# Patient Record
Sex: Male | Born: 1940
Health system: Southern US, Community
[De-identification: ages and names within clinical notes are randomized; demographics above are authoritative.]

## PROBLEM LIST (undated history)

## (undated) DIAGNOSIS — E785 Hyperlipidemia, unspecified: Secondary | ICD-10-CM

## (undated) DIAGNOSIS — Z87442 Personal history of urinary calculi: Secondary | ICD-10-CM

## (undated) DIAGNOSIS — K5790 Diverticulosis of intestine, part unspecified, without perforation or abscess without bleeding: Secondary | ICD-10-CM

## (undated) DIAGNOSIS — N189 Chronic kidney disease, unspecified: Secondary | ICD-10-CM

## (undated) DIAGNOSIS — G20 Parkinson's disease: Secondary | ICD-10-CM

## (undated) DIAGNOSIS — I2510 Atherosclerotic heart disease of native coronary artery without angina pectoris: Secondary | ICD-10-CM

## (undated) DIAGNOSIS — I10 Essential (primary) hypertension: Secondary | ICD-10-CM

## (undated) DIAGNOSIS — I454 Nonspecific intraventricular block: Secondary | ICD-10-CM

## (undated) DIAGNOSIS — F0390 Unspecified dementia without behavioral disturbance: Secondary | ICD-10-CM

## (undated) DIAGNOSIS — M1990 Unspecified osteoarthritis, unspecified site: Secondary | ICD-10-CM

## (undated) DIAGNOSIS — K219 Gastro-esophageal reflux disease without esophagitis: Secondary | ICD-10-CM

## (undated) DIAGNOSIS — R5382 Chronic fatigue, unspecified: Secondary | ICD-10-CM

## (undated) DIAGNOSIS — J439 Emphysema, unspecified: Secondary | ICD-10-CM

## (undated) DIAGNOSIS — R5381 Other malaise: Secondary | ICD-10-CM

## (undated) DIAGNOSIS — S060X9A Concussion with loss of consciousness of unspecified duration, initial encounter: Secondary | ICD-10-CM

## (undated) DIAGNOSIS — K449 Diaphragmatic hernia without obstruction or gangrene: Secondary | ICD-10-CM

## (undated) DIAGNOSIS — C801 Malignant (primary) neoplasm, unspecified: Secondary | ICD-10-CM

## (undated) DIAGNOSIS — I219 Acute myocardial infarction, unspecified: Secondary | ICD-10-CM

## (undated) DIAGNOSIS — R634 Abnormal weight loss: Secondary | ICD-10-CM

## (undated) DIAGNOSIS — F419 Anxiety disorder, unspecified: Secondary | ICD-10-CM

## (undated) DIAGNOSIS — G20A1 Parkinson's disease without dyskinesia, without mention of fluctuations: Secondary | ICD-10-CM

## (undated) HISTORY — PX: OTHER SURGICAL HISTORY: SHX169

## (undated) HISTORY — PX: CORONARY ARTERY BYPASS GRAFT: SHX141

## (undated) HISTORY — PX: LITHOTRIPSY: SUR834

## (undated) HISTORY — PX: PILONIDAL CYST / SINUS EXCISION: SUR543

## (undated) HISTORY — PX: CARDIAC CATHETERIZATION: SHX172

## (undated) HISTORY — PX: KIDNEY SURGERY: SHX687

---

## 1991-03-23 DIAGNOSIS — S060X9A Concussion with loss of consciousness of unspecified duration, initial encounter: Secondary | ICD-10-CM

## 1991-03-23 DIAGNOSIS — S060XAA Concussion with loss of consciousness status unknown, initial encounter: Secondary | ICD-10-CM

## 1991-03-23 HISTORY — DX: Concussion with loss of consciousness status unknown, initial encounter: S06.0XAA

## 1991-03-23 HISTORY — DX: Concussion with loss of consciousness of unspecified duration, initial encounter: S06.0X9A

## 2000-03-24 ENCOUNTER — Ambulatory Visit (HOSPITAL_COMMUNITY): Admission: RE | Admit: 2000-03-24 | Discharge: 2000-03-24 | Payer: Self-pay | Admitting: Family Medicine

## 2000-03-24 ENCOUNTER — Encounter: Payer: Self-pay | Admitting: Family Medicine

## 2000-05-27 ENCOUNTER — Ambulatory Visit (HOSPITAL_COMMUNITY): Admission: RE | Admit: 2000-05-27 | Discharge: 2000-05-27 | Payer: Self-pay | Admitting: Family Medicine

## 2000-05-27 ENCOUNTER — Encounter: Payer: Self-pay | Admitting: Family Medicine

## 2001-11-11 ENCOUNTER — Encounter: Payer: Self-pay | Admitting: Emergency Medicine

## 2001-11-11 ENCOUNTER — Inpatient Hospital Stay (HOSPITAL_COMMUNITY): Admission: EM | Admit: 2001-11-11 | Discharge: 2001-11-13 | Payer: Self-pay | Admitting: Emergency Medicine

## 2004-07-31 ENCOUNTER — Ambulatory Visit (HOSPITAL_COMMUNITY): Admission: RE | Admit: 2004-07-31 | Discharge: 2004-07-31 | Payer: Self-pay | Admitting: Gastroenterology

## 2006-06-09 ENCOUNTER — Ambulatory Visit (HOSPITAL_COMMUNITY): Admission: RE | Admit: 2006-06-09 | Discharge: 2006-06-09 | Payer: Self-pay | Admitting: Urology

## 2013-12-03 ENCOUNTER — Telehealth: Payer: Self-pay | Admitting: Interventional Cardiology

## 2013-12-03 NOTE — Telephone Encounter (Signed)
returned pt call. pt he does not usually ck his bp daily. pt sts that he checked it last night because he had a faint tightning in his left arm that lasted for a few minute s and then went away.pt bp on  9/13 @9pm  139/72 9/14 @8am  173/101 pt sts that he normally take his carvedilol 12.5mg  @12pm  and @ 12am Pt sts that he has had some lightheadines. Pt denies syncope, near syncope Pt denies ,sob,chest pain, swelling. Pt sts that he has had soreness on the left side of his neck x1wk Pt denies fever, chills, headache. Pt is not active and lives a sedentary lifestyle  Pt checked bp while I held on the call pt reading. 178/100 63bpm. Pt has an upcoming appt with Dr.Smith on 9/24 Adv pt to monitor his bp 1-2 hrs after medication, keep a record of reading. Pt adv to keep upcoming appt, call if bp readings are consistently elevated Adv pt I will fwd an update to Dr.Smith and call back if he has additional recommendations Pt was thankful, agreeable with plan and verbalized understanding.

## 2013-12-03 NOTE — Telephone Encounter (Signed)
New message     Pt want to know if Dr Tamala Julian can work him in today.  His bp is 173/101

## 2013-12-03 NOTE — Telephone Encounter (Signed)
FOLLOW UP:   Pt wanted to give this reading 139/72   9pm  Last night BP, pt thinks he may need to adjust his medication.  Pt stated BP has been up and Down.   Please give him a call.

## 2013-12-13 ENCOUNTER — Ambulatory Visit (INDEPENDENT_AMBULATORY_CARE_PROVIDER_SITE_OTHER): Payer: Medicare Other | Admitting: Interventional Cardiology

## 2013-12-13 ENCOUNTER — Encounter: Payer: Self-pay | Admitting: Interventional Cardiology

## 2013-12-13 VITALS — BP 140/70 | HR 68 | Resp 14 | Ht 68.0 in | Wt 192.0 lb

## 2013-12-13 DIAGNOSIS — F32A Depression, unspecified: Secondary | ICD-10-CM | POA: Insufficient documentation

## 2013-12-13 DIAGNOSIS — F17200 Nicotine dependence, unspecified, uncomplicated: Secondary | ICD-10-CM

## 2013-12-13 DIAGNOSIS — Z720 Tobacco use: Secondary | ICD-10-CM | POA: Insufficient documentation

## 2013-12-13 DIAGNOSIS — I452 Bifascicular block: Secondary | ICD-10-CM

## 2013-12-13 DIAGNOSIS — F341 Dysthymic disorder: Secondary | ICD-10-CM

## 2013-12-13 DIAGNOSIS — I25810 Atherosclerosis of coronary artery bypass graft(s) without angina pectoris: Secondary | ICD-10-CM | POA: Insufficient documentation

## 2013-12-13 DIAGNOSIS — R55 Syncope and collapse: Secondary | ICD-10-CM

## 2013-12-13 DIAGNOSIS — F419 Anxiety disorder, unspecified: Secondary | ICD-10-CM

## 2013-12-13 DIAGNOSIS — F329 Major depressive disorder, single episode, unspecified: Secondary | ICD-10-CM | POA: Insufficient documentation

## 2013-12-13 DIAGNOSIS — I10 Essential (primary) hypertension: Secondary | ICD-10-CM

## 2013-12-13 MED ORDER — CARVEDILOL 12.5 MG PO TABS: 12.5000 mg | ORAL_TABLET | Freq: Every day | ORAL | Status: DC

## 2013-12-13 MED ORDER — NITROGLYCERIN 0.4 MG SL SUBL: 0.4000 mg | SUBLINGUAL_TABLET | SUBLINGUAL | Status: AC | PRN

## 2013-12-13 NOTE — Progress Notes (Signed)
Patient ID: Douglas Clark, male   DOB: 04/06/1940, 73 y.o.   MRN: [id number]    [address], [address], [place]  [address] Phone: [phone] Fax:  [phone]  Date:  12/13/2013   ID:  DOCTOR SHEAHAN, DOB 20-Mar-1941, MRN [id number]  PCP:  No primary provider on file.   ASSESSMENT:  1. Near Syncope, recurrent. Rule out transient high grade AV block 2. Bifascicular block 3. CAD with prior bypass but no symptoms of angina 4. Hypertension 5. Tobacco abuse  PLAN:  1. Thirty-day continuous ambulatory monitor to rule out high-grade AV block/ventricular arrhythmias as the source of recurrent syncope 2. Stop smoking 3. Adjustments in medication and our pacemaker therapy may be required if abnormalities are found 4. Otherwise clinical followup in one year   SUBJECTIVE: Douglas Clark is a 73 y.o. male who is doing relatively well except for the past month he has had multiple episodes of suddenly becoming weak and feeling as though his vision is graying. These episodes occur at random. They're not associated with change in posture. He can feel weak for up to 30 minutes. Is no chest pain, palpitations, headache, or neurological symptoms. When the episodes pass the dose back to feeling normal.   Wt Readings from Last 3 Encounters:  12/13/13 192 lb (87.091 kg)     No past medical history on file.  Current Outpatient Prescriptions  Medication Sig Dispense Refill  . carvedilol (COREG) 12.5 MG tablet Take 12.5 mg by mouth daily.      Marland Kitchen HYDROcodone-acetaminophen (NORCO/VICODIN) 5-325 MG per tablet Take 1 tablet by mouth every 6 (six) hours as needed for moderate pain.      . nitroGLYCERIN (NITROSTAT) 0.4 MG SL tablet Place 0.4 mg under the tongue every 5 (five) minutes as needed for chest pain.      . simvastatin (ZOCOR) 20 MG tablet Take 20 mg by mouth daily.      . tamsulosin (FLOMAX) 0.4 MG CAPS capsule Take 0.4 mg by mouth.      . zolpidem (AMBIEN) 10 MG tablet Take  10 mg by mouth at bedtime as needed for sleep.       No current facility-administered medications for this visit.    Allergies:    Allergies  Allergen Reactions  . Doxazosin   . Hctz [Hydrochlorothiazide]   . Trazodone And Nefazodone     Social History:  The patient  reports that he has been smoking.  He does not have any smokeless tobacco history on file.   ROS:  Please see the history of present illness.   No transient neurological symptoms. Denies wheezing and orthopnea. No edema.   All other systems reviewed and negative.   OBJECTIVE: VS:  BP 140/70  Pulse 68  Wt 192 lb (87.091 kg) Well nourished, well developed, in no acute distress, older than stated age 64: normal Neck: JVD flat. Carotid bruit absent  Cardiac:  normal S1, S2; RRR; no murmur Lungs:  clear to auscultation bilaterally, no wheezing, rhonchi or rales Abd: soft, nontender, no hepatomegaly Ext: Edema absent. Pulses 2+ Skin: warm and dry Neuro:  CNs 2-12 intact, no focal abnormalities noted  EKG:  Sinus rhythm with bifascicular block with right bundle and left anterior hemiblock       Signed, Illene Labrador III, MD 12/13/2013 2:33 PM

## 2013-12-13 NOTE — Patient Instructions (Signed)
Your physician recommends that you continue on your current medications as directed. Please refer to the Current Medication list given to you today.  Your physician has recommended that you wear an event monitor. Event monitors are medical devices that record the heart's electrical activity. Doctors most often [place] these monitors to diagnose arrhythmias. Arrhythmias are problems with the speed or rhythm of the heartbeat. The monitor is a small, portable device. You can wear one while you do your normal daily activities. This is usually used to diagnose what is causing palpitations/syncope (passing out).   Your physician wants you to follow-up in: 1 year with Dr.Smith You will receive a reminder letter in the mail two months in advance. If you don't receive a letter, please call our office to schedule the follow-up appointment.

## 2013-12-17 ENCOUNTER — Other Ambulatory Visit: Payer: Self-pay

## 2013-12-17 MED ORDER — CARVEDILOL 12.5 MG PO TABS: 12.5000 mg | ORAL_TABLET | Freq: Two times a day (BID) | ORAL | Status: AC

## 2013-12-18 ENCOUNTER — Encounter (INDEPENDENT_AMBULATORY_CARE_PROVIDER_SITE_OTHER): Payer: Medicare Other

## 2013-12-18 ENCOUNTER — Encounter: Payer: Self-pay | Admitting: *Deleted

## 2013-12-18 DIAGNOSIS — R55 Syncope and collapse: Secondary | ICD-10-CM

## 2013-12-18 NOTE — Progress Notes (Signed)
Patient ID: Douglas Clark, male   DOB: 06-06-1940, 73 y.o.   MRN: [id number] [employer] 30 day cardiac event monitor applied to patient.

## 2014-01-01 NOTE — Telephone Encounter (Signed)
Pt seen by dr Tamala Julian 12/13/13/close note/tmj

## 2014-01-25 ENCOUNTER — Telehealth: Payer: Self-pay | Admitting: Interventional Cardiology

## 2014-01-25 NOTE — Telephone Encounter (Signed)
Pt aware of cardiac monitor results -No arrhythmia to explain syncope Pt verbalized understanding.

## 2014-01-25 NOTE — Telephone Encounter (Signed)
New message     Want monitor results 

## 2014-01-31 ENCOUNTER — Other Ambulatory Visit: Payer: Self-pay | Admitting: Family Medicine

## 2014-01-31 ENCOUNTER — Ambulatory Visit
Admission: RE | Admit: 2014-01-31 | Discharge: 2014-01-31 | Disposition: A | Discharge status: Home / Self Care | Payer: Medicare Other | Source: Ambulatory Visit | Attending: Family Medicine | Admitting: Family Medicine

## 2014-01-31 DIAGNOSIS — M542 Cervicalgia: Secondary | ICD-10-CM

## 2014-09-16 ENCOUNTER — Other Ambulatory Visit: Payer: Self-pay

## 2014-12-24 ENCOUNTER — Other Ambulatory Visit: Payer: Self-pay | Admitting: Internal Medicine

## 2014-12-24 DIAGNOSIS — M5137 Other intervertebral disc degeneration, lumbosacral region: Secondary | ICD-10-CM

## 2015-01-01 ENCOUNTER — Ambulatory Visit
Admission: RE | Admit: 2015-01-01 | Discharge: 2015-01-01 | Disposition: A | Discharge status: Home / Self Care | Payer: Medicare Other | Source: Ambulatory Visit | Attending: Internal Medicine | Admitting: Internal Medicine

## 2015-01-01 DIAGNOSIS — M2578 Osteophyte, vertebrae: Secondary | ICD-10-CM | POA: Diagnosis not present

## 2015-01-01 DIAGNOSIS — M47896 Other spondylosis, lumbar region: Secondary | ICD-10-CM | POA: Diagnosis not present

## 2015-01-01 DIAGNOSIS — M4806 Spinal stenosis, lumbar region: Secondary | ICD-10-CM | POA: Diagnosis not present

## 2015-01-01 DIAGNOSIS — M5137 Other intervertebral disc degeneration, lumbosacral region: Secondary | ICD-10-CM | POA: Insufficient documentation

## 2015-01-01 DIAGNOSIS — M4807 Spinal stenosis, lumbosacral region: Secondary | ICD-10-CM | POA: Insufficient documentation

## 2015-01-06 ENCOUNTER — Other Ambulatory Visit (HOSPITAL_COMMUNITY): Payer: Self-pay | Admitting: Neurosurgery

## 2015-01-28 ENCOUNTER — Encounter (HOSPITAL_COMMUNITY)
Admission: RE | Admit: 2015-01-28 | Discharge: 2015-01-28 | Disposition: A | Discharge status: Home / Self Care | Payer: Medicare Other | Source: Ambulatory Visit | Attending: Neurosurgery | Admitting: Neurosurgery

## 2015-01-28 ENCOUNTER — Encounter (HOSPITAL_COMMUNITY): Payer: Self-pay

## 2015-01-28 DIAGNOSIS — M4806 Spinal stenosis, lumbar region: Secondary | ICD-10-CM | POA: Insufficient documentation

## 2015-01-28 DIAGNOSIS — Z01818 Encounter for other preprocedural examination: Secondary | ICD-10-CM | POA: Diagnosis not present

## 2015-01-28 HISTORY — DX: Emphysema, unspecified: J43.9

## 2015-01-28 HISTORY — DX: Personal history of urinary calculi: Z87.442

## 2015-01-28 HISTORY — DX: Acute myocardial infarction, unspecified: I21.9

## 2015-01-28 HISTORY — DX: Gastro-esophageal reflux disease without esophagitis: K21.9

## 2015-01-28 HISTORY — DX: Anxiety disorder, unspecified: F41.9

## 2015-01-28 HISTORY — DX: Essential (primary) hypertension: I10

## 2015-01-28 LAB — BASIC METABOLIC PANEL
Anion gap: 8 (ref 5–15)
BUN: 10 mg/dL (ref 6–20)
CO2: 29 mmol/L (ref 22–32)
CREATININE: 0.89 mg/dL (ref 0.61–1.24)
Calcium: 9.6 mg/dL (ref 8.9–10.3)
Chloride: 103 mmol/L (ref 101–111)
Glucose, Bld: 102 mg/dL — ABNORMAL HIGH (ref 65–99)
Potassium: 4.5 mmol/L (ref 3.5–5.1)
SODIUM: 140 mmol/L (ref 135–145)

## 2015-01-28 LAB — CBC
HEMATOCRIT: 52.2 % — AB (ref 39.0–52.0)
Hemoglobin: 18.5 g/dL — ABNORMAL HIGH (ref 13.0–17.0)
MCH: 34.6 pg — ABNORMAL HIGH (ref 26.0–34.0)
MCHC: 35.4 g/dL (ref 30.0–36.0)
MCV: 97.6 fL (ref 78.0–100.0)
PLATELETS: UNDETERMINED 10*3/uL (ref 150–400)
RBC: 5.35 MIL/uL (ref 4.22–5.81)
RDW: 12.7 % (ref 11.5–15.5)
WBC: 7.2 10*3/uL (ref 4.0–10.5)

## 2015-01-28 LAB — SURGICAL PCR SCREEN
MRSA, PCR: NEGATIVE
Staphylococcus aureus: NEGATIVE

## 2015-01-28 NOTE — Progress Notes (Signed)
REQUESTED STRESS TEST, ECHO, EKG, OV FROM DR. Clayton.

## 2015-01-29 ENCOUNTER — Other Ambulatory Visit: Payer: Self-pay | Admitting: Internal Medicine

## 2015-01-29 DIAGNOSIS — R221 Localized swelling, mass and lump, neck: Secondary | ICD-10-CM

## 2015-01-29 NOTE — Progress Notes (Addendum)
Anesthesia Chart Review: Patient is a 74 year old male scheduled for L2-3, L3-4, L4-5, L5-S1 laminectomy and foraminotomy on 02/04/15 by Dr. Joya Salm.  History includes smoking, CAD/MI s/p CABG '00, HTN, emphysema, anxiety, GERD. PCP is Dr. Fulton Reek, newly established 11/20/14.  Cardiologist is Dr. Ubaldo Glassing [facility])--previously saw Dr. Daneen Schick. Last visit 10/31/14 to get established. No new symptoms. Six month follow-up recommended.  Meds include ASA, Coreg, Klonopin, Nexium, Nitro, Zocor.  01/28/15 EKG: NSR, right BBB, LAFB, bifascicular block, T wave abnormality, consider lateral ischemia. T wave abnormality is more prominent in V1-3, but overall stable when compared to tracing from 12/13/13.  11/28/14 Echo (Care Everywhere): INTERPRETATION Closest EF: >55% (Estimated) LVH: MILD LVH Aortic: TRIVIAL AR Mitral: TRIVIAL MR Tricuspid: TRIVIAL TR  No stress test seen in Care Everywhere. Requested old stress test from Dr. Thompson Caul office, as available. No recent stress test reported--or seen in Epic.   12/2013 30 day event monitor: No arrhythmia to explain syncope.  Preoperative labs noted. PLT clumped (126K on 11/20/14, Care Everywhere). Will recheck DOS.   Patient with known CAD, ongoing tobacco abuse. He has had recent cardiology follow-up, but unclear of when he had his last stress test. EF okay on recent echo. Recommend preoperative cardiology input. Notified Jessica at Dr. Harley Hallmark office. She will follow-up with Dr. Bethanne Ginger office.  George Hugh [facility] Stay Center/Anesthesiology Phone [phone] 01/29/2015 3:14 PM  Addendum: 08/17/11 Nuclear stress test report received from the formed [facility]. Results showed: A moderate to severe perfusion defect is seen in the basal inferolateral and mid inferolateral regions. This is consistent with infarct/scar. There is no scintigraphic evidence of inducible myocardial ischemia. Post stress EF 53%. Wall motion  abnormality noted, mild hypokinesis in the basal inferior, basal inferolateral and mid inferolateral regions. Exercise capacity 10.0 METS. Abnormal Myocardial Perfusion Scan. This was essentially a low risk scan.    Awaiting input from Dr. Ubaldo Glassing.  George Hugh [facility] Short Stay Center/Anesthesiology Phone [phone] 01/29/2015 4:06 PM  Addendum: Dr. Ubaldo Glassing ordered a pre-operative nuclear stress test which was done today. Results showed (see Care Everywhere): Negative Lexiscan stress.  Ejection fraction 47%.  No evidence of reversible ischemia. Normal myocardial thickening and wall motion.   George Hugh [facility] Short Stay Center/Anesthesiology Phone [phone] 02/03/2015 4:16 PM

## 2015-02-03 ENCOUNTER — Encounter: Admission: RE | Payer: Self-pay | Source: Ambulatory Visit

## 2015-02-03 ENCOUNTER — Ambulatory Visit: Admission: RE | Admit: 2015-02-03 | Payer: Medicare Other | Source: Ambulatory Visit | Admitting: Gastroenterology

## 2015-02-03 SURGERY — COLONOSCOPY WITH PROPOFOL
Anesthesia: General

## 2015-02-03 MED ORDER — CEFAZOLIN SODIUM-DEXTROSE 2-3 GM-% IV SOLR
2.0000 g | INTRAVENOUS | Status: AC
  Administered 2015-02-04: 2 g via INTRAVENOUS
  Filled 2015-02-03: qty 50

## 2015-02-03 NOTE — H&P (Signed)
Douglas Clark is an 74 y.o. male.  Main complain.  Leg pain Chief Complaint: patient complaining of lumbar pain with radiation to both legs for many years up to the point than when he came to see me he was dragging both feet. Mri was obtained and send to [place] for further treatment   Past Medical History  Diagnosis Date  . Myocardial infarction ([place])   . Hypertension   . Emphysema lung ([place])   . Anxiety   . History of kidney stones   . GERD (gastroesophageal reflux disease)     Past Surgical History  Procedure Laterality Date  . Coronary artery bypass graft      2000      . Kidney surgery      REMOVED STONE FROM RT KIDNEY  . Lithotripsy      X2     Family History  Problem Relation Age of Onset  . Heart disease Father    Social History:  reports that he has been smoking.  He does not have any smokeless tobacco history on file. He reports that he drinks alcohol. He reports that he does not use illicit drugs.  Allergies:  Allergies  Allergen Reactions  . Doxazosin Hives  . Trazodone And Nefazodone Other (See Comments)    shivers  . Hctz [Hydrochlorothiazide] Rash    blisters    No prescriptions prior to admission    No results found for this or any previous visit (from the past 48 hour(s)). No results found.  Review of Systems  Constitutional: Negative.   HENT: Negative.   Eyes: Negative.   Respiratory: Negative.   Cardiovascular: Negative.   Gastrointestinal: Negative.   Genitourinary: Negative.   Musculoskeletal: Positive for back pain.  Skin: Negative.   Neurological: Positive for sensory change and focal weakness.  Endo/Heme/Allergies: Negative.   Psychiatric/Behavioral: Positive for memory loss.    There were no vitals taken for this visit. Physical Exam hent, nl. Neck, nl. Cv,  Midline scar in the chest nl. Lungs, some ronchii. Abdomen, nl. Extremities, grade 1 edema surgical scars. NEURO weakness of DF both feet and weaknerss of the quadriceps.  Sensory, nl. dtr  1 plus. SLR positive at 60 degrees. Femoral stretch maneuver bilaterally. Mri and x-ray showed DDD at multiple levels with stenosis from l2 to l5-s1 witn a stable step-off at l2-3.   Assessment/Plan patient to go ahead with decompression from le2 to l5-s1. Decision about PLA will be made during surgery specially al l2-3. He is aware of risks and benefits  Rodderick Holtzer M 02/03/2015, 5:53 PM

## 2015-02-04 ENCOUNTER — Encounter (HOSPITAL_COMMUNITY)
Admission: RE | Disposition: A | Discharge status: Skilled Nursing Facility | Payer: Medicare Other | Source: Ambulatory Visit | Attending: Neurosurgery

## 2015-02-04 ENCOUNTER — Inpatient Hospital Stay (HOSPITAL_COMMUNITY): Payer: Medicare Other | Admitting: Anesthesiology

## 2015-02-04 ENCOUNTER — Inpatient Hospital Stay (HOSPITAL_COMMUNITY)
Admission: RE | Admit: 2015-02-04 | Discharge: 2015-02-10 | DRG: 460 | Disposition: A | Discharge status: Skilled Nursing Facility | Payer: Medicare Other | Source: Ambulatory Visit | Attending: Neurosurgery | Admitting: Neurosurgery

## 2015-02-04 ENCOUNTER — Inpatient Hospital Stay (HOSPITAL_COMMUNITY): Payer: Medicare Other | Admitting: Vascular Surgery

## 2015-02-04 ENCOUNTER — Encounter (HOSPITAL_COMMUNITY): Payer: Self-pay | Admitting: Surgery

## 2015-02-04 ENCOUNTER — Inpatient Hospital Stay (HOSPITAL_COMMUNITY): Payer: Medicare Other

## 2015-02-04 DIAGNOSIS — M4807 Spinal stenosis, lumbosacral region: Secondary | ICD-10-CM | POA: Diagnosis present

## 2015-02-04 DIAGNOSIS — F419 Anxiety disorder, unspecified: Secondary | ICD-10-CM | POA: Diagnosis present

## 2015-02-04 DIAGNOSIS — Z888 Allergy status to other drugs, medicaments and biological substances status: Secondary | ICD-10-CM

## 2015-02-04 DIAGNOSIS — J439 Emphysema, unspecified: Secondary | ICD-10-CM | POA: Diagnosis present

## 2015-02-04 DIAGNOSIS — Z8249 Family history of ischemic heart disease and other diseases of the circulatory system: Secondary | ICD-10-CM

## 2015-02-04 DIAGNOSIS — R413 Other amnesia: Secondary | ICD-10-CM | POA: Diagnosis present

## 2015-02-04 DIAGNOSIS — Z7982 Long term (current) use of aspirin: Secondary | ICD-10-CM

## 2015-02-04 DIAGNOSIS — M4806 Spinal stenosis, lumbar region: Principal | ICD-10-CM | POA: Diagnosis present

## 2015-02-04 DIAGNOSIS — I2510 Atherosclerotic heart disease of native coronary artery without angina pectoris: Secondary | ICD-10-CM | POA: Diagnosis present

## 2015-02-04 DIAGNOSIS — I10 Essential (primary) hypertension: Secondary | ICD-10-CM | POA: Diagnosis present

## 2015-02-04 DIAGNOSIS — Z951 Presence of aortocoronary bypass graft: Secondary | ICD-10-CM

## 2015-02-04 DIAGNOSIS — M5117 Intervertebral disc disorders with radiculopathy, lumbosacral region: Secondary | ICD-10-CM | POA: Diagnosis present

## 2015-02-04 DIAGNOSIS — I252 Old myocardial infarction: Secondary | ICD-10-CM

## 2015-02-04 DIAGNOSIS — K5900 Constipation, unspecified: Secondary | ICD-10-CM | POA: Diagnosis not present

## 2015-02-04 DIAGNOSIS — K219 Gastro-esophageal reflux disease without esophagitis: Secondary | ICD-10-CM | POA: Diagnosis present

## 2015-02-04 DIAGNOSIS — Z419 Encounter for procedure for purposes other than remedying health state, unspecified: Secondary | ICD-10-CM

## 2015-02-04 DIAGNOSIS — M48062 Spinal stenosis, lumbar region with neurogenic claudication: Secondary | ICD-10-CM | POA: Diagnosis present

## 2015-02-04 DIAGNOSIS — F17210 Nicotine dependence, cigarettes, uncomplicated: Secondary | ICD-10-CM | POA: Diagnosis present

## 2015-02-04 DIAGNOSIS — M5116 Intervertebral disc disorders with radiculopathy, lumbar region: Secondary | ICD-10-CM | POA: Diagnosis present

## 2015-02-04 HISTORY — DX: Diaphragmatic hernia without obstruction or gangrene: K44.9

## 2015-02-04 HISTORY — DX: Other malaise: R53.81

## 2015-02-04 HISTORY — DX: Concussion with loss of consciousness of unspecified duration, initial encounter: S06.0X9A

## 2015-02-04 HISTORY — DX: Chronic fatigue, unspecified: R53.82

## 2015-02-04 HISTORY — PX: LUMBAR LAMINECTOMY/DECOMPRESSION MICRODISCECTOMY: SHX5026

## 2015-02-04 LAB — CBC
HCT: 47.9 % (ref 39.0–52.0)
Hemoglobin: 16.8 g/dL (ref 13.0–17.0)
MCH: 34.6 pg — AB (ref 26.0–34.0)
MCHC: 35.1 g/dL (ref 30.0–36.0)
MCV: 98.6 fL (ref 78.0–100.0)
Platelets: 95 10*3/uL — ABNORMAL LOW (ref 150–400)
RBC: 4.86 MIL/uL (ref 4.22–5.81)
RDW: 12.8 % (ref 11.5–15.5)
WBC: 6.3 10*3/uL (ref 4.0–10.5)

## 2015-02-04 SURGERY — LUMBAR LAMINECTOMY/DECOMPRESSION MICRODISCECTOMY 4 LEVEL
Anesthesia: General | Laterality: Bilateral

## 2015-02-04 MED ORDER — MORPHINE SULFATE 2 MG/ML IV SOLN
INTRAVENOUS | Status: AC
  Filled 2015-02-04: qty 25

## 2015-02-04 MED ORDER — ACETAMINOPHEN 650 MG RE SUPP: 650.0000 mg | RECTAL | Status: DC | PRN

## 2015-02-04 MED ORDER — CARVEDILOL 12.5 MG PO TABS
12.5000 mg | ORAL_TABLET | Freq: Two times a day (BID) | ORAL | Status: DC
  Administered 2015-02-04 – 2015-02-10 (×12): 12.5 mg via ORAL
  Filled 2015-02-04 (×12): qty 1

## 2015-02-04 MED ORDER — MORPHINE SULFATE 2 MG/ML IV SOLN
INTRAVENOUS | Status: DC
  Administered 2015-02-04: 16:00:00 via INTRAVENOUS
  Administered 2015-02-04: 3 mg via INTRAVENOUS
  Administered 2015-02-05: 08:00:00 via INTRAVENOUS
  Administered 2015-02-05: 8 mg via INTRAVENOUS
  Administered 2015-02-05: 16 mg via INTRAVENOUS
  Administered 2015-02-05: 11.19 mg via INTRAVENOUS
  Filled 2015-02-04: qty 25

## 2015-02-04 MED ORDER — FENTANYL CITRATE (PF) 250 MCG/5ML IJ SOLN
INTRAMUSCULAR | Status: AC
  Filled 2015-02-04: qty 5

## 2015-02-04 MED ORDER — LACTATED RINGERS IV SOLN
INTRAVENOUS | Status: DC
  Administered 2015-02-04: 11:00:00 via INTRAVENOUS

## 2015-02-04 MED ORDER — LACTATED RINGERS IV SOLN
INTRAVENOUS | Status: DC | PRN
  Administered 2015-02-04 (×2): via INTRAVENOUS

## 2015-02-04 MED ORDER — HEMOSTATIC AGENTS (NO CHARGE) OPTIME
TOPICAL | Status: DC | PRN
  Administered 2015-02-04 (×2): 1 via TOPICAL

## 2015-02-04 MED ORDER — THROMBIN 5000 UNITS EX SOLR
CUTANEOUS | Status: DC | PRN
  Administered 2015-02-04: 5000 [IU] via TOPICAL

## 2015-02-04 MED ORDER — NEOSTIGMINE METHYLSULFATE 10 MG/10ML IV SOLN
INTRAVENOUS | Status: AC
  Filled 2015-02-04: qty 1

## 2015-02-04 MED ORDER — DIAZEPAM 5 MG PO TABS
5.0000 mg | ORAL_TABLET | Freq: Four times a day (QID) | ORAL | Status: DC | PRN
  Administered 2015-02-04 – 2015-02-08 (×4): 5 mg via ORAL
  Filled 2015-02-04 (×5): qty 1

## 2015-02-04 MED ORDER — HYDROMORPHONE HCL 1 MG/ML IJ SOLN
0.2500 mg | INTRAMUSCULAR | Status: DC | PRN
  Administered 2015-02-04 (×2): 0.5 mg via INTRAVENOUS

## 2015-02-04 MED ORDER — ASPIRIN 81 MG PO CHEW
81.0000 mg | CHEWABLE_TABLET | Freq: Every day | ORAL | Status: DC
  Administered 2015-02-04 – 2015-02-10 (×7): 81 mg via ORAL
  Filled 2015-02-04 (×7): qty 1

## 2015-02-04 MED ORDER — PHENOL 1.4 % MT LIQD
1.0000 | OROMUCOSAL | Status: DC | PRN
  Administered 2015-02-04: 1 via OROMUCOSAL
  Filled 2015-02-04: qty 177

## 2015-02-04 MED ORDER — MIDAZOLAM HCL 2 MG/2ML IJ SOLN
INTRAMUSCULAR | Status: AC
  Filled 2015-02-04: qty 4

## 2015-02-04 MED ORDER — VANCOMYCIN HCL 1000 MG IV SOLR
INTRAVENOUS | Status: DC | PRN
  Administered 2015-02-04 (×2): 1000 mg

## 2015-02-04 MED ORDER — CARVEDILOL 12.5 MG PO TABS: 12.5000 mg | ORAL_TABLET | Freq: Two times a day (BID) | ORAL | Status: DC

## 2015-02-04 MED ORDER — SODIUM CHLORIDE 0.9 % IV SOLN: 250.0000 mL | INTRAVENOUS | Status: DC

## 2015-02-04 MED ORDER — ZOLPIDEM TARTRATE 5 MG PO TABS: 5.0000 mg | ORAL_TABLET | Freq: Every evening | ORAL | Status: DC | PRN

## 2015-02-04 MED ORDER — DIPHENHYDRAMINE HCL 50 MG/ML IJ SOLN: 12.5000 mg | Freq: Four times a day (QID) | INTRAMUSCULAR | Status: DC | PRN

## 2015-02-04 MED ORDER — CEFAZOLIN SODIUM 1-5 GM-% IV SOLN
1.0000 g | Freq: Three times a day (TID) | INTRAVENOUS | Status: AC
  Administered 2015-02-04 – 2015-02-05 (×2): 1 g via INTRAVENOUS
  Filled 2015-02-04 (×2): qty 50

## 2015-02-04 MED ORDER — VANCOMYCIN HCL 1000 MG IV SOLR
INTRAVENOUS | Status: AC
  Filled 2015-02-04: qty 2000

## 2015-02-04 MED ORDER — MENTHOL 3 MG MT LOZG
1.0000 | LOZENGE | OROMUCOSAL | Status: DC | PRN
  Administered 2015-02-06: 3 mg via ORAL

## 2015-02-04 MED ORDER — ONDANSETRON HCL 4 MG/2ML IJ SOLN
4.0000 mg | INTRAMUSCULAR | Status: DC | PRN
  Administered 2015-02-05: 4 mg via INTRAVENOUS
  Filled 2015-02-04: qty 2

## 2015-02-04 MED ORDER — ROCURONIUM BROMIDE 100 MG/10ML IV SOLN
INTRAVENOUS | Status: DC | PRN
  Administered 2015-02-04: 30 mg via INTRAVENOUS
  Administered 2015-02-04: 10 mg via INTRAVENOUS

## 2015-02-04 MED ORDER — ACETAMINOPHEN 325 MG PO TABS
650.0000 mg | ORAL_TABLET | ORAL | Status: DC | PRN
  Administered 2015-02-07: 650 mg via ORAL
  Filled 2015-02-04: qty 2

## 2015-02-04 MED ORDER — SIMVASTATIN 20 MG PO TABS
20.0000 mg | ORAL_TABLET | Freq: Every day | ORAL | Status: DC
  Administered 2015-02-04 – 2015-02-09 (×6): 20 mg via ORAL
  Filled 2015-02-04 (×5): qty 1

## 2015-02-04 MED ORDER — SODIUM CHLORIDE 0.9 % IJ SOLN: 9.0000 mL | INTRAMUSCULAR | Status: DC | PRN

## 2015-02-04 MED ORDER — GLYCOPYRROLATE 0.2 MG/ML IJ SOLN
INTRAMUSCULAR | Status: AC
  Filled 2015-02-04: qty 2

## 2015-02-04 MED ORDER — SODIUM CHLORIDE 0.9 % IJ SOLN: 3.0000 mL | INTRAMUSCULAR | Status: DC | PRN

## 2015-02-04 MED ORDER — CLONAZEPAM 2 MG PO TABS: 2.0000 mg | ORAL_TABLET | Freq: Every day | ORAL | Status: DC

## 2015-02-04 MED ORDER — ONDANSETRON HCL 4 MG/2ML IJ SOLN: 4.0000 mg | Freq: Four times a day (QID) | INTRAMUSCULAR | Status: DC | PRN

## 2015-02-04 MED ORDER — ARTIFICIAL TEARS OP OINT
TOPICAL_OINTMENT | OPHTHALMIC | Status: DC | PRN
  Administered 2015-02-04: 1 via OPHTHALMIC

## 2015-02-04 MED ORDER — ONDANSETRON HCL 4 MG/2ML IJ SOLN: 4.0000 mg | Freq: Once | INTRAMUSCULAR | Status: DC | PRN

## 2015-02-04 MED ORDER — ARTIFICIAL TEARS OP OINT
TOPICAL_OINTMENT | OPHTHALMIC | Status: AC
  Filled 2015-02-04: qty 7

## 2015-02-04 MED ORDER — ONDANSETRON HCL 4 MG/2ML IJ SOLN
INTRAMUSCULAR | Status: DC | PRN
  Administered 2015-02-04: 4 mg via INTRAVENOUS

## 2015-02-04 MED ORDER — POLYETHYLENE GLYCOL 3350 17 G PO PACK: 17.0000 g | PACK | Freq: Every day | ORAL | Status: DC | PRN

## 2015-02-04 MED ORDER — NITROGLYCERIN 0.4 MG SL SUBL: 0.4000 mg | SUBLINGUAL_TABLET | SUBLINGUAL | Status: DC | PRN

## 2015-02-04 MED ORDER — ONDANSETRON HCL 4 MG/2ML IJ SOLN
INTRAMUSCULAR | Status: AC
  Filled 2015-02-04: qty 2

## 2015-02-04 MED ORDER — NEOSTIGMINE METHYLSULFATE 10 MG/10ML IV SOLN
INTRAVENOUS | Status: DC | PRN
  Administered 2015-02-04: 3 mg via INTRAVENOUS

## 2015-02-04 MED ORDER — SUCCINYLCHOLINE CHLORIDE 20 MG/ML IJ SOLN
INTRAMUSCULAR | Status: DC | PRN
  Administered 2015-02-04: 100 mg via INTRAVENOUS

## 2015-02-04 MED ORDER — SODIUM CHLORIDE 0.9 % IJ SOLN
3.0000 mL | Freq: Two times a day (BID) | INTRAMUSCULAR | Status: DC
  Administered 2015-02-06 – 2015-02-09 (×6): 3 mL via INTRAVENOUS

## 2015-02-04 MED ORDER — DIPHENHYDRAMINE HCL 12.5 MG/5ML PO ELIX: 12.5000 mg | ORAL_SOLUTION | Freq: Four times a day (QID) | ORAL | Status: DC | PRN

## 2015-02-04 MED ORDER — GLYCOPYRROLATE 0.2 MG/ML IJ SOLN
INTRAMUSCULAR | Status: DC | PRN
  Administered 2015-02-04: 0.4 mg via INTRAVENOUS

## 2015-02-04 MED ORDER — PROPOFOL 10 MG/ML IV BOLUS
INTRAVENOUS | Status: DC | PRN
  Administered 2015-02-04: 200 mg via INTRAVENOUS

## 2015-02-04 MED ORDER — CLONAZEPAM 1 MG PO TABS
2.0000 mg | ORAL_TABLET | Freq: Every day | ORAL | Status: DC
  Administered 2015-02-04 – 2015-02-09 (×6): 2 mg via ORAL
  Filled 2015-02-04 (×6): qty 2

## 2015-02-04 MED ORDER — 0.9 % SODIUM CHLORIDE (POUR BTL) OPTIME
TOPICAL | Status: DC | PRN
  Administered 2015-02-04: 1000 mL

## 2015-02-04 MED ORDER — NALOXONE HCL 0.4 MG/ML IJ SOLN: 0.4000 mg | INTRAMUSCULAR | Status: DC | PRN

## 2015-02-04 MED ORDER — LIDOCAINE HCL (CARDIAC) 20 MG/ML IV SOLN
INTRAVENOUS | Status: DC | PRN
  Administered 2015-02-04: 100 mg via INTRAVENOUS

## 2015-02-04 MED ORDER — CARVEDILOL 12.5 MG PO TABS: 12.5000 mg | ORAL_TABLET | Freq: Once | ORAL | Status: DC

## 2015-02-04 MED ORDER — SODIUM CHLORIDE 0.9 % IV SOLN
INTRAVENOUS | Status: DC
  Administered 2015-02-05: 75 mL/h via INTRAVENOUS

## 2015-02-04 MED ORDER — OXYCODONE-ACETAMINOPHEN 5-325 MG PO TABS
1.0000 | ORAL_TABLET | ORAL | Status: DC | PRN
  Administered 2015-02-04 – 2015-02-06 (×4): 2 via ORAL
  Administered 2015-02-06: 1 via ORAL
  Administered 2015-02-06 – 2015-02-08 (×4): 2 via ORAL
  Administered 2015-02-08: 1 via ORAL
  Administered 2015-02-08 (×2): 2 via ORAL
  Administered 2015-02-09 (×2): 1 via ORAL
  Administered 2015-02-09: 2 via ORAL
  Administered 2015-02-09 – 2015-02-10 (×3): 1 via ORAL
  Filled 2015-02-04: qty 1
  Filled 2015-02-04 (×2): qty 2
  Filled 2015-02-04: qty 1
  Filled 2015-02-04: qty 2
  Filled 2015-02-04: qty 1
  Filled 2015-02-04 (×2): qty 2
  Filled 2015-02-04: qty 1
  Filled 2015-02-04: qty 2
  Filled 2015-02-04: qty 1
  Filled 2015-02-04 (×2): qty 2
  Filled 2015-02-04: qty 1
  Filled 2015-02-04 (×3): qty 2
  Filled 2015-02-04: qty 1
  Filled 2015-02-04 (×2): qty 2

## 2015-02-04 MED ORDER — EPHEDRINE SULFATE 50 MG/ML IJ SOLN
INTRAMUSCULAR | Status: DC | PRN
  Administered 2015-02-04 (×4): 10 mg via INTRAVENOUS

## 2015-02-04 MED ORDER — FENTANYL CITRATE (PF) 100 MCG/2ML IJ SOLN
INTRAMUSCULAR | Status: DC | PRN
  Administered 2015-02-04 (×6): 50 ug via INTRAVENOUS

## 2015-02-04 MED ORDER — HYDROMORPHONE HCL 1 MG/ML IJ SOLN
INTRAMUSCULAR | Status: AC
  Filled 2015-02-04: qty 1

## 2015-02-04 SURGICAL SUPPLY — 58 items
APL SKNCLS STERI-STRIP NONHPOA (GAUZE/BANDAGES/DRESSINGS) ×1
BENZOIN TINCTURE PRP APPL 2/3 (GAUZE/BANDAGES/DRESSINGS) ×3 IMPLANT
BLADE CLIPPER SURG (BLADE) IMPLANT
BUR ACORN 6.0 (BURR) ×2 IMPLANT
BUR ACORN 6.0MM (BURR) ×2
BUR MATCHSTICK NEURO 3.0 LAGG (BURR) ×3 IMPLANT
CANISTER SUCT 3000ML PPV (MISCELLANEOUS) ×3 IMPLANT
CLOSURE WOUND 1/2 X4 (GAUZE/BANDAGES/DRESSINGS) ×1
DRAPE LAPAROTOMY 100X72X124 (DRAPES) ×3 IMPLANT
DRAPE MICROSCOPE LEICA (MISCELLANEOUS) ×1 IMPLANT
DRAPE POUCH INSTRU U-SHP 10X18 (DRAPES) ×3 IMPLANT
DRSG OPSITE POSTOP 4X10 (GAUZE/BANDAGES/DRESSINGS) ×2 IMPLANT
DRSG PAD ABDOMINAL 8X10 ST (GAUZE/BANDAGES/DRESSINGS) IMPLANT
DRSG TELFA 3X8 NADH (GAUZE/BANDAGES/DRESSINGS) ×3 IMPLANT
DURAPREP 26ML APPLICATOR (WOUND CARE) ×3 IMPLANT
ELECT REM PT RETURN 9FT ADLT (ELECTROSURGICAL) ×3
ELECTRODE REM PT RTRN 9FT ADLT (ELECTROSURGICAL) ×1 IMPLANT
GAUZE SPONGE 4X4 12PLY STRL (GAUZE/BANDAGES/DRESSINGS) ×3 IMPLANT
GAUZE SPONGE 4X4 16PLY XRAY LF (GAUZE/BANDAGES/DRESSINGS) ×2 IMPLANT
GLOVE BIOGEL M 8.0 STRL (GLOVE) ×3 IMPLANT
GLOVE EXAM NITRILE LRG STRL (GLOVE) IMPLANT
GLOVE EXAM NITRILE MD LF STRL (GLOVE) IMPLANT
GLOVE EXAM NITRILE XL STR (GLOVE) IMPLANT
GLOVE EXAM NITRILE XS STR PU (GLOVE) IMPLANT
GOWN STRL REUS W/ TWL LRG LVL3 (GOWN DISPOSABLE) ×1 IMPLANT
GOWN STRL REUS W/ TWL XL LVL3 (GOWN DISPOSABLE) IMPLANT
GOWN STRL REUS W/TWL 2XL LVL3 (GOWN DISPOSABLE) IMPLANT
GOWN STRL REUS W/TWL LRG LVL3 (GOWN DISPOSABLE) ×6
GOWN STRL REUS W/TWL XL LVL3 (GOWN DISPOSABLE) ×3
KIT BASIN OR (CUSTOM PROCEDURE TRAY) ×3 IMPLANT
KIT ROOM TURNOVER OR (KITS) ×3 IMPLANT
NDL HYPO 18GX1.5 BLUNT FILL (NEEDLE) IMPLANT
NDL HYPO 21X1.5 SAFETY (NEEDLE) IMPLANT
NDL HYPO 25X1 1.5 SAFETY (NEEDLE) IMPLANT
NDL SPNL 20GX3.5 QUINCKE YW (NEEDLE) IMPLANT
NEEDLE HYPO 18GX1.5 BLUNT FILL (NEEDLE) IMPLANT
NEEDLE HYPO 21X1.5 SAFETY (NEEDLE) IMPLANT
NEEDLE HYPO 25X1 1.5 SAFETY (NEEDLE) IMPLANT
NEEDLE SPNL 20GX3.5 QUINCKE YW (NEEDLE) IMPLANT
NS IRRIG 1000ML POUR BTL (IV SOLUTION) ×3 IMPLANT
PACK LAMINECTOMY NEURO (CUSTOM PROCEDURE TRAY) ×3 IMPLANT
PAD ARMBOARD 7.5X6 YLW CONV (MISCELLANEOUS) ×9 IMPLANT
PAD DRESSING TELFA 3X8 NADH (GAUZE/BANDAGES/DRESSINGS) IMPLANT
PATTIES SURGICAL .5 X1 (DISPOSABLE) ×3 IMPLANT
PATTIES SURGICAL 1X1 (DISPOSABLE) ×2 IMPLANT
RUBBERBAND STERILE (MISCELLANEOUS) ×6 IMPLANT
SPONGE LAP 4X18 X RAY DECT (DISPOSABLE) IMPLANT
SPONGE SURGIFOAM ABS GEL 100 (HEMOSTASIS) ×2 IMPLANT
SPONGE SURGIFOAM ABS GEL SZ50 (HEMOSTASIS) ×3 IMPLANT
STRIP CLOSURE SKIN 1/2X4 (GAUZE/BANDAGES/DRESSINGS) ×2 IMPLANT
SUT VIC AB 0 CT1 18XCR BRD8 (SUTURE) ×1 IMPLANT
SUT VIC AB 0 CT1 8-18 (SUTURE) ×6
SUT VIC AB 2-0 CP2 18 (SUTURE) ×3 IMPLANT
SUT VIC AB 3-0 SH 8-18 (SUTURE) ×3 IMPLANT
SYR 5ML LL (SYRINGE) IMPLANT
TOWEL OR 17X24 6PK STRL BLUE (TOWEL DISPOSABLE) ×3 IMPLANT
TOWEL OR 17X26 10 PK STRL BLUE (TOWEL DISPOSABLE) ×3 IMPLANT
WATER STERILE IRR 1000ML POUR (IV SOLUTION) ×3 IMPLANT

## 2015-02-04 NOTE — Anesthesia Postprocedure Evaluation (Signed)
  Anesthesia Post-op Note  Patient: Douglas Clark [facility]  Procedure(s) Performed: Procedure(s): LUMBAR LAMINECTOMY/DECOMPRESSION MICRODISCECTOMY  Bilateral - Lumbar two-three lumbar three-four lumbar four-five lumbar five sacral one  (Bilateral)  Patient Location: PACU  Anesthesia Type:General  Level of Consciousness: awake, alert  and oriented  Airway and Oxygen Therapy: Patient Spontanous Breathing and Patient connected to nasal cannula oxygen  Post-op Pain: mild  Post-op Assessment: Post-op Vital signs reviewed, Patient's Cardiovascular Status Stable, Respiratory Function Stable, Patent Airway and Pain level controlled              Post-op Vital Signs: stable  Last Vitals:  Filed Vitals:   02/04/15 1645  BP:   Pulse: 62  Temp: 36.8 C  Resp: 12    Complications: No apparent anesthesia complications

## 2015-02-04 NOTE — Anesthesia Procedure Notes (Signed)
Procedure Name: Intubation Date/Time: 02/04/2015 12:53 PM Performed by: Suzy Bouchard Pre-anesthesia Checklist: Patient identified, Timeout performed, Emergency Drugs available, Suction available and Patient being monitored Patient Re-evaluated:Patient Re-evaluated prior to inductionOxygen Delivery Method: Circle system utilized Preoxygenation: Pre-oxygenation with 100% oxygen Intubation Type: IV induction Ventilation: Mask ventilation without difficulty Laryngoscope Size: Miller and 2 Grade View: Grade II Tube type: Oral Tube size: 7.5 mm Number of attempts: 1 Airway Equipment and Method: Stylet Secured at: 23 cm Tube secured with: Tape Dental Injury: Teeth and Oropharynx as per pre-operative assessment

## 2015-02-04 NOTE — Progress Notes (Signed)
Pt did not take beta blocker this am, but due to HR med held.

## 2015-02-04 NOTE — Op Note (Signed)
NAMEMarland Kitchen  Douglas Clark, Douglas Clark NO.:  [id number]  MEDICAL RECORD NO.:  [id number]  LOCATION:  13C20C                        FACILITY:  [facility]  PHYSICIAN:  Leeroy Cha, M.D.   DATE OF BIRTH:  1940-11-15  DATE OF PROCEDURE:  02/04/2015 DATE OF DISCHARGE:                              OPERATIVE REPORT   PREOPERATIVE DIAGNOSES:  Lumbar stenosis with degenerative disk disease. Neurogenic claudication from L2 down to L5-S1.  POSTOPERATIVE DIAGNOSES:  Lumbar stenosis with degenerative disk disease.  Neurogenic claudication from L2 down to L5-S1.  PROCEDURE:  Bilateral L3, L4, L5 laminectomy, partial L2 laminectomy. Bilateral foraminotomies from L2-L3 down to L5-S1.  Posterolateral arthrodesis with autograft.  Cell Saver.  SURGEON:  Leeroy Cha, M.D.  ASSISTANT:  Dr. Princess Bruins.  CLINICAL HISTORY:  Mr. Douglas Clark is a gentleman who had been complaining of back pain worsened to both legs which is getting worse up to the point that walking is almost impossible for him.  He needs to sit quite frequently.  X-ray showed degenerative disk disease at multiple levels with staple between 2-3 which does not move.  He has lumbar stenosis. Surgery was advised.  The patient knew the risk and benefits of surgery.  DESCRIPTION OF PROCEDURE:  The patient was taken to the OR, and after intubation, he was positioned in a prone manner.  The back was cleaned with DuraPrep.  Midline incision from L2 down to L5-S1 was made and muscles were retracted all the way laterally.  X-rays showed that indeed we were right at the level of L1 and L3.  From then on, we started from the bottom up removing the spinous process of 5, 4, 3.  We did a bilateral laminectomy using the 1 and 2 mm Kerrison punch.  The most difficult part was at the level between L2-3 and L4-L5 where the patient has quite a bit of calcified yellow ligament with quite a bit of narrowing.  Using drilling with different shapes, we were able  to do a wide laminectomy of 3, 4, 5.  At the level of 2-3, we proceeded with removing parts of the lamina of L2 until we were able to see good epidural space.  Then, we went from each foramen from L2 down to L5-S1 with decompression of the foramen.  At the end, we had a good decompression.  Because he has step-off which was not active between 2- 3, we decided to proceed with in situ arthrodesis using the patient's own bone graft.  With that we proceeded with removal of the periosteum of L2-3, L3-4 and L5-S1 and using the autograft for arthrodesis.  The area was irrigated.  Hemovac was left in the epidural area.  Prior to closure, the Valsalva maneuver was negative.  Then, vancomycin powder was used in the operative site and the wound was closed with good layers of Vicryl and staples for the skin.          ______________________________ Leeroy Cha, M.D.     EB/MEDQ  D:  02/04/2015  T:  02/04/2015  Job:  [id number]

## 2015-02-04 NOTE — Transfer of Care (Signed)
Immediate Anesthesia Transfer of Care Note  Patient: Douglas Clark [facility]  Procedure(s) Performed: Procedure(s): LUMBAR LAMINECTOMY/DECOMPRESSION MICRODISCECTOMY  Bilateral - Lumbar two-three lumbar three-four lumbar four-five lumbar five sacral one  (Bilateral)  Patient Location: PACU  Anesthesia Type:General  Level of Consciousness: sedated and responds to stimulation  Airway & Oxygen Therapy: Patient Spontanous Breathing and Patient connected to face mask oxygen  Post-op Assessment: Report given to RN and Post -op Vital signs reviewed and stable  Post vital signs: Reviewed and stable  Last Vitals:  Filed Vitals:   02/04/15 1011  BP: 155/92  Pulse: 59  Temp: 36.7 C  Resp: 20    Complications: No apparent anesthesia complications   Pt opens eyes to name- vss

## 2015-02-04 NOTE — Anesthesia Preprocedure Evaluation (Addendum)
Anesthesia Evaluation  Patient identified by MRN, date of birth, ID band Patient awake    Reviewed: Allergy & Precautions, NPO status , Patient's Chart, lab work & pertinent test results  Airway Mallampati: I       Dental  (+) Teeth Intact, Dental Advisory Given, Caps,    Pulmonary COPD, Current Smoker,    Pulmonary exam normal        Cardiovascular hypertension, Pt. on home beta blockers + CAD, + Past MI and + CABG  Normal cardiovascular exam     Neuro/Psych    GI/Hepatic GERD  Medicated and Controlled,  Endo/Other    Renal/GU Renal disease     Musculoskeletal   Abdominal   Peds  Hematology   Anesthesia Other Findings Short term memory loss per patient- unable to remember and tell me if he took his morning meds or not.  Reproductive/Obstetrics                           Anesthesia Physical Anesthesia Plan  ASA: III  Anesthesia Plan: General   Post-op Pain Management:    Induction: Intravenous  Airway Management Planned: Oral ETT  Additional Equipment:   Intra-op Plan:   Post-operative Plan: Extubation in OR  Informed Consent: I have reviewed the patients History and Physical, chart, labs and discussed the procedure including the risks, benefits and alternatives for the proposed anesthesia with the patient or authorized representative who has indicated his/her understanding and acceptance.     Plan Discussed with: CRNA, Anesthesiologist and Surgeon  Anesthesia Plan Comments:         Anesthesia Quick Evaluation

## 2015-02-05 ENCOUNTER — Encounter (HOSPITAL_COMMUNITY): Payer: Self-pay | Admitting: Neurosurgery

## 2015-02-05 ENCOUNTER — Ambulatory Visit: Payer: Medicare Other

## 2015-02-05 MED ORDER — TAMSULOSIN HCL 0.4 MG PO CAPS
0.8000 mg | ORAL_CAPSULE | Freq: Every day | ORAL | Status: DC
  Administered 2015-02-05 – 2015-02-10 (×6): 0.8 mg via ORAL
  Filled 2015-02-05 (×6): qty 2

## 2015-02-05 MED ORDER — MORPHINE SULFATE (PF) 2 MG/ML IV SOLN
1.0000 mg | INTRAVENOUS | Status: DC | PRN
  Administered 2015-02-05 – 2015-02-10 (×5): 1 mg via INTRAVENOUS
  Filled 2015-02-05 (×6): qty 1

## 2015-02-05 MED FILL — Heparin Sodium (Porcine) Inj 1000 Unit/ML: INTRAMUSCULAR | Qty: 30 | Status: AC

## 2015-02-05 MED FILL — Sodium Chloride IV Soln 0.9%: INTRAVENOUS | Qty: 1000 | Status: AC

## 2015-02-05 NOTE — Progress Notes (Signed)
ID: Douglas Clark, male   DOB: [birth year], 74 y.o.   MRN: [id number] Sleepy, no weakness. C/o incisional pain. Unable to void

## 2015-02-05 NOTE — Care Management Note (Signed)
Case Management Note  Patient Details  Name: Douglas Clark MRN: [id number] Date of Birth: September 14, 1940  Subjective/Objective:                  PROCEDURE: Bilateral L3, L4, L5 laminectomy, partial L2 laminectomy. Bilateral foraminotomies from L2-L3 down to L5-S1. Posterolateral arthrodesis with autograft.  Action/Plan: CM spoke to patient and wife at the bedside. Patient is from home with spouse but state that they are being reccommended for patient to go to SNF. The spouse said that she is sad because she does not want to be away from him but knows that he needs to go there to get stronger. Both patient and family are in agreement with SNF placement and CM advised that SW would come by and speak with them further about the placement process. CM asked if they had any preference of placement and they said their #1 choice was [facility] in [place] and #2 choice was [facility] [place]. CM advised that SW would be able to check into availability. No further needs communicated at this time. Cm remains available should additional needs arise.   Expected Discharge Date:  02/06/15               Expected Discharge Plan:  Skilled Nursing Facility  In-House Referral:  Clinical Social Work  Discharge planning Services  CM Consult  Post Acute Care Choice:    Choice offered to:     DME Arranged:    DME Agency:     HH Arranged:    [place] Agency:     Status of Service:  In process, will continue to follow  Medicare Important Message Given:    Date Medicare IM Given:    Medicare IM give by:    Date Additional Medicare IM Given:    Additional Medicare Important Message give by:     If discussed at [employer] of Stay Meetings, dates discussed:    Additional Comments:  Guido Sander, RN 02/05/2015, 9:39 PM

## 2015-02-05 NOTE — Evaluation (Signed)
Physical Therapy Evaluation Patient Details Name: Douglas Clark MRN: [id number] DOB: 10-17-1940 Today's Date: 02/05/2015   History of Present Illness  Bilateral L3, L4, L5 laminectomy, partial L2 laminectomy.Pt's PMH includes emphysema, MI, anxiety.  Clinical Impression  Patient is s/p above surgery resulting in functional limitations due to the deficits listed below (see PT Problem List). Pt is very lethargic this session and requires max +2 assist for all mobility w/ strong Rt lateral lean sitting EOB and standing.  Per wife, pt at mod I level of mobility, ambulating w/ RW PTA.  Pt has difficulty initiating movement.  Discussed concerns regarding pt's current cognitive and physical presentation. Pt's wife unable to provide physical assist at d/c and pt will need to go to SNF at d/c for additional rehab.  Patient will benefit from skilled PT to increase their independence and safety with mobility to allow discharge to the venue listed below.      Follow Up Recommendations SNF;Supervision/Assistance - 24 hour;Supervision for mobility/OOB    Equipment Recommendations  Other (comment) (TBD at next venue of care and as pt progresses)    Recommendations for Other Services       Precautions / Restrictions Precautions Precautions: Back;Fall Precaution Booklet Issued:  (provided by OT) Precaution Comments: Given to wife Required Braces or Orthoses: Spinal Brace Spinal Brace: Lumbar corset;Applied in sitting position Restrictions Weight Bearing Restrictions: No      Mobility  Bed Mobility Overal bed mobility: +2 for physical assistance;Needs Assistance Bed Mobility: Rolling;Sidelying to Sit Rolling: Max assist Sidelying to sit: Max assist;+2 for physical assistance       General bed mobility comments: Assist provided to trunk to push up to sitting, pt w/ strong Rt lateral lean.  Use of bed pad to scoot pt to EOB.   Transfers Overall transfer level: Needs assistance Equipment  used: Rolling walker (2 wheeled) Transfers: Sit to/from [facility] Sit to Stand: Max assist;+2 physical assistance Stand pivot transfers: Max assist;+2 physical assistance       General transfer comment: Use of bed pad to boost pt up to standing.  Pt demonstrates difficulty with initiation during mobility. Pt became "stuck" amd unable to lift LLE to transfer to chair.  Verbal and tactile cues for each step taken and A provided to buttocks for upright posture.  Pt w/ forward trunk lean.  Max assist to maintain upright 2/2 pt's strong Rt lateral lean.  Ambulation/Gait                Stairs            Wheelchair Mobility    Modified Rankin (Stroke Patients Only)       Balance Overall balance assessment: Needs assistance;History of Falls Sitting-balance support: Feet supported Sitting balance-Leahy Scale: Zero Sitting balance - Comments: Rt and posterior lean Postural control: Posterior lean;Right lateral lean Standing balance support: Bilateral upper extremity supported;During functional activity Standing balance-Leahy Scale: Zero                               Pertinent Vitals/Pain Pain Assessment: 0-10 Pain Score: 10-Worst pain ever Pain Location: back Pain Descriptors / Indicators: Aching;Grimacing;Moaning;Restless Pain Intervention(s): Limited activity within patient's tolerance;Monitored during session;Repositioned    Home Living Family/patient expects to be discharged to:: Private residence Living Arrangements: Spouse/significant other Available Help at Discharge: Family;Available 24 hours/day Type of Home: House Home Access: Stairs to enter Entrance Stairs-Rails: None Entrance Stairs-Number of Steps:  1 Home Layout: One level Home Equipment: Walker - 2 wheels;Walker - 4 wheels;Wheelchair - manual Additional Comments: Pt's wife unable to provide physical assist to pt as she is scheduled for cervical spine surgery after pt has  healed    Prior Function Level of Independence: Needs assistance   Gait / Transfers Assistance Needed: used rollator or RW; drags Bil feet  ADL's / Homemaking Assistance Needed: Wife assisted with LB ADL as needed. Pt bathed self  Comments: wife states pt has had multiple falls - "too many to note"     Hand Dominance   Dominant Hand: Right    Extremity/Trunk Assessment   Upper Extremity Assessment: Defer to OT evaluation           Lower Extremity Assessment: RLE deficits/detail;Generalized weakness;LLE deficits/detail;Difficult to assess due to impaired cognition RLE Deficits / Details: Rt hip flexion 3/5 LLE Deficits / Details: Lt hip flexion 2/5  Cervical / Trunk Assessment: Other exceptions  Communication   Communication: No difficulties  Cognition Arousal/Alertness: Lethargic;Suspect due to medications Behavior During Therapy: Flat affect;Anxious Overall Cognitive Status: Impaired/Different from baseline Area of Impairment: Orientation;Attention;Memory;Following commands;Safety/judgement;Awareness;Problem solving Orientation Level: Disoriented to;Situation Current Attention Level: Sustained Memory: Decreased recall of precautions;Decreased short-term memory Following Commands: Follows one step commands with increased time Safety/Judgement: Decreased awareness of safety;Decreased awareness of deficits (+2 Max A to chair. Thinks he can get back to bed alone) Awareness: Intellectual Problem Solving: Slow processing;Decreased initiation;Difficulty sequencing;Requires verbal cues;Requires tactile cues General Comments: baseline problems with short term and long term memory, per wife pt had head injury sometime in 1990's    General Comments      Exercises        Assessment/Plan    PT Assessment Patient needs continued PT services  PT Diagnosis Difficulty walking;Generalized weakness;Acute pain   PT Problem List Decreased strength;Decreased range of  motion;Decreased activity tolerance;Decreased balance;Decreased mobility;Decreased cognition;Decreased knowledge of use of DME;Decreased safety awareness;Decreased knowledge of precautions;Cardiopulmonary status limiting activity;Pain  PT Treatment Interventions DME instruction;Gait training;Stair training;Functional mobility training;Therapeutic activities;Therapeutic exercise;Balance training;Neuromuscular re-education;Cognitive remediation;Patient/family education;Modalities   PT Goals (Current goals can be found in the Care Plan section) Acute Rehab PT Goals Patient Stated Goal: wife is for pt to have a good quality of life PT Goal Formulation: With family Time For Goal Achievement: 02/26/15 Potential to Achieve Goals: Fair    Frequency Min 5X/week   Barriers to discharge Inaccessible home environment;Decreased caregiver support 1 step to enter home and wife unable to provide physical assist    Co-evaluation PT/OT/SLP Co-Evaluation/Treatment: Yes Reason for Co-Treatment: Complexity of the patient's impairments (multi-system involvement);Necessary to address cognition/behavior during functional activity;For patient/therapist safety PT goals addressed during session: Mobility/safety with mobility;Balance;Proper use of DME OT goals addressed during session: ADL's and self-care;Strengthening/ROM       End of Session Equipment Utilized During Treatment: Gait belt;Back brace Activity Tolerance: Patient limited by fatigue;Patient limited by lethargy (pt's eyes closed throughout majority of session) Patient left: in chair;with call bell/phone within reach;with family/visitor present Nurse Communication: Mobility status;Need for lift equipment;Precautions;Other (comment) (concern for apparent new Rt lateral lean and severe lethargy)         Time: [id number] PT Time Calculation (min) (ACUTE ONLY): 48 min   Charges:   PT Evaluation $Initial PT Evaluation Tier I: 1 Procedure PT  Treatments $Therapeutic Activity: 8-22 mins   PT G Codes:       Joslyn Hy PT, DPT [phone] Pager: [phone] 02/05/2015, 4:55 PM

## 2015-02-05 NOTE — Progress Notes (Addendum)
Occupational Therapy Evaluation Patient Details Name: Douglas Clark MRN: [id number] DOB: 16-Oct-1940 Today's Date: 02/05/2015    History of Present Illness Bilateral L3, L4, L5 laminectomy, partial L2 laminectomy.   Clinical Impression   PTA, pt lived with wife and was mod I with mobility @ RW level. Pt had min A with LB ADL. Wife states pt has had numerus falls. Pt currently Max A +2 with all mobility and ADL. Pt with R lateral and posterior lean during eval. Unable to maintain upright sitting posture. Pt demonstrates difficulty with initiation of any movement and "freezes" during mobility. Wife states pt "shuffles" at baseline and has demonstrated difficulty with "getting started". Discussed concerns with nsg who states that current condition may be due to pain meds. Staff will have to use "stedy" lift equipment to safely mobilize pt back to bed. Pt will need rehab at SNF. Will follow acutely.     Follow Up Recommendations  SNF;Supervision/Assistance - 24 hour    Equipment Recommendations  Other (comment) (TBA at SNF)    Recommendations for Other Services       Precautions / Restrictions Precautions Precautions: Back;Fall Precaution Booklet Issued: Yes (comment) Precaution Comments: Given to wife Required Braces or Orthoses: Spinal Brace Spinal Brace: Lumbar corset;Applied in sitting position      Mobility Bed Mobility Overal bed mobility: +2 for physical assistance;Needs Assistance Bed Mobility: Rolling;Sidelying to Sit Rolling: Max assist Sidelying to sit: Max assist;+2 for physical assistance       General bed mobility comments: Very rigid. Unable ot move legs off bed  Transfers Overall transfer level: Needs assistance   Transfers: Sit to/from Stand;Stand Pivot Transfers Sit to Stand: Max assist;+2 physical assistance Stand pivot transfers: Max assist;+2 physical assistance       General transfer comment: Pt demonstrates difficulty with initiation during  mobility. Pt became "stuck" amd unable to lift LLE to trasnfer to chair.Pt unable to maintain position inside RW. Pushing RW in front.  Eventually had to remove RW and use HHA x2 to assist pt to chair    Balance Overall balance assessment: Needs assistance Sitting-balance support: Feet supported Sitting balance-Leahy Scale: Zero Sitting balance - Comments: R and posterior lean Postural control: Posterior lean;Right lateral lean Standing balance support: Bilateral upper extremity supported Standing balance-Leahy Scale: Zero                              ADL Overall ADL's : Needs assistance/impaired     Grooming: Maximal assistance   Upper Body Bathing: Maximal assistance;Sitting   Lower Body Bathing: Total assistance   Upper Body Dressing : Maximal assistance   Lower Body Dressing: Total assistance;Sit to/from stand   Toilet Transfer: +2 for physical assistance;Maximal assistance Toilet Transfer Details (indicate cue type and reason): simulated. Pt attempting to void unsuccessfully   Toileting - Clothing Manipulation Details (indicate cue type and reason): total A; pt unalbe to release RW to assist with pericare     Functional mobility during ADLs: Maximal assistance;+2 for physical assistance;Cueing for safety;Cueing for sequencing;Rolling walker General ADL Comments: Max A for      Vision     Perception     Praxis Praxis Praxis tested?: Deficits Deficits: Initiation    Pertinent Vitals/Pain Pain Assessment: 0-10 Pain Score: 10-Worst pain ever Pain Location: back Pain Descriptors / Indicators: Aching;Restless;Moaning;Grimacing;Guarding Pain Intervention(s): Limited activity within patient's tolerance;Monitored during session     Hand Dominance Right   Extremity/Trunk Assessment  Upper Extremity Assessment Upper Extremity Assessment: Generalized weakness   Lower Extremity Assessment Lower Extremity Assessment: Generalized weakness;Defer to PT  evaluation (RLE appears stronger than L)   Cervical / Trunk Assessment Cervical / Trunk Assessment: Other exceptions Cervical / Trunk Exceptions: R lateral lean. Unable to maintain upright posture in sitting or standing.   Communication     Cognition Arousal/Alertness: Lethargic;Suspect due to medications Behavior During Therapy: Flat affect;Anxious Overall Cognitive Status: Impaired/Different from baseline Area of Impairment: Orientation;Attention;Memory;Following commands;Safety/judgement;Awareness;Problem solving Orientation Level: Disoriented to;Situation Current Attention Level: Sustained Memory: Decreased recall of precautions;Decreased short-term memory Following Commands: Follows one step commands with increased time Safety/Judgement: Decreased awareness of safety;Decreased awareness of deficits (+2 Max A to chair. Thinks he can get back to bed alone) Awareness: Intellectual Problem Solving: Slow processing;Decreased initiation;Difficulty sequencing;Requires verbal cues;Requires tactile cues General Comments: baseline problems with STM and LTM; history of TBI   General Comments       Exercises       Shoulder Instructions      Home Living Family/patient expects to be discharged to:: Private residence Living Arrangements: Spouse/significant other Available Help at Discharge: Family;Available 24 hours/day Type of Home: House Home Access: Stairs to enter [employer] of Steps: 1   Home Layout: One level     Bathroom Shower/Tub: Occupational psychologist: Handicapped height Bathroom Accessibility: Yes How Accessible: Accessible via walker;Accessible via wheelchair Home Equipment: Gilford Rile - 2 wheels;Walker - 4 wheels          Prior Functioning/Environment Level of Independence: Needs assistance  Gait / Transfers Assistance Needed: used rollator or RW; drags B feet ADL's / Homemaking Assistance Needed: Wife assisted with LB ADL as needed. Pt  bathed self   Comments: wife states pt has had multiple falls - too many to note    OT Diagnosis: Generalized weakness;Cognitive deficits;Acute pain   OT Problem List: Decreased strength;Decreased range of motion;Decreased activity tolerance;Impaired balance (sitting and/or standing);Decreased coordination;Decreased cognition;Decreased safety awareness;Decreased knowledge of use of DME or AE;Decreased knowledge of precautions;Impaired sensation;Pain   OT Treatment/Interventions: Self-care/ADL training;Therapeutic exercise;DME and/or AE instruction;Therapeutic activities;Cognitive remediation/compensation;Patient/family education;Balance training    OT Goals(Current goals can be found in the care plan section) Acute Rehab OT Goals Patient Stated Goal: wife is for pt to have a quality of life OT Goal Formulation: With patient/family Time For Goal Achievement: 02/19/15 Potential to Achieve Goals: Good ADL Goals Pt Will Perform Upper Body Bathing: with set-up;with supervision;sitting Pt Will Perform Lower Body Bathing: with mod assist;sit to/from stand;with adaptive equipment Pt Will Transfer to Toilet: with +2 assist;with min assist;bedside commode;ambulating Additional ADL Goal #1: Bed mobility with mod A in preparation for ADL Additional ADL Goal #2: Pt will verbalize back precautions using back precaution sheet for cues  OT Frequency: Min 2X/week   Barriers to D/C:    wife having cervical surgery soon and is unable ot physically assist       Co-evaluation PT/OT/SLP Co-Evaluation/Treatment: Yes Reason for Co-Treatment: Complexity of the patient's impairments (multi-system involvement);Necessary to address cognition/behavior during functional activity;For patient/therapist safety   OT goals addressed during session: ADL's and self-care;Strengthening/ROM      End of Session Equipment Utilized During Treatment: Gait belt;Rolling walker;Back brace Nurse Communication: Mobility  status;Precautions;Need for lift equipment (use Stedy)  Activity Tolerance: Patient limited by pain;Patient limited by lethargy Patient left: in chair;with call bell/phone within reach;with family/visitor present   Time: 1410-1505 OT Time Calculation (min): 55 min Charges:  OT General Charges $OT Visit: 1  Procedure OT Evaluation $Initial OT Evaluation Tier I: 1 Procedure OT Treatments $Self Care/Home Management : 8-22 mins G-Codes:    Jaimey Franchini,HILLARY 02-12-15, 4:26 PM   [facility], OTR/L  [phone] 02-12-2015

## 2015-02-05 NOTE — Progress Notes (Signed)
PT Cancellation Note  Patient Details Name: Douglas Clark MRN: [id number] DOB: April 03, 1940   Cancelled Treatment:    Reason Eval/Treat Not Completed: Pain limiting ability to participate. Pt sleeping and wife and daughter stating pt has poorly controlled pain all night and he is finally asleep.  Requested PT to allow pt to sleep and to return later.  Will check back this afternoon as able.   Zafir Schauer LUBECK 02/05/2015, 9:24 AM

## 2015-02-05 NOTE — Care Management Note (Addendum)
Case Management Note  Patient Details  Name: SHAIDEN JORY MRN: [id number] Date of Birth: [birth year]  Subjective/Objective:  Patient with lumbar stenosis. Patient underwent L2-S1 Laminectomy/ decompression/ microdiscectomy.              Action/Plan: Awaiting PT/OT recommendations. CM will continue to follow for discharge needs.  Expected Discharge Date:  02/06/15               Expected Discharge Plan:     In-House Referral:     Discharge planning Services     Post Acute Care Choice:    Choice offered to:     DME Arranged:    DME Agency:     HH Arranged:    HH Agency:     Status of Service:     Medicare Important Message Given:    Date Medicare IM Given:    Medicare IM give by:    Date Additional Medicare IM Given:    Additional Medicare Important Message give by:     If discussed at [place] of Stay Meetings, dates discussed:    Additional Comments:  Pollie Friar, RN 02/05/2015, 10:04 AM

## 2015-02-06 ENCOUNTER — Encounter (HOSPITAL_COMMUNITY): Payer: Self-pay | Admitting: Physical Medicine and Rehabilitation

## 2015-02-06 DIAGNOSIS — M4806 Spinal stenosis, lumbar region: Principal | ICD-10-CM

## 2015-02-06 MED ORDER — PANTOPRAZOLE SODIUM 40 MG PO TBEC
40.0000 mg | DELAYED_RELEASE_TABLET | Freq: Every day | ORAL | Status: DC
  Administered 2015-02-06 – 2015-02-10 (×5): 40 mg via ORAL
  Filled 2015-02-06 (×5): qty 1

## 2015-02-06 MED ORDER — DOCUSATE SODIUM 100 MG PO CAPS
100.0000 mg | ORAL_CAPSULE | Freq: Two times a day (BID) | ORAL | Status: DC
  Administered 2015-02-06 – 2015-02-10 (×8): 100 mg via ORAL
  Filled 2015-02-06 (×8): qty 1

## 2015-02-06 MED ORDER — BISACODYL 10 MG RE SUPP
10.0000 mg | Freq: Every day | RECTAL | Status: DC | PRN
Start: 2015-02-06 — End: 2015-02-10
  Administered 2015-02-07: 10 mg via RECTAL
  Filled 2015-02-06: qty 1

## 2015-02-06 NOTE — Progress Notes (Signed)
Patient ID: Douglas Clark, male   DOB: [birth year], 74 y.o.   MRN: [id number] More awake, c/o incisional ;pain. Decrease of drainage. Rehab to see

## 2015-02-06 NOTE — Progress Notes (Signed)
Patient in and out cathed for the second time at 2330. Patient able to void 112mL this morning but educated on needing to void further this am as well as continue to drink fluids. Some burning with urination. Continuing to monitor. Karon Cotterill, Rande Brunt, RN

## 2015-02-06 NOTE — Progress Notes (Signed)
Physical Therapy Treatment Patient Details Name: Douglas Clark MRN: [id number] DOB: 1941/03/11 Today's Date: 02/06/2015    History of Present Illness Bilateral L3, L4, L5 laminectomy, partial L2 laminectomy.Pt's PMH includes emphysema, MI, anxiety.    PT Comments    Patient less lethargic, however remains confused and requires redirection and multiple repetitions of cues to complete tasks. Wife present and reports this is worse than his baseline cognitive deficits.   Follow Up Recommendations  SNF;Supervision/Assistance - 24 hour     Equipment Recommendations  Other (comment) (TBD at next venue of care and as pt progresses)    Recommendations for Other Services       Precautions / Restrictions Precautions Precautions: Back;Fall Precaution Booklet Issued:  (provided by OT) Required Braces or Orthoses: Spinal Brace Spinal Brace: Lumbar corset;Applied in sitting position Restrictions Weight Bearing Restrictions: No    Mobility  Bed Mobility Overal bed mobility: +2 for physical assistance;Needs Assistance Bed Mobility: Rolling;Sidelying to Sit Rolling: Mod assist Sidelying to sit: Max assist;+2 for physical assistance       General bed mobility comments: Assist provided to trunk to push up to sitting.  Use of bed pad to scoot pt to EOB.   Transfers Overall transfer level: Needs assistance Equipment used: Rolling walker (2 wheeled) Transfers: Sit to/from Stand Sit to Stand: +2 physical assistance;Mod assist;From elevated surface         General transfer comment: elevated bed to simulate height of bed at home; max cues (with repetition) for safe use of RW and maintaining back precautions  Ambulation/Gait Ambulation/Gait assistance: Mod assist;+2 physical assistance;+2 safety/equipment Ambulation Distance (Feet): 5 Feet Assistive device: Rolling walker (2 wheeled) Gait Pattern/deviations: Step-through pattern;Decreased stride length;Leaning posteriorly;Shuffle   Gait velocity interpretation: Below normal speed for age/gender General Gait Details: posterior lean with stagger and incr assist; limited distance due to need to urinate (chair brought to pt)   Stairs            Wheelchair Mobility    Modified Rankin (Stroke Patients Only)       Balance   Sitting-balance support: Bilateral upper extremity supported;Feet supported Sitting balance-Leahy Scale: Poor Sitting balance - Comments: Rt and posterior lean   Standing balance support: Bilateral upper extremity supported Standing balance-Leahy Scale: Poor                      Cognition Arousal/Alertness: Lethargic;Suspect due to medications Behavior During Therapy: Flat affect;Anxious Overall Cognitive Status: Impaired/Different from baseline Area of Impairment: Orientation;Attention;Memory;Following commands;Safety/judgement;Awareness;Problem solving Orientation Level: Disoriented to;Time Current Attention Level: Sustained Memory: Decreased recall of precautions;Decreased short-term memory Following Commands: Follows one step commands with increased time Safety/Judgement: Decreased awareness of safety;Decreased awareness of deficits Awareness: Intellectual Problem Solving: Slow processing;Decreased initiation;Difficulty sequencing;Requires verbal cues;Requires tactile cues General Comments: baseline problems with short term and long term memory, per wife pt had head injury sometime in 1990's; wife reports he is more confused than PTA    Exercises      General Comments        Pertinent Vitals/Pain Pain Assessment: Faces Faces Pain Scale: Hurts even more Pain Location: back Pain Intervention(s): Limited activity within patient's tolerance;Monitored during session;Premedicated before session;Repositioned    Home Living                      Prior Function            PT Goals (current goals can now be found in the care plan section)  Acute Rehab PT  Goals Patient Stated Goal: wife is for pt to have a good quality of life Time For Goal Achievement: 02/26/15 Progress towards PT goals: Progressing toward goals    Frequency  Min 5X/week    PT Plan Current plan remains appropriate    Co-evaluation             End of Session Equipment Utilized During Treatment: Gait belt;Back brace Activity Tolerance: Patient limited by fatigue;Other (comment) (urgent need to urinate) Patient left: in chair;with call bell/phone within reach;with family/visitor present     Time: 1010-1044 PT Time Calculation (min) (ACUTE ONLY): 34 min  Charges:  $Gait Training: 8-22 mins $Therapeutic Activity: 8-22 mins                    G Codes:      Larosa Rhines 2015/03/01, 2:16 PM Pager [phone]

## 2015-02-06 NOTE — Progress Notes (Signed)
Left message at Dr. Harley Hallmark office regarding pt request for PPI. Will monitor for new orders. Wendee Copp

## 2015-02-06 NOTE — Progress Notes (Signed)
Rehab admissions - Please see consult done by Dr. Naaman Plummer recommending SNF placement.  Call me for questions.  [phone]

## 2015-02-06 NOTE — Consult Note (Signed)
Physical Medicine and Rehabilitation Consult  Reason for Consult: Back pain with radiculopathy and neurogenic claudication Referring Physician: Dr. Joya Salm   HPI: Douglas Clark is a 74 y.o. male with history of CAD s/p CABG, HTN, emphysema, back pain radiating to BLE for years progressive weakness and neurogenic claudication with difficulty walking. MRI done revealing lumbar stenosis L2-L5/S1 with stable step off L2/3.  Patient elected to undergo lumbar laminectomy L2-L5 with foraminotomies L2-L3 to L5-S1 and posterolateral arthrodesis by Dr. Joya Salm on 02/04/15.  Post op has had problems with poor sleep as well as difficulty with pain control. He has had problems voiding requiring in and out caths. PT/OT evaluation done and patient noted to have problems with lethargy and difficulty initiating movement. CIR recommended by MD for follow up therapy.    Review of Systems  HENT: Negative for hearing loss.   Eyes: Negative for blurred vision and photophobia.  Respiratory: Negative for cough, shortness of breath and wheezing.        Sore throat all the time  Cardiovascular: Negative for chest pain, palpitations and leg swelling.       Legs stay cold due to "circulation" problems.  Gastrointestinal: Positive for heartburn. Negative for abdominal pain and constipation.  Genitourinary: Positive for urgency.       Gets up twice at nights  Musculoskeletal: Positive for myalgias, back pain and falls (last few months).  Neurological: Negative for dizziness, tingling and headaches.  Psychiatric/Behavioral: Positive for memory loss. The patient is nervous/anxious and has insomnia.     Past Medical History  Diagnosis Date  . Myocardial infarction ([place])   . Hypertension   . Emphysema lung ([place])   . Anxiety   . History of kidney stones   . GERD (gastroesophageal reflux disease)     Past Surgical History  Procedure Laterality Date  . Coronary artery bypass graft      2000      . Kidney  surgery      REMOVED STONE FROM RT KIDNEY  . Lithotripsy      X2   . Lumbar laminectomy/decompression microdiscectomy Bilateral 02/04/2015    Procedure: LUMBAR LAMINECTOMY/DECOMPRESSION MICRODISCECTOMY  Bilateral - Lumbar two-three lumbar three-four lumbar four-five lumbar five sacral one ;  Surgeon: Leeroy Cha, MD;  Location: [facility];  Service: Neurosurgery;  Laterality: Bilateral;    Family History  Problem Relation Age of Onset  . Heart disease Father     Social History:  Married--wife disabled due to DDD and can provide supervision only after discharge. Disabled since age 38 due to chronic fatigue and memory issues.  He reports that he has been smoking 1 PPD.  He does not have any smokeless tobacco history on file. He reports that he drinks beer on occasion.  He reports that he does not use illicit drugs.    Allergies  Allergen Reactions  . Doxazosin Hives  . Trazodone And Nefazodone Other (See Comments)    shivers  . Hctz [Hydrochlorothiazide] Rash    blisters    Medications Prior to Admission  Medication Sig Dispense Refill  . acetaminophen (TYLENOL) 500 MG tablet Take 500 mg by mouth every 6 (six) hours as needed. pain    . aspirin 81 MG chewable tablet Chew 81 mg by mouth daily.    . carvedilol (COREG) 12.5 MG tablet Take 1 tablet (12.5 mg total) by mouth 2 (two) times daily with a meal. 60 tablet 10  . cholecalciferol (VITAMIN D) 1000  UNITS tablet Take 2,000 Units by mouth daily.    . clonazePAM (KLONOPIN) 2 MG tablet Take 2 mg by mouth at bedtime.    Marland Kitchen esomeprazole (NEXIUM) 20 MG capsule Take 20 mg by mouth daily.    Marland Kitchen HYDROcodone-acetaminophen (NORCO/VICODIN) 5-325 MG per tablet Take 1 tablet by mouth every 6 (six) hours as needed for moderate pain.    . meloxicam (MOBIC) 15 MG tablet Take 15 mg by mouth daily.    . Multiple Vitamin (MULTI-VITAMINS) TABS Take 1 tablet by mouth daily.    . simvastatin (ZOCOR) 20 MG tablet Take 20 mg by mouth daily.    .  nitroGLYCERIN (NITROSTAT) 0.4 MG SL tablet Place 1 tablet (0.4 mg total) under the tongue every 5 (five) minutes as needed for chest pain. 25 tablet 3    Home: Home Living Family/patient expects to be discharged to:: Private residence Living Arrangements: Spouse/significant other Available Help at Discharge: Family, Available 24 hours/day Type of Home: House Home Access: Stairs to enter Technical brewer of Steps: 1 Entrance Stairs-Rails: None Home Layout: One level Bathroom Shower/Tub: Multimedia programmer: Handicapped height Bathroom Accessibility: Yes Home Equipment: Environmental consultant - 2 wheels, Environmental consultant - 4 wheels, Wheelchair - manual Additional Comments: Pt's wife unable to provide physical assist to pt as she is scheduled for cervical spine surgery after pt has healed  Functional History: Prior Function Level of Independence: Needs assistance Gait / Transfers Assistance Needed: used rollator or RW; drags Bil feet ADL's / Homemaking Assistance Needed: Wife assisted with LB ADL as needed. Pt bathed self Comments: wife states pt has had multiple falls - "too many to note" Functional Status:  Mobility: Bed Mobility Overal bed mobility: +2 for physical assistance, Needs Assistance Bed Mobility: Rolling, Sidelying to Sit Rolling: Max assist Sidelying to sit: Max assist, +2 for physical assistance General bed mobility comments: Assist provided to trunk to push up to sitting, pt w/ strong Rt lateral lean.  Use of bed pad to scoot pt to EOB.  Transfers Overall transfer level: Needs assistance Equipment used: Rolling walker (2 wheeled) Transfers: Sit to/from Stand, Stand Pivot Transfers Sit to Stand: Max assist, +2 physical assistance Stand pivot transfers: Max assist, +2 physical assistance General transfer comment: Use of bed pad to boost pt up to standing.  Pt demonstrates difficulty with initiation during mobility. Pt became "stuck" amd unable to lift LLE to transfer to  chair.  Verbal and tactile cues for each step taken and A provided to buttocks for upright posture.  Pt w/ forward trunk lean.  Max assist to maintain upright 2/2 pt's strong Rt lateral lean.      ADL: ADL Overall ADL's : Needs assistance/impaired Grooming: Maximal assistance Upper Body Bathing: Maximal assistance, Sitting Lower Body Bathing: Total assistance Upper Body Dressing : Maximal assistance Lower Body Dressing: Total assistance, Sit to/from stand Toilet Transfer: +2 for physical assistance, Maximal assistance Toilet Transfer Details (indicate cue type and reason): simulated. Pt attempting to void unsuccessfully Toileting - Clothing Manipulation Details (indicate cue type and reason): total A; pt unalbe to release RW to assist with pericare Functional mobility during ADLs: Maximal assistance, +2 for physical assistance, Cueing for safety, Cueing for sequencing, Rolling walker General ADL Comments: Max A for   Cognition: Cognition Overall Cognitive Status: Impaired/Different from baseline Orientation Level: Oriented X4 Cognition Arousal/Alertness: Lethargic, Suspect due to medications Behavior During Therapy: Flat affect, Anxious Overall Cognitive Status: Impaired/Different from baseline Area of Impairment: Orientation, Attention, Memory, Following commands, Safety/judgement, Awareness,  Problem solving Orientation Level: Disoriented to, Situation Current Attention Level: Sustained Memory: Decreased recall of precautions, Decreased short-term memory Following Commands: Follows one step commands with increased time Safety/Judgement: Decreased awareness of safety, Decreased awareness of deficits (+2 Max A to chair. Thinks he can get back to bed alone) Awareness: Intellectual Problem Solving: Slow processing, Decreased initiation, Difficulty sequencing, Requires verbal cues, Requires tactile cues General Comments: baseline problems with short term and long term memory, per wife  pt had head injury sometime in 1990's   Blood pressure 121/64, pulse 78, temperature 97.9 F (36.6 C), temperature source Oral, resp. rate 16, height 5\' 9"  (1.753 m), weight 88.905 kg (196 lb), SpO2 95 %. Physical Exam  Nursing note and vitals reviewed. Constitutional: He is oriented to person, place, and time. He appears well-developed and well-nourished. He appears lethargic. He is easily aroused.  HENT:  Head: Normocephalic and atraumatic.  Eyes: Conjunctivae are normal. Pupils are equal, round, and reactive to light.  Neck: Normal range of motion. Neck supple.  Cardiovascular: Normal rate and regular rhythm.   Respiratory: Effort normal and breath sounds normal. No respiratory distress. He has no wheezes.  GI: Soft. Bowel sounds are normal. He exhibits no distension. There is no tenderness.  Musculoskeletal: He exhibits no edema or tenderness.  Neurological: He is oriented to person, place, and time and easily aroused. He appears lethargic.  Flat affect and confused appearing. Pupils pinpoint appearing. Delayed verbal output and looked to wife to answer questions as unable to answer medical questions. Wife reports "poor STM" due to chronic fatigue and problems with LTM due to concussion.  Able to follow basic commands and move all four.   Skin: Skin is warm and dry. No rash noted. No erythema.  Psychiatric: His affect is blunt. He is slowed. He exhibits abnormal recent memory and abnormal remote memory. He is inattentive.    No results found for this or any previous visit (from the past 24 hour(s)). Dg Lumbar Spine 2-3 Views  02/04/2015  CLINICAL DATA:  Intraoperative images from L2-L3, L3-L4, L4-L5, and L5-S1 lumbar laminectomy and decompression with microdiskectomy. EXAM: LUMBAR SPINE - 2-3 VIEW COMPARISON:  Lumbar spine study of January 06, 2015 FINDINGS: Image #1: Metallic trocars overlying the spinous processes of L1 and L3. Surgical sponge material lies inferior to the L3  transverse process. Image #2: A tissue spreader device is now present. Metallic trocars project at the level of the inferior aspect of the pedicle at L2 and overlie the pars interarticularis of L4. Image #3: The tissue spreader device remains present. Surgical sponge material overlies the L3 spinous process region. A metallic trocar projects along the posterior aspect of the body of L2 and a second trocar projects along the posterior superior aspect of the body of S1. IMPRESSION: Intraoperative images from lower lumbar laminectomy and decompression procedure as described. There is no immediate complication. Electronically Signed   By: David  Martinique M.D.   On: 02/04/2015 15:27    Assessment/Plan: Diagnosis: lumbar stenosis with radiculopathy and neurogenic claudication 1. Does the need for close, 24 hr/day medical supervision in concert with the patient's rehab needs make it unreasonable for this patient to be served in a less intensive setting? No 2. Co-Morbidities requiring supervision/potential complications: CAD 3. Due to bladder management, bowel management, safety, skin/wound care, disease management, medication administration and pain management, does the patient require 24 hr/day rehab nursing? Potentially 4. Does the patient require coordinated care of a physician, rehab nurse, PT (1-2 hrs/day,  5 days/week) and OT (1-2 hrs/day, 5 days/week) to address physical and functional deficits in the context of the above medical diagnosis(es)? Potentially Addressing deficits in the following areas: balance, endurance, locomotion, strength, transferring, bowel/bladder control, bathing, dressing, feeding and grooming 5. Can the patient actively participate in an intensive therapy program of at least 3 hrs of therapy per day at least 5 days per week? No 6. The potential for patient to make measurable gains while on inpatient rehab is fair 7. Anticipated functional outcomes upon discharge from inpatient rehab  are n/a  with PT, n/a with OT, n/a with SLP. 8. Estimated rehab length of stay to reach the above functional goals is: n/a 9. Does the patient have adequate social supports and living environment to accommodate these discharge functional goals? No 10. Anticipated D/C setting: Other 11. Anticipated post D/C treatments: N/A 12. Overall Rehab/Functional Prognosis: fair  RECOMMENDATIONS: This patient's condition is appropriate for continued rehabilitative care in the following setting: SNF Patient has agreed to participate in recommended program. Yes Note that insurance prior authorization may be required for reimbursement for recommended care.  Comment: Spoke to patient and wife. He has been sedentary for some time due to the back. He will need a slower, less intensive approach to his rehab. Wife also needs surgery her self and really can't provide any physical assistance.   Meredith Staggers, MD, [facility] 02/06/2015     02/06/2015

## 2015-02-07 MED ORDER — ALUM & MAG HYDROXIDE-SIMETH 200-200-20 MG/5ML PO SUSP
15.0000 mL | ORAL | Status: DC | PRN
  Filled 2015-02-07: qty 30

## 2015-02-07 MED ORDER — GI COCKTAIL ~~LOC~~
30.0000 mL | Freq: Three times a day (TID) | ORAL | Status: DC | PRN
  Administered 2015-02-07: 30 mL via ORAL
  Filled 2015-02-07 (×2): qty 30

## 2015-02-07 MED ORDER — ALUM & MAG HYDROXIDE-SIMETH 200-200-20 MG/5ML PO SUSP
30.0000 mL | ORAL | Status: DC | PRN
  Administered 2015-02-07: 30 mL via ORAL

## 2015-02-07 MED ORDER — CHLORPROMAZINE HCL 25 MG PO TABS
25.0000 mg | ORAL_TABLET | Freq: Four times a day (QID) | ORAL | Status: DC | PRN
  Administered 2015-02-07 – 2015-02-08 (×2): 25 mg via ORAL
  Filled 2015-02-07 (×3): qty 1

## 2015-02-07 NOTE — Progress Notes (Signed)
Patient ID: Douglas Clark, male   DOB: 05/21/1940, 74 y.o.   MRN: [id number] Much better. To get dulcolax suppository tonite fos constipation

## 2015-02-07 NOTE — Progress Notes (Signed)
Physical Therapy Treatment Patient Details Name: Douglas Clark MRN: [id number] DOB: 05-18-1940 Today's Date: 02/07/2015    History of Present Illness Bilateral L3, L4, L5 laminectomy, partial L2 laminectomy.Pt's PMH includes emphysema, MI, anxiety.    PT Comments    Patient with decr level of arousal compared to 11/18. At time of PT, had not had any sedating meds since 0300. Wife attributes decr alertness to lack of sleep last night. Requires constant cues to keep eyes open.  Follow Up Recommendations  SNF;Supervision/Assistance - 24 hour     Equipment Recommendations  Other (comment) (TBD at next venue of care and as pt progresses)    Recommendations for Other Services       Precautions / Restrictions Precautions Precautions: Back;Fall Precaution Comments: pt can state only no twisting Required Braces or Orthoses: Spinal Brace Spinal Brace: Lumbar corset;Applied in sitting position Restrictions Weight Bearing Restrictions: No    Mobility  Bed Mobility Overal bed mobility: Needs Assistance;+2 for physical assistance Bed Mobility: Rolling;Sidelying to Sit Rolling: Mod assist Sidelying to sit: Max assist;+2 for physical assistance       General bed mobility comments: vc for technique; assist for each step due to lethargy  Transfers Overall transfer level: Needs assistance Equipment used: Rolling walker (2 wheeled) Transfers: Sit to/from Stand Sit to Stand: From elevated surface;Min assist;+2 physical assistance         General transfer comment: elevated bed to simulate height of bed at home; max cues (with repetition) for safe use of RW and maintaining back precautions  Ambulation/Gait Ambulation/Gait assistance: Min assist;Mod assist;+2 physical assistance;+2 safety/equipment Ambulation Distance (Feet): 50 Feet Assistive device: Rolling walker (2 wheeled) Gait Pattern/deviations: Step-through pattern;Decreased stride length;Shuffle   Gait velocity  interpretation: <1.8 ft/sec, indicative of risk for recurrent falls General Gait Details: posterior loss of balance required max assist when stepping back from toilet; required constant cues for incr step length and foot clearance; vc for proper use of RW and placement   Stairs            Wheelchair Mobility    Modified Rankin (Stroke Patients Only)       Balance   Sitting-balance support: No upper extremity supported;Feet supported Sitting balance-Leahy Scale: Poor Sitting balance - Comments: very sleepy   Standing balance support: No upper extremity supported Standing balance-Leahy Scale: Poor                      Cognition Arousal/Alertness: Lethargic;Suspect due to medications Behavior During Therapy: Flat affect Overall Cognitive Status: Impaired/Different from baseline Area of Impairment: Orientation;Attention;Memory;Following commands;Safety/judgement;Awareness;Problem solving Orientation Level: Disoriented to;Time Current Attention Level: Sustained Memory: Decreased recall of precautions;Decreased short-term memory Following Commands: Follows one step commands with increased time Safety/Judgement: Decreased awareness of safety;Decreased awareness of deficits Awareness: Intellectual Problem Solving: Slow processing;Decreased initiation;Difficulty sequencing;Requires verbal cues;Requires tactile cues General Comments: more lethargic than 11/17, however was sleeping on arrival (at 10:00)    Exercises      General Comments General comments (skin integrity, edema, etc.): Wife present and states pt did not sleep well last night. He has had no sedating meds since 0300.      Pertinent Vitals/Pain Pain Assessment: Faces Faces Pain Scale: Hurts even more Pain Location: back Pain Intervention(s): Limited activity within patient's tolerance;Monitored during session;Repositioned (RN notified re: pt's pain)    Home Living                      Prior  Function            PT Goals (current goals can now be found in the care plan section) Acute Rehab PT Goals Patient Stated Goal: wife is for pt to have a good quality of life Time For Goal Achievement: 02/26/15 Progress towards PT goals: Progressing toward goals (slow progress due to decr arousal)    Frequency  Min 5X/week    PT Plan Current plan remains appropriate    Co-evaluation             End of Session Equipment Utilized During Treatment: Gait belt;Back brace Activity Tolerance: Patient limited by fatigue Patient left: in chair;with call bell/phone within reach;with family/visitor present     Time: [id number] PT Time Calculation (min) (ACUTE ONLY): 35 min  Charges:  $Gait Training: 23-37 mins                    G Codes:      Nkenge Sonntag 02-21-15, 11:14 AM  Pager [phone]

## 2015-02-07 NOTE — Progress Notes (Signed)
OT Cancellation Note  Patient Details Name: Douglas Clark MRN: [id number] DOB: [birth year]   Cancelled Treatment:    Reason Eval/Treat Not Completed: Fatigue/lethargy limiting ability to participate  [place], OTR/L  [id number] 02/07/2015 02/07/2015, 2:20 PM

## 2015-02-07 NOTE — Care Management Important Message (Signed)
Important Message  Patient Details  Name: REDD RENDE MRN: [id number] Date of Birth: 24-Jun-1940   Medicare Important Message Given:  Yes    Abdulah Iqbal P Metcalfe 02/07/2015, 1:18 PM

## 2015-02-07 NOTE — Progress Notes (Signed)
Patient ID: Douglas Clark, male   DOB: June 20, 1940, 74 y.o.   MRN: [id number] Stable, ambulating, less pain. Wound dry. voiding

## 2015-02-08 NOTE — Care Management Note (Signed)
Case Management Note  Patient Details  Name: Douglas Clark MRN: [id number] Date of Birth: 04-22-1940  Subjective/Objective:                  74 yo M s/p bilateral L3, L4, L5 laminectomy, partial L2 laminectomy  Action/Plan: PT is recommending SNF   Expected Discharge Date:  02/06/15               Expected Discharge Plan:  Skilled Nursing Facility  In-House Referral:  Clinical Social Work  Discharge planning Services  CM Consult  Post Acute Care Choice:    Choice offered to:     DME Arranged:    DME Agency:     HH Arranged:    [place] Agency:     Status of Service:  In process, will continue to follow  Medicare Important Message Given:  Yes Date Medicare IM Given:    Medicare IM give by:    Date Additional Medicare IM Given:    Additional Medicare Important Message give by:     If discussed at [place] of Stay Meetings, dates discussed:    Additional Comments: PT is recommending SNF. SW to f/u to assist with SNF placement.  Norina Buzzard, RN 02/08/2015, 2:32 PM

## 2015-02-08 NOTE — Progress Notes (Signed)
Patient ID: Douglas Clark, male   DOB: 28-Sep-1940, 74 y.o.   MRN: [id number] Better, less pain. No weakness. Had a bm

## 2015-02-09 MED ORDER — FLEET ENEMA 7-19 GM/118ML RE ENEM: 1.0000 | ENEMA | Freq: Every day | RECTAL | Status: DC | PRN

## 2015-02-09 NOTE — Progress Notes (Signed)
Patient ID: Douglas Clark, male   DOB: 08/12/1940, 74 y.o.   MRN: [id number] Better. Less pain. Wound dry. Bladder working well. Mild bm. Pt to see.

## 2015-02-10 ENCOUNTER — Encounter
Admission: RE | Admit: 2015-02-10 | Discharge: 2015-02-10 | Disposition: A | Discharge status: Home / Self Care | Payer: Medicare Other | Source: Ambulatory Visit | Attending: Internal Medicine | Admitting: Internal Medicine

## 2015-02-10 NOTE — Discharge Summary (Signed)
Physician Discharge Summary  Patient ID: Douglas Clark MRN: [id number] DOB/AGE: 72/04/1940 72 y.o.  Admit date: 02/04/2015 Discharge date: 02/10/2015  Admission Diagnoses:lumbar stenosis  Discharge Diagnoses:  Active Problems:   Lumbar stenosis with neurogenic claudication   Discharged Condition: no weakness  Hospital Course: surgery  Consults: none  Significant Diagnostic Studies: myelogram  Treatments:lumbar laminectomies  Discharge Exam: Blood pressure 147/80, pulse 93, temperature 98.7 F (37.1 C), temperature source Oral, resp. rate 20, height 5\' 9"  (1.753 m), weight 88.905 kg (196 lb), SpO2 98 %. Wound dry. No weakness  Disposition: SNF  To see me in 2 weeks     Medication List    ASK your doctor about these medications        acetaminophen 500 MG tablet  Commonly known as:  TYLENOL  Take 500 mg by mouth every 6 (six) hours as needed. pain     aspirin 81 MG chewable tablet  Chew 81 mg by mouth daily.     carvedilol 12.5 MG tablet  Commonly known as:  COREG  Take 1 tablet (12.5 mg total) by mouth 2 (two) times daily with a meal.     cholecalciferol 1000 UNITS tablet  Commonly known as:  VITAMIN D  Take 2,000 Units by mouth daily.     clonazePAM 2 MG tablet  Commonly known as:  KLONOPIN  Take 2 mg by mouth at bedtime.     esomeprazole 20 MG capsule  Commonly known as:  NEXIUM  Take 20 mg by mouth daily.     HYDROcodone-acetaminophen 5-325 MG tablet  Commonly known as:  NORCO/VICODIN  Take 1 tablet by mouth every 6 (six) hours as needed for moderate pain.     meloxicam 15 MG tablet  Commonly known as:  MOBIC  Take 15 mg by mouth daily.     MULTI-VITAMINS Tabs  Take 1 tablet by mouth daily.     nitroGLYCERIN 0.4 MG SL tablet  Commonly known as:  NITROSTAT  Place 1 tablet (0.4 mg total) under the tongue every 5 (five) minutes as needed for chest pain.     simvastatin 20 MG tablet  Commonly known as:  ZOCOR  Take 20 mg by mouth daily.         Signed: Floyce Stakes 02/10/2015, 2:03 PM

## 2015-02-10 NOTE — Clinical Social Work Placement (Signed)
   CLINICAL SOCIAL WORK PLACEMENT  NOTE  Date:  02/10/2015  Patient Details  Name: Douglas Clark MRN: [id number] Date of Birth: 09-18-1940  Clinical Social Work is seeking post-discharge placement for this patient at the [place] level of care (*CSW will initial, date and re-position this form in  chart as items are completed):  Yes   Patient/family provided with [facility] Work Department's list of facilities offering this level of care within the geographic area requested by the patient (or if unable, by the patient's family).  Yes   Patient/family informed of their freedom to choose among providers that offer the needed level of care, that participate in Medicare, Medicaid or managed care program needed by the patient, have an available bed and are willing to accept the patient.  Yes   Patient/family informed of [facility]'s ownership interest in [facility] and [facility], as well as of the fact that they are under no obligation to receive care at these facilities.  PASRR submitted to EDS on 02/10/15     PASRR number received on 02/10/15     Existing PASRR number confirmed on       FL2 transmitted to all facilities in geographic area requested by pt/family on 02/10/15     FL2 transmitted to all facilities within larger geographic area on       Patient informed that his/her managed care company has contracts with or will negotiate with certain facilities, including the following:        Yes   Patient/family informed of bed offers received.  Patient chooses bed at [facility]     Physician recommends and patient chooses bed at      Patient to be transferred to [facility] on 02/10/15.  Patient to be transferred to facility by [facility]     Patient family notified on 02/10/15 of transfer.  Name of family member notified:  Nydia Bouton     PHYSICIAN       Additional Comment:     _______________________________________________ Caroline Sauger, LCSW 02/10/2015, 2:45 PM

## 2015-02-10 NOTE — Progress Notes (Signed)
Report called to [facility] at [facility] in [place]. Wendee Copp

## 2015-02-10 NOTE — NC FL2 (Signed)
[place] MEDICAID FL2 LEVEL OF CARE SCREENING TOOL     IDENTIFICATION  Patient Name: Douglas Clark Birthdate: 12-Dec-1940 Sex: male Admission Date (Current Location): 02/04/2015  [facility] and [place] Number: Herbalist and Address:  [facility] [place]. [facility], [place] [address] [place], [place] [address]      Provider Number: [id number]  Attending Physician Name and Address:  Leeroy Cha, MD  Relative Name and Phone Number:       Current Level of Care: Hospital Recommended Level of Care: [place] Prior Approval Number:    Date Approved/Denied:   PASRR Number: [id number]  Discharge Plan: SNF    Current Diagnoses: Patient Active Problem List   Diagnosis Date Noted  . Lumbar stenosis with neurogenic claudication 02/04/2015  . Bifascicular block 12/13/2013  . CAD (coronary artery disease) of artery bypass graft 12/13/2013  . Essential hypertension 12/13/2013  . Anxiety and depression 12/13/2013  . Tobacco use 12/13/2013    Orientation ACTIVITIES/SOCIAL BLADDER RESPIRATION    Self    Continent    BEHAVIORAL SYMPTOMS/MOOD NEUROLOGICAL BOWEL NUTRITION STATUS      Continent    PHYSICIAN VISITS COMMUNICATION OF NEEDS Height & Weight Skin    Verbally 5\' 9"  (175.3 cm) 196 lbs.            AMBULATORY STATUS RESPIRATION    Supervision limited        Personal Care Assistance Level of Assistance  Bathing, Dressing, Feeding Bathing Assistance: Limited assistance Feeding assistance: Limited assistance Dressing Assistance: Limited assistance      Functional Limitations [place]  PT (By licensed PT), OT (By licensed OT)                   Additional Factors Info  Code Status, Allergies, Psychotropic Code Status Info: FULL Allergies Info: Doxazosin, Trazadone, Nefazodone, Hctz Psychotropic Info: Clonazepam 2mg  Qhs         Current Medications (02/10/2015): Current  Facility-Administered Medications  Medication Dose Route Frequency Provider Last Rate Last Dose  . 0.9 %  sodium chloride infusion  250 mL Intravenous Continuous Leeroy Cha, MD   250 mL at 02/04/15 1801  . 0.9 %  sodium chloride infusion   Intravenous Continuous Leeroy Cha, MD 75 mL/hr at 02/05/15 0809 75 mL/hr at 02/05/15 0809  . acetaminophen (TYLENOL) tablet 650 mg  650 mg Oral Q4H PRN Leeroy Cha, MD   650 mg at 02/07/15 2205   Or  . acetaminophen (TYLENOL) suppository 650 mg  650 mg Rectal Q4H PRN Leeroy Cha, MD      . aspirin chewable tablet 81 mg  81 mg Oral Daily Leeroy Cha, MD   81 mg at 02/10/15 1027  . bisacodyl (DULCOLAX) suppository 10 mg  10 mg Rectal Daily PRN Leeroy Cha, MD   10 mg at 02/07/15 2024  . carvedilol (COREG) tablet 12.5 mg  12.5 mg Oral BID WC Leeroy Cha, MD   12.5 mg at 02/10/15 0805  . chlorproMAZINE (THORAZINE) tablet 25 mg  25 mg Oral Q6H PRN Leeroy Cha, MD   25 mg at 02/08/15 1100  . clonazePAM (KLONOPIN) tablet 2 mg  2 mg Oral QHS Leeroy Cha, MD   2 mg at 02/09/15 2119  . diazepam (VALIUM) tablet 5 mg  5 mg Oral Q6H PRN Leeroy Cha, MD   5 mg at 02/08/15 1000  .  docusate sodium (COLACE) capsule 100 mg  100 mg Oral BID Leeroy Cha, MD   100 mg at 02/10/15 1027  . gi cocktail (Maalox,Lidocaine,Donnatal)  30 mL Oral TID PRN Ashok Pall, MD   30 mL at 02/07/15 2024  . lactated ringers infusion   Intravenous Continuous Kate Sable, MD 10 mL/hr at 02/04/15 1045    . menthol-cetylpyridinium (CEPACOL) lozenge 3 mg  1 lozenge Oral PRN Leeroy Cha, MD   3 mg at 02/06/15 0008   Or  . phenol (CHLORASEPTIC) mouth spray 1 spray  1 spray Mouth/Throat PRN Leeroy Cha, MD   1 spray at 02/04/15 2048  . morphine 2 MG/ML injection 1 mg  1 mg Intravenous Q3H PRN Leeroy Cha, MD   1 mg at 02/10/15 1146  . nitroGLYCERIN (NITROSTAT) SL tablet 0.4 mg  0.4 mg Sublingual Q5 min PRN Leeroy Cha, MD      . ondansetron [facility])  injection 4 mg  4 mg Intravenous Q4H PRN Leeroy Cha, MD   4 mg at 02/05/15 0033  . oxyCODONE-acetaminophen (PERCOCET/ROXICET) 5-325 MG per tablet 1-2 tablet  1-2 tablet Oral Q4H PRN Leeroy Cha, MD   1 tablet at 02/10/15 0359  . pantoprazole (PROTONIX) EC tablet 40 mg  40 mg Oral Daily Leeroy Cha, MD   40 mg at 02/10/15 1027  . polyethylene glycol (MIRALAX / GLYCOLAX) packet 17 g  17 g Oral Daily PRN Leeroy Cha, MD      . simvastatin (ZOCOR) tablet 20 mg  20 mg Oral q1800 Leeroy Cha, MD   20 mg at 02/09/15 1815  . sodium chloride 0.9 % injection 3 mL  3 mL Intravenous Q12H Leeroy Cha, MD   3 mL at 02/09/15 2123  . sodium chloride 0.9 % injection 3 mL  3 mL Intravenous PRN Leeroy Cha, MD      . sodium phosphate (FLEET) 7-19 GM/118ML enema 1 enema  1 enema Rectal Daily PRN Leeroy Cha, MD      . tamsulosin (FLOMAX) capsule 0.8 mg  0.8 mg Oral Daily Leeroy Cha, MD   0.8 mg at 02/10/15 1027   Do not use this list as official medication orders. Please verify with discharge summary.  Discharge Medications:   Medication List    ASK your doctor about these medications        acetaminophen 500 MG tablet  Commonly known as:  TYLENOL  Take 500 mg by mouth every 6 (six) hours as needed. pain     aspirin 81 MG chewable tablet  Chew 81 mg by mouth daily.     carvedilol 12.5 MG tablet  Commonly known as:  COREG  Take 1 tablet (12.5 mg total) by mouth 2 (two) times daily with a meal.     cholecalciferol 1000 UNITS tablet  Commonly known as:  VITAMIN D  Take 2,000 Units by mouth daily.     clonazePAM 2 MG tablet  Commonly known as:  KLONOPIN  Take 2 mg by mouth at bedtime.     esomeprazole 20 MG capsule  Commonly known as:  NEXIUM  Take 20 mg by mouth daily.     HYDROcodone-acetaminophen 5-325 MG tablet  Commonly known as:  NORCO/VICODIN  Take 1 tablet by mouth every 6 (six) hours as needed for moderate pain.     meloxicam 15 MG tablet  Commonly known  as:  MOBIC  Take 15 mg by mouth daily.     MULTI-VITAMINS Tabs  Take 1 tablet by mouth daily.  nitroGLYCERIN 0.4 MG SL tablet  Commonly known as:  NITROSTAT  Place 1 tablet (0.4 mg total) under the tongue every 5 (five) minutes as needed for chest pain.     simvastatin 20 MG tablet  Commonly known as:  ZOCOR  Take 20 mg by mouth daily.        Relevant Imaging Results:  Relevant Lab Results:  Recent Labs    Additional Information SSN [id number]  Dulcy Fanny, LCSW

## 2015-02-10 NOTE — Discharge Planning (Signed)
Patient to be discharged to [facility]. Patient's wife, [name], updated regarding discharge.  Facility: [facility] RN report number: [phone] Transportation: [place], [place] Orthopedics: [phone] Surgical: [phone]

## 2015-02-10 NOTE — Care Management Important Message (Signed)
Important Message  Patient Details  Name: Douglas Clark MRN: [id number] Date of Birth: Dec 05, 1940   Medicare Important Message Given:  Yes    Maryse Brierley P Vijay Durflinger 02/10/2015, 3:35 PM

## 2015-02-10 NOTE — Progress Notes (Signed)
Pt discharged to skilled nursing facility for rehab. Transported from unit via Consulting civil engineer at 1410. Wendee Copp

## 2015-02-10 NOTE — Progress Notes (Signed)
   02/10/15 1320  Clinical Encounter Type  Visited With Family;Health care provider  Visit Type Initial   Unit chaplain introduced himself to the patient's wife and checked to see how things were going. According to the wife, the patient is highly likely to get discharged to a nearby rehab facility, and she is grateful and excited about that. Chaplain offered support, and our services are available as needed.   Jeri Lager, Chaplain 02/10/2015 1:21 PM

## 2015-02-10 NOTE — Progress Notes (Signed)
Physical Therapy Treatment Patient Details Name: Douglas Clark MRN: [id number] DOB: Aug 10, 1940 Today's Date: 02/10/2015    History of Present Illness Bilateral L3, L4, L5 laminectomy, partial L2 laminectomy.Pt's PMH includes emphysema, MI, anxiety.    PT Comments    Wife encouraged to leave room and "take a break" as she reports pt has been exhausting her with his restlessness and demands. Patient tolerated incr distance and required less physical assist (however still requires constant cues).   Follow Up Recommendations  SNF;Supervision/Assistance - 24 hour     Equipment Recommendations  Other (comment) (TBD at next venue of care and as pt progresses)    Recommendations for Other Services       Precautions / Restrictions Precautions Precautions: Back;Fall Precaution Comments: pt cannot recall any precautions; wife not present Required Braces or Orthoses: Spinal Brace Spinal Brace: Lumbar corset;Applied in sitting position Restrictions Weight Bearing Restrictions: No    Mobility  Bed Mobility                  Transfers Overall transfer level: Needs assistance Equipment used: Rolling walker (2 wheeled) Transfers: Sit to/from Stand Sit to Stand: Min assist         General transfer comment: from recliner x 2  Ambulation/Gait Ambulation/Gait assistance: Min assist Ambulation Distance (Feet): 110 Feet (seated rest, 110) Assistive device: Rolling walker (2 wheeled) Gait Pattern/deviations: Step-through pattern;Decreased stride length;Shuffle   Gait velocity interpretation: Below normal speed for age/gender General Gait Details: initial slight posterior lean (min assist to recover); max cues to lengthen steps and clear feet (shuffles); even pushed RW faster to try to get pt to elongate and quicken steps (less successful)   Stairs            Wheelchair Mobility    Modified Rankin (Stroke Patients Only)       Balance                                     Cognition Arousal/Alertness: Awake/alert Behavior During Therapy: Flat affect Overall Cognitive Status: Impaired/Different from baseline Area of Impairment: Attention;Memory;Safety/judgement;Awareness;Problem solving;Following commands   Current Attention Level: Sustained Memory: Decreased recall of precautions;Decreased short-term memory Following Commands: Follows one step commands with increased time Safety/Judgement: Decreased awareness of safety;Decreased awareness of deficits Awareness: Intellectual Problem Solving: Slow processing;Decreased initiation;Difficulty sequencing;Requires verbal cues;Requires tactile cues General Comments: continues with slow processing; not as lethargic    Exercises      General Comments        Pertinent Vitals/Pain Pain Assessment: 0-10 Pain Score: 8  Pain Location: lumbar Pain Descriptors / Indicators: Operative site guarding Pain Intervention(s): Limited activity within patient's tolerance;Monitored during session;Repositioned;Patient requesting pain meds-RN notified    Home Living                      Prior Function            PT Goals (current goals can now be found in the care plan section) Acute Rehab PT Goals Patient Stated Goal: wife is for pt to have a good quality of life Time For Goal Achievement: 02/26/15 Progress towards PT goals: Progressing toward goals    Frequency  Min 5X/week    PT Plan Current plan remains appropriate    Co-evaluation             End of Session Equipment Utilized During Treatment: Gait belt;Back brace  Activity Tolerance: Patient limited by fatigue Patient left: in chair;with call bell/phone within reach;with nursing/sitter in room (RN made aware no chair alarm in room; tech located one)     Time: [id number] PT Time Calculation (min) (ACUTE ONLY): 25 min  Charges:  $Gait Training: 23-37 mins                    G Codes:       Yaretsi Humphres 03-12-2015, 12:14 PM Pager [phone]

## 2015-02-10 NOTE — Clinical Social Work Note (Signed)
Clinical Social Work Assessment  Patient Details  Name: Douglas Clark MRN: [id number] Date of Birth: [birth year]  Date of referral:  02/10/15               Reason for consult:  Facility Placement, Discharge Planning                Permission sought to share information with:  Facility Sport and exercise psychologist, Family Supports Permission granted to share information::  Yes, Verbal Permission Granted  Name::     Shriram Jin  Agency::  [facility]  Relationship::  Spouse  Contact Information:  [phone]  Housing/Transportation Living arrangements for the past 2 months:  [place] of Information:  Spouse Patient Interpreter Needed:  None Criminal Activity/Legal Involvement Pertinent to Current Situation/Hospitalization:  No - Comment as needed Significant Relationships:  Spouse Lives with:  Spouse Do you feel safe going back to the place where you live?  No (High fall risk.) Need for family participation in patient care:  Yes (Comment) (Patient's wife active in patient's care.)  Care giving concerns:  Patient's wife expressed no concerns at this time.   Social Worker assessment / plan:  CSW received referral for possible SNF placement at time of discharge. CSW spoke with patient's wife via phone regarding discharge planning. Per patient's wife, patient's wife prefers for patient to be discharged to [facility]. CSW to continue to follow and assist with discharge planning needs.  Employment status:  Retired Forensic scientist:  Medicare PT Recommendations:  [place] / Referral to community resources:  [place]  Patient/Family's Response to care:  Patient's wife understanding and agreeable to CSW plan of care.  Patient/Family's Understanding of and Emotional Response to Diagnosis, Current Treatment, and Prognosis:  Patient's wife understanding and agreeable to CSW plan of care.  Emotional  Assessment Appearance:  Appears stated age Attitude/Demeanor/Rapport:  Other (CSW spoke with patient's wife, [name].) Affect (typically observed):  Other (CSW spoke with patient's wife, [name].) Orientation:  Oriented to Self Alcohol / Substance use:  Not Applicable Psych involvement (Current and /or in the community):  No (Comment) (Not appropriate on admissions.)  Discharge Needs  Concerns to be addressed:  No discharge needs identified Readmission within the last 30 days:  No Current discharge risk:  None Barriers to Discharge:  No Barriers Identified   Caroline Sauger, LCSW 02/10/2015, 2:36 PM [phone]

## 2015-02-12 LAB — URINALYSIS COMPLETE WITH MICROSCOPIC (ARMC ONLY)
BILIRUBIN URINE: NEGATIVE
Bacteria, UA: NONE SEEN
GLUCOSE, UA: NEGATIVE mg/dL
LEUKOCYTES UA: NEGATIVE
NITRITE: NEGATIVE
Protein, ur: 30 mg/dL — AB
Specific Gravity, Urine: 1.025 (ref 1.005–1.030)
Squamous Epithelial / LPF: NONE SEEN
pH: 5 (ref 5.0–8.0)

## 2015-02-14 LAB — URINE CULTURE

## 2015-02-20 ENCOUNTER — Encounter
Admission: RE | Admit: 2015-02-20 | Discharge: 2015-02-20 | Disposition: A | Discharge status: Home / Self Care | Payer: Medicare Other | Source: Ambulatory Visit | Attending: Internal Medicine | Admitting: Internal Medicine

## 2015-03-06 ENCOUNTER — Ambulatory Visit
Admission: RE | Admit: 2015-03-06 | Discharge: 2015-03-06 | Disposition: A | Discharge status: Home / Self Care | Payer: Medicare Other | Source: Ambulatory Visit | Attending: Internal Medicine | Admitting: Internal Medicine

## 2015-03-06 DIAGNOSIS — R221 Localized swelling, mass and lump, neck: Secondary | ICD-10-CM | POA: Diagnosis present

## 2015-03-06 MED ORDER — IOHEXOL 300 MG/ML  SOLN
75.0000 mL | Freq: Once | INTRAMUSCULAR | Status: AC | PRN
  Administered 2015-03-06: 75 mL via INTRAVENOUS

## 2015-06-11 ENCOUNTER — Ambulatory Visit (INDEPENDENT_AMBULATORY_CARE_PROVIDER_SITE_OTHER): Payer: Medicare Other | Admitting: Neurology

## 2015-06-11 ENCOUNTER — Encounter: Payer: Self-pay | Admitting: Neurology

## 2015-06-11 VITALS — BP 146/85 | HR 60 | Ht 69.0 in | Wt 186.0 lb

## 2015-06-11 DIAGNOSIS — R413 Other amnesia: Secondary | ICD-10-CM | POA: Diagnosis not present

## 2015-06-11 DIAGNOSIS — R269 Unspecified abnormalities of gait and mobility: Secondary | ICD-10-CM | POA: Diagnosis not present

## 2015-06-11 NOTE — Progress Notes (Signed)
PATIENT: Douglas Clark DOB: 09-06-1940  Chief Complaint  Patient presents with  . Memory Loss    MMSE 27/30 - 10 animals.  He is here with his wife, Douglas Clark, to have his memory evaluated.  . Abnormal Gait    He is having more difficulty walking and uses a cane for balance.     HISTORICAL  Douglas Clark is a 75 years old right-handed male, accompanied by his wife Douglas Clark, seen in refer by neurosurgeon Dr. Courtney Paris evaluation of memory loss, gait difficulty, his primary care physician is Dr. Doy Hutching.  He had a history of hypertension, hyperlipidemia, emphysema, [phone], he had sudden onset loss of consciousness, fell down 15 steps, he had transient retroamnesia then he also had a history of lumbar degenerative disc disease, had  lumbar decompression surgery from L2-L5 by neurosurgeon Dr. Joya Salm in November 2016, prior to the surgery, he has significant low back pain, gait difficulty, he attributed to his low back pain, he no longer has significant low back pain, only complains 2 out of 10 mild midline low back pain, but he was noted has worsening gait difficulty, he tends to bending forward, small shuffling gait, he also complains of urinary urgency, denies incontinence,  I personally reviewed his MRI of lumbar in October 2016 prior to his most recent lumbar decompression surgery,Advanced degenerative lumbar spondylosis with significant multilevel disc disease and facet disease. Moderate right foraminal stenosis at L2-3 due to disc osteophyte complex. There is also right lateral recess stenosis due to disc protrusion and spurring change. Moderate to moderately severe multifactorial spinal and bilateral lateral recess stenosis at L3-4. Severe multifactorial spinal and bilateral lateral recess stenosis at L4-5. There is also mild to moderate bilateral foraminal stenosis. Moderate bilateral lateral recess and bilateral foraminal stenosis at L5-S1  He denies significant low back pain, no bilateral  upper or lower extremity paresthesia, no significant weakness,  In addition, wife also reported that he has gradual onset mild memory trouble, he tends to repeat conversation, keep APPOINTMENT on the calendar to keep up, he had 14 years of education, was disabled since he fell in 1993,  He denies agnosia, no REM sleep disorder, he does get up 3 times using bathroom almost every night, he denies dysphagia, no dysarthria, no chewing difficulty,   REVIEW OF SYSTEMS: Full 14 system review of systems performed and notable only for as above   ALLERGIES: Allergies  Allergen Reactions  . Doxazosin Hives  . Trazodone And Nefazodone Other (See Comments)    shivers  . Hctz [Hydrochlorothiazide] Rash    blisters    HOME MEDICATIONS: Current Outpatient Prescriptions  Medication Sig Dispense Refill  . acetaminophen (TYLENOL) 500 MG tablet Take 500 mg by mouth every 6 (six) hours as needed. pain    . aspirin 81 MG chewable tablet Chew 81 mg by mouth daily.    . carvedilol (COREG) 12.5 MG tablet Take 1 tablet (12.5 mg total) by mouth 2 (two) times daily with a meal. 60 tablet 10  . cholecalciferol (VITAMIN D) 1000 UNITS tablet Take 2,000 Units by mouth daily.    . clonazePAM (KLONOPIN) 2 MG tablet Take 2 mg by mouth at bedtime.    Marland Kitchen esomeprazole (NEXIUM) 20 MG capsule Take 20 mg by mouth daily.    Marland Kitchen HYDROcodone-acetaminophen (NORCO/VICODIN) 5-325 MG per tablet Take 1 tablet by mouth every 6 (six) hours as needed for moderate pain.    Marland Kitchen losartan (COZAAR) 50 MG tablet Take by mouth.    Marland Kitchen  Multiple Vitamin (MULTI-VITAMINS) TABS Take 1 tablet by mouth daily.    . nitroGLYCERIN (NITROSTAT) 0.4 MG SL tablet Place 1 tablet (0.4 mg total) under the tongue every 5 (five) minutes as needed for chest pain. 25 tablet 3  . simvastatin (ZOCOR) 20 MG tablet Take 20 mg by mouth daily.     No current facility-administered medications for this visit.    PAST MEDICAL HISTORY: Past Medical History  Diagnosis Date    . Myocardial infarction ([place])   . Hypertension   . Emphysema lung ([place])   . Anxiety   . History of kidney stones   . GERD (gastroesophageal reflux disease)   . Concussion 1993    with long term memory loss  . Chronic fatigue and malaise     with STM loss  . HH (hiatus hernia)     PAST SURGICAL HISTORY: Past Surgical History  Procedure Laterality Date  . Coronary artery bypass graft      2000      . Kidney surgery      REMOVED STONE FROM RT KIDNEY  . Lithotripsy      X2   . Lumbar laminectomy/decompression microdiscectomy Bilateral 02/04/2015    Procedure: LUMBAR LAMINECTOMY/DECOMPRESSION MICRODISCECTOMY  Bilateral - Lumbar two-three lumbar three-four lumbar four-five lumbar five sacral one ;  Surgeon: Leeroy Cha, MD;  Location: [facility];  Service: Neurosurgery;  Laterality: Bilateral;    FAMILY HISTORY: Family History  Problem Relation Age of Onset  . Heart disease Father   . Varicose Veins Mother     SOCIAL HISTORY:  Social History   Social History  . Marital Status: Married    Spouse Name: N/A  . Number of Children: 3  . Years of Education: 14   Occupational History  . Retired    Social History Main Topics  . Smoking status: Current Every Day Smoker -- 1.00 packs/day    Types: Cigarettes  . Smokeless tobacco: Not on file  . Alcohol Use: 0.0 oz/week    0 Standard drinks or equivalent per week     Comment: OCC  . Drug Use: No  . Sexual Activity: Yes    Birth Control/ Protection: Post-menopausal   Other Topics Concern  . Not on file   Social History Narrative   Lives at home with his wife.   Right-handed.   1/2 cup of coffee per day.     PHYSICAL EXAM   Filed Vitals:   06/11/15 1606  BP: 146/85  Pulse: 60  Height: 5\' 9"  (1.753 m)  Weight: 186 lb (84.369 kg)    Not recorded      Body mass index is 27.45 kg/(m^2).  PHYSICAL EXAMNIATION:  Gen: NAD, conversant, well nourised, obese, well groomed                      Cardiovascular: Regular rate rhythm, no peripheral edema, warm, nontender. Eyes: Conjunctivae clear without exudates or hemorrhage Neck: Supple, no carotid bruise. Pulmonary: Clear to auscultation bilaterally   NEUROLOGICAL EXAM:  MENTAL STATUS: Speech:    Speech is normal; fluent and spontaneous with normal comprehension.  Cognition:Mini-Mental Status Examination 27/30, animal naming 10     Orientation to time, place and person: He is not oriented to date, month,     Recent and remote memory: He missed 1/3 recalls     Normal Attention span and concentration     Normal Language, naming, repeating,spontaneous speech     Fund of knowledge  CRANIAL NERVES: CN II: Visual fields are full to confrontation. Fundoscopic exam is normal with sharp discs and no vascular changes. Pupils are round equal and briskly reactive to light. CN III, IV, VI: extraocular movement are normal. No ptosis. CN V: Facial sensation is intact to pinprick in all 3 divisions bilaterally. Corneal responses are intact.  CN VII: Face is symmetric with normal eye closure and smile. CN VIII: Hearing is normal to rubbing fingers CN IX, X: Palate elevates symmetrically. Phonation is normal. CN XI: Head turning and shoulder shrug are intact CN XII: Tongue is midline with normal movements and no atrophy.  MOTOR: There is no pronator drift of out-stretched arms. Muscle bulk and tone are normal. Muscle strength is normal.  REFLEXES: Reflexes are 3 and symmetric at the biceps, triceps, knees, and trace ankles. Plantar responses are extensor  SENSORY : Length dependent decreased to light touch, pinprick, and vibratory sensation at toes COORDINATION: Rapid alternating movements and fine finger movements are intact. There is no dysmetria on finger-to-nose and heel-knee-shin.    GAIT/STANCE: He need to push up to get up from seated position, bending forward, stiff, wide based, cautious unsteady gait, is able to stand on  tiptoe, heels, difficulty perform tandem walking,   DIAGNOSTIC DATA (LABS, IMAGING, TESTING) - I reviewed patient records, labs, notes, testing and imaging myself where available.   ASSESSMENT AND PLAN  Douglas Clark is a 75 y.o. male   Gait abnormality   he has no significant bilateral lower extremity weakness which is usually associated with lumbar stenosis to account for his complains of gait difficulty, he does have hyperreflexia,    differentiation diagnosis also includes cervical spondylitic myelopathy, versus periventricular white matter disease Proceed with MRI of cervical spine Continue PT  Memory loss   MRI of brain Laboratory evaluation  Douglas Clark, M.D. Ph.D.  [facility] [address] [place], [place] [address] Ph: [phone] Fax: [phone]  CC: Leeroy Cha, MD,Jeffrey Lennice Sites, MD

## 2015-06-12 LAB — TSH: TSH: 0.858 u[IU]/mL (ref 0.450–4.500)

## 2015-06-12 LAB — CK: CK TOTAL: 48 U/L (ref 24–204)

## 2015-06-12 LAB — RPR: RPR Ser Ql: NONREACTIVE

## 2015-06-12 LAB — VITAMIN B12: VITAMIN B 12: 299 pg/mL (ref 211–946)

## 2015-06-23 ENCOUNTER — Ambulatory Visit
Admission: RE | Admit: 2015-06-23 | Discharge: 2015-06-23 | Disposition: A | Discharge status: Home / Self Care | Payer: Medicare Other | Source: Ambulatory Visit | Attending: Neurology | Admitting: Neurology

## 2015-06-23 DIAGNOSIS — R269 Unspecified abnormalities of gait and mobility: Secondary | ICD-10-CM

## 2015-06-23 DIAGNOSIS — R413 Other amnesia: Secondary | ICD-10-CM

## 2015-06-24 ENCOUNTER — Telehealth: Payer: Self-pay | Admitting: Neurology

## 2015-06-24 DIAGNOSIS — M542 Cervicalgia: Secondary | ICD-10-CM

## 2015-06-24 DIAGNOSIS — M48062 Spinal stenosis, lumbar region with neurogenic claudication: Secondary | ICD-10-CM

## 2015-06-24 NOTE — Telephone Encounter (Signed)
They are aware of his MRI results. He is willing to proceed with his NCV/EMG. His pain is actually in his low back and his PCP has provided him with hydrocodone.

## 2015-06-24 NOTE — Telephone Encounter (Addendum)
Pt's wife said he was seen by PCP and went over MRI with them as best he could. PCP said he should be seen by Dr Krista Blue asap that he had moderate to severe damage at C4,C6,C7. She is inquiring if pt should see Dr Joya Salm now? He is in severe pain.

## 2015-06-24 NOTE — Telephone Encounter (Signed)
I have reviewed his MRI of the brain and MRI of the cervical spine, please call patient, MRI of the brain showed age related changes, atrophy, supratentorium small vessel disease, MRI of cervical spine showed evidence of multilevel degenerative disc disease, most severe at C 3-4, with severe right foraminal stenosis, potentially impingement at right C4 nerve roots, C6 and 7, with evidence of bilateral foraminal stenosis  Please check on what kind of neck pain, does he has right cervical radicular pain? If he does, he may benefit gabapentin 300 mg 3 times a day,  I have put in the order for EMG nerve conduction study,  I need to complete the task, reexamine him again before making decision about neurosurgical referral,   This is an abnormal MRI of the cervical spine showing the following: 1. At C3-C4 there is moderately severe right foraminal narrowing, mostly due to facet hypertrophy. There could be impingement of the right C4 nerve root. 2. At C4-C5, there is minimal anterolisthesis likely due to right greater than left facet hypertrophy. There is no nerve root impingement. 3. At C5-C6, there is very minimal anterolisthesis likely due to left greater than right facet hypertrophy. There is no nerve root impingement. 4. At C6-C7, there is reduced disc height, uncovertebral spurring and disc bulging causing moderately severe right and moderate left foraminal narrowing. There could be right C7 nerve root impingement 5. The spinal cord appears normal.   IMPRESSION: This MRI of the brain without contrast shows the following: 1. Moderate cortical atrophy 2. Multiple T2/FLAIR hyperintense foci in the pons, thalami and hemispheres. These are nonspecific but most consistent with chronic microvascular ischemic change. The extent is more than expected for age. 3. Minimal chronic ethmoid sinusitis.  4. There are no acute findings.

## 2015-07-01 ENCOUNTER — Ambulatory Visit: Payer: Medicare Other | Admitting: Neurology

## 2015-07-09 ENCOUNTER — Ambulatory Visit: Payer: Medicare Other | Admitting: Neurology

## 2015-07-09 ENCOUNTER — Telehealth: Payer: Self-pay | Admitting: Neurology

## 2015-07-09 ENCOUNTER — Ambulatory Visit (INDEPENDENT_AMBULATORY_CARE_PROVIDER_SITE_OTHER): Payer: Medicare Other | Admitting: Neurology

## 2015-07-09 DIAGNOSIS — R413 Other amnesia: Secondary | ICD-10-CM

## 2015-07-09 DIAGNOSIS — R269 Unspecified abnormalities of gait and mobility: Secondary | ICD-10-CM

## 2015-07-09 DIAGNOSIS — M48062 Spinal stenosis, lumbar region with neurogenic claudication: Secondary | ICD-10-CM

## 2015-07-09 DIAGNOSIS — G5602 Carpal tunnel syndrome, left upper limb: Secondary | ICD-10-CM

## 2015-07-09 DIAGNOSIS — M4806 Spinal stenosis, lumbar region: Secondary | ICD-10-CM | POA: Diagnosis not present

## 2015-07-09 DIAGNOSIS — M542 Cervicalgia: Secondary | ICD-10-CM

## 2015-07-09 NOTE — Telephone Encounter (Signed)
Spoke to [place] (pt's wife on [employer]) - all appts have been coordinated on 07/15/15 - she is aware of times and locations.

## 2015-07-09 NOTE — Telephone Encounter (Signed)
Douglas Clark, please check on his fluoroscopy guided lumbar puncture schedule, he needs a large volume lumbar puncture, I need to see him before and after lumbar puncture at the same day.  He has travel plans Aug 02 2015, ideally we should have the procedure done within a week

## 2015-07-10 DIAGNOSIS — G5600 Carpal tunnel syndrome, unspecified upper limb: Secondary | ICD-10-CM | POA: Insufficient documentation

## 2015-07-10 NOTE — Progress Notes (Signed)
PATIENT: Douglas Clark DOB: 07-May-1940  No chief complaint on file.    HISTORICAL  Douglas Clark is a 75 years old right-handed male, accompanied by his wife Douglas Clark, seen in refer by neurosurgeon Dr. Courtney Paris evaluation of memory loss, gait difficulty, his primary care physician is Dr. Doy Hutching.  He had a history of hypertension, hyperlipidemia, emphysema, [phone], he had sudden onset loss of consciousness, fell down 15 steps, he had transient retroamnesia then he also had a history of lumbar degenerative disc disease, had  lumbar decompression surgery from L2-L5 by neurosurgeon Dr. Joya Salm in November 2016, prior to the surgery, he has significant low back pain, gait difficulty, he attributed to his low back pain, he no longer has significant low back pain, only complains 2 out of 10 mild midline low back pain, but he was noted has worsening gait difficulty, he tends to bending forward, small shuffling gait, he also complains of urinary urgency, denies incontinence,  I personally reviewed his MRI of lumbar in October 2016 prior to his most recent lumbar decompression surgery,Advanced degenerative lumbar spondylosis with significant multilevel disc disease and facet disease. Moderate right foraminal stenosis at L2-3 due to disc osteophyte complex. There is also right lateral recess stenosis due to disc protrusion and spurring change. Moderate to moderately severe multifactorial spinal and bilateral lateral recess stenosis at L3-4. Severe multifactorial spinal and bilateral lateral recess stenosis at L4-5. There is also mild to moderate bilateral foraminal stenosis. Moderate bilateral lateral recess and bilateral foraminal stenosis at L5-S1  He denies significant low back pain, no bilateral upper or lower extremity paresthesia, no significant weakness,  In addition, wife also reported that he has gradual onset mild memory trouble, he tends to repeat conversation, keep APPOINTMENT on the  calendar to keep up, he had 14 years of education, was disabled since he fell in 1993,  He denies agnosia, no REM sleep disorder, he does get up 3 times using bathroom almost every night, he denies dysphagia, no dysarthria, no chewing difficulty,  UPDATE July 09 2015: Patient returned for electrodiagnostic study today, which showed evidence of mild bilateral lumbar sacral radiculopathy, there is no evidence of active process, the findings will certainly not explain his complains of progressive gait difficulty,  We have personally reviewed MRI of the brain without contrast in April 2017 with patient and his wife, there is evidence of mild to moderate atrophy, out of proportion ventriculomegaly, mild periventricular small vessel disease   MRI of cervical spine showed multilevel degenerative disc disease, variable degree of foraminal stenosis at different levels, but there is no evidence of significant canal stenosis or cord signal changes.  Both patient and his wife concurred, that his current gait difficulty continued to progressively getting worse, which is rather acute onset since 2016, in addition, he had developed memory trouble since October 2016, urinary urgency occasionally incontinence, he no longer has significant low back pain, he has difficulty initiate gait,   REVIEW OF SYSTEMS: Full 14 system review of systems performed and notable only for as above   ALLERGIES: Allergies  Allergen Reactions  . Doxazosin Hives  . Trazodone And Nefazodone Other (See Comments)    shivers  . Hctz [Hydrochlorothiazide] Rash    blisters    HOME MEDICATIONS: Current Outpatient Prescriptions  Medication Sig Dispense Refill  . acetaminophen (TYLENOL) 500 MG tablet Take 500 mg by mouth every 6 (six) hours as needed. pain    . aspirin 81 MG chewable tablet Chew 81 mg  by mouth daily.    . carvedilol (COREG) 12.5 MG tablet Take 1 tablet (12.5 mg total) by mouth 2 (two) times daily with a meal. 60  tablet 10  . cholecalciferol (VITAMIN D) 1000 UNITS tablet Take 2,000 Units by mouth daily.    . clonazePAM (KLONOPIN) 2 MG tablet Take 2 mg by mouth at bedtime.    Marland Kitchen esomeprazole (NEXIUM) 20 MG capsule Take 20 mg by mouth daily.    Marland Kitchen HYDROcodone-acetaminophen (NORCO/VICODIN) 5-325 MG per tablet Take 1 tablet by mouth every 6 (six) hours as needed for moderate pain.    Marland Kitchen losartan (COZAAR) 50 MG tablet Take by mouth.    . Multiple Vitamin (MULTI-VITAMINS) TABS Take 1 tablet by mouth daily.    . nitroGLYCERIN (NITROSTAT) 0.4 MG SL tablet Place 1 tablet (0.4 mg total) under the tongue every 5 (five) minutes as needed for chest pain. 25 tablet 3  . simvastatin (ZOCOR) 20 MG tablet Take 20 mg by mouth daily.     No current facility-administered medications for this visit.    PAST MEDICAL HISTORY: Past Medical History  Diagnosis Date  . Myocardial infarction ([place])   . Hypertension   . Emphysema lung ([place])   . Anxiety   . History of kidney stones   . GERD (gastroesophageal reflux disease)   . Concussion 1993    with long term memory loss  . Chronic fatigue and malaise     with STM loss  . HH (hiatus hernia)     PAST SURGICAL HISTORY: Past Surgical History  Procedure Laterality Date  . Coronary artery bypass graft      2000      . Kidney surgery      REMOVED STONE FROM RT KIDNEY  . Lithotripsy      X2   . Lumbar laminectomy/decompression microdiscectomy Bilateral 02/04/2015    Procedure: LUMBAR LAMINECTOMY/DECOMPRESSION MICRODISCECTOMY  Bilateral - Lumbar two-three lumbar three-four lumbar four-five lumbar five sacral one ;  Surgeon: Leeroy Cha, MD;  Location: [facility];  Service: Neurosurgery;  Laterality: Bilateral;    FAMILY HISTORY: Family History  Problem Relation Age of Onset  . Heart disease Father   . Varicose Veins Mother     SOCIAL HISTORY:  Social History   Social History  . Marital Status: Married    Spouse Name: N/A  . Number of Children: 3  .  Years of Education: 14   Occupational History  . Retired    Social History Main Topics  . Smoking status: Current Every Day Smoker -- 1.00 packs/day    Types: Cigarettes  . Smokeless tobacco: Not on file  . Alcohol Use: 0.0 oz/week    0 Standard drinks or equivalent per week     Comment: OCC  . Drug Use: No  . Sexual Activity: Yes    Birth Control/ Protection: Post-menopausal   Other Topics Concern  . Not on file   Social History Narrative   Lives at home with his wife.   Right-handed.   1/2 cup of coffee per day.     PHYSICAL EXAM   There were no vitals filed for this visit.  Not recorded      There is no weight on file to calculate BMI.  PHYSICAL EXAMNIATION:  Gen: NAD, conversant, well nourised, obese, well groomed                     Cardiovascular: Regular rate rhythm, no peripheral edema, warm, nontender.  Eyes: Conjunctivae clear without exudates or hemorrhage Neck: Supple, no carotid bruise. Pulmonary: Clear to auscultation bilaterally   NEUROLOGICAL EXAM:  MENTAL STATUS: Speech:    Speech is normal; fluent and spontaneous with normal comprehension.  Cognition:Mini-Mental Status Examination 27/30, animal naming 10     Orientation to time, place and person: He is not oriented to date, month,     Recent and remote memory: He missed 1/3 recalls     Normal Attention span and concentration     Normal Language, naming, repeating,spontaneous speech     Fund of knowledge   CRANIAL NERVES: CN II: Visual fields are full to confrontation. Fundoscopic exam is normal with sharp discs and no vascular changes. Pupils are round equal and briskly reactive to light. CN III, IV, VI: extraocular movement are normal. No ptosis. CN V: Facial sensation is intact to pinprick in all 3 divisions bilaterally. Corneal responses are intact.  CN VII: Face is symmetric with normal eye closure and smile. CN VIII: Hearing is normal to rubbing fingers CN IX, X: Palate elevates  symmetrically. Phonation is normal. CN XI: Head turning and shoulder shrug are intact CN XII: Tongue is midline with normal movements and no atrophy.  MOTOR: He has mild to moderate bilateral upper and lower extremity rigidity, no current rigidity, mild bradykinesia,  no significant weakness REFLEXES: Reflexes are 3 and symmetric at the biceps, triceps, knees, and trace ankles. Plantar responses are extensor  SENSORY : Length dependent decreased to light touch, pinprick, and vibratory sensation at toes COORDINATION: Rapid alternating movements and fine finger movements are intact. There is no dysmetria on finger-to-nose and heel-knee-shin.    GAIT/STANCE: He need to push up to get up from seated position, bending forward, stiff, wide based, difficulty initiate gait  DIAGNOSTIC DATA (LABS, IMAGING, TESTING) - I reviewed patient records, labs, notes, testing and imaging myself where available.   ASSESSMENT AND PLAN  EBIN MENNINGER is a 75 y.o. male   Gait abnormality    his lumbar sacral radiculopathy and low back pain likely only play a minor role in his complains of gait difficulty,   His difficulty initiate gait, symmetric bradykinesia, rigidity ventriculomegaly out of proportion of mild to moderate cortical atrophy, given a impression of normal pressure hydrocephalus,   After extensive discussion with patient and his wife, they want to proceed with further evaluation,   I have ordered large volume lumbar puncture 30 cc CSF drain under fluoroscopy guidance, I will check Mini-Mental Status Examination, evaluating his gait before and immediately after lumbar puncture   Marcial Pacas, M.D. Ph.D.  [facility] [address], [place], [place] [address] Ph: [phone] Fax: [phone]  CC: Leeroy Cha, MD,Jeffrey Lennice Sites, MD

## 2015-07-10 NOTE — Procedures (Addendum)
   NCS (NERVE CONDUCTION STUDY) WITH EMG (ELECTROMYOGRAPHY) REPORT   STUDY DATE: July 09 2015 PATIENT NAME: LENNEX ROHM DOB: 1940-10-24 MRN: [id number]    TECHNOLOGIST: Laretta Alstrom ELECTROMYOGRAPHER: Marcial Pacas M.D.  CLINICAL INFORMATION:  75 years old male with history of lumbar decompression surgery November 2016, with continued progressive gait difficulty despite lumbar decompression surgery and minimum low back pain.  FINDINGS: NERVE CONDUCTION STUDY: Bilateral peroneal sensory responses were normal. Bilateral tibial motor responses and right peroneal to EDB motor responses were normal.  Left peroneal to EDB motor response showed mildly decreased to C map amplitude, with normal distal latency, conduction velocity. Bilateral tibial H reflexes were normal and symmetric.  Left ulnar sensory and motor responses were normal. Left median sensory responses showed mildly prolonged peak latency was well preserved snap amplitude. Left median motor responses showed mildly prolonged distal latency, F wave latency, with normal C map amplitude and conduction velocity.    NEEDLE ELECTROMYOGRAPHY: Selective needle examinations were performed at bilateral lower extremity muscles bilateral lumbar sacral paraspinal muscles.   Bilateral tibialis anterior, vastus lateralis: Normal insertion activity, no spontaneous activity, mildly enlarged motor unit potential, with mildly decreased recruitment patterns.  Needle examination of bilateral tibialis posterior, medial gastrocnemius: normal insertion activity, no spontaneous activity, normal morphology motor unit potential, with normal recruitment patterns.  He has well-healed midline lumbar scar, increased insertional activity, complex motor unit potential, which is consistent with post surgical changes.  IMPRESSION:   This is a mild abnormal study, there is evidence of mild bilateral lumbar sacral radiculopathy, there is no evidence of active  process.  In addition, there is evidence of left median neuropathy across the wrist, consistent with moderate left carpal tunnel syndrome.  INTERPRETING PHYSICIAN:   Marcial Pacas M.D. Ph.D. [facility] [address], [place], [place] [address] [phone]

## 2015-07-15 ENCOUNTER — Other Ambulatory Visit: Payer: Self-pay | Admitting: Neurology

## 2015-07-15 ENCOUNTER — Ambulatory Visit (INDEPENDENT_AMBULATORY_CARE_PROVIDER_SITE_OTHER): Payer: Medicare Other | Admitting: Neurology

## 2015-07-15 ENCOUNTER — Ambulatory Visit
Admission: RE | Admit: 2015-07-15 | Discharge: 2015-07-15 | Disposition: A | Discharge status: Home / Self Care | Payer: Medicare Other | Source: Ambulatory Visit | Attending: Neurology | Admitting: Neurology

## 2015-07-15 ENCOUNTER — Encounter: Payer: Self-pay | Admitting: Neurology

## 2015-07-15 VITALS — BP 159/60 | HR 67 | Ht 69.0 in | Wt 180.2 lb

## 2015-07-15 DIAGNOSIS — G20 Parkinson's disease: Secondary | ICD-10-CM

## 2015-07-15 DIAGNOSIS — R269 Unspecified abnormalities of gait and mobility: Secondary | ICD-10-CM | POA: Diagnosis not present

## 2015-07-15 DIAGNOSIS — M48062 Spinal stenosis, lumbar region with neurogenic claudication: Secondary | ICD-10-CM

## 2015-07-15 DIAGNOSIS — M542 Cervicalgia: Secondary | ICD-10-CM

## 2015-07-15 LAB — CSF CELL COUNT WITH DIFFERENTIAL
RBC Count, CSF: 0 cells/uL (ref 0–10)
WBC, CSF: 4 cells/uL (ref 0–5)

## 2015-07-15 LAB — PROTEIN, CSF: TOTAL PROTEIN, CSF: 72 mg/dL — AB (ref 15–45)

## 2015-07-15 LAB — GRAM STAIN
GRAM STAIN: NONE SEEN
Gram Stain: NONE SEEN

## 2015-07-15 LAB — GLUCOSE, CSF: Glucose, CSF: 65 mg/dL (ref 43–76)

## 2015-07-15 MED ORDER — CARBIDOPA-LEVODOPA 25-100 MG PO TABS: 1.0000 | ORAL_TABLET | Freq: Three times a day (TID) | ORAL | Status: DC

## 2015-07-15 NOTE — Progress Notes (Signed)
Chief Complaint  Patient presents with  . Memory Loss    MMSE 27/30 - 8 animals.  He is here with his wife, Douglas Clark.    . Gait Problem    He is still having gait difficulty and is using a walker for assistance. He has had several falls. He has a pending LP today.      PATIENT: Douglas Clark DOB: [birth year]  Chief Complaint  Patient presents with  . Memory Loss    MMSE 27/30 - 8 animals.  He is here with his wife, Douglas Clark.    . Gait Problem    He is still having gait difficulty and is using a walker for assistance. He has had several falls. He has a pending LP today.     HISTORICAL  Douglas Clark is a 75 years old right-handed male, accompanied by his wife Douglas Clark, seen in refer by neurosurgeon Dr. Courtney Paris evaluation of memory loss, gait difficulty, his primary care physician is Dr. Doy Hutching.  He had a history of hypertension, hyperlipidemia, emphysema, [id number], he had sudden onset loss of consciousness, fell down 15 steps, he had transient retroamnesia then he also had a history of lumbar degenerative disc disease, had  lumbar decompression surgery from L2-L5 by neurosurgeon Dr. Joya Salm in November 2016, prior to the surgery, he has significant low back pain, gait difficulty, he attributed to his low back pain, he no longer has significant low back pain, only complains 2 out of 10 mild midline low back pain, but he was noted has worsening gait difficulty, he tends to bending forward, small shuffling gait, he also complains of urinary urgency, denies incontinence,  I personally reviewed his MRI of lumbar in October 2016 prior to his most recent lumbar decompression surgery,Advanced degenerative lumbar spondylosis with significant multilevel disc disease and facet disease. Moderate right foraminal stenosis at L2-3 due to disc osteophyte complex. There is also right lateral recess stenosis due to disc protrusion and spurring change. Moderate to moderately severe multifactorial spinal and bilateral  lateral recess stenosis at L3-4. Severe multifactorial spinal and bilateral lateral recess stenosis at L4-5. There is also mild to moderate bilateral foraminal stenosis. Moderate bilateral lateral recess and bilateral foraminal stenosis at L5-S1  He denies significant low back pain, no bilateral upper or lower extremity paresthesia, no significant weakness,  In addition, wife also reported that he has gradual onset mild memory trouble, he tends to repeat conversation, keep APPOINTMENT on the calendar to keep up, he had 14 years of education, was disabled since he fell in 1993,  He denies agnosia, no REM sleep disorder, he does get up 3 times using bathroom almost every night, he denies dysphagia, no dysarthria, no chewing difficulty,  UPDATE July 09 2015: Patient returned for electrodiagnostic study today, which showed evidence of mild bilateral lumbar sacral radiculopathy, there is no evidence of active process, the findings will certainly not explain his complains of progressive gait difficulty,  We have personally reviewed MRI of the brain without contrast in April 2017 with patient and his wife, there is evidence of mild to moderate atrophy, out of proportion ventriculomegaly, mild periventricular small vessel disease   MRI of cervical spine showed multilevel degenerative disc disease, variable degree of foraminal stenosis at different levels, but there is no evidence of significant canal stenosis or cord signal changes.  Both patient and his wife concurred, that his current gait difficulty continued to progressively getting worse, which is rather acute onset since 2016, in addition, he had  developed memory trouble since October 2016, urinary urgency occasionally incontinence, he no longer has significant low back pain, he has difficulty initiate gait,  UPDATE April 25th 2017:  MMSE - Mini Mental State Exam 07/15/2015 before lumbar punture 4/25, immediate after lumbar puncture  Orientation to  time 3 4  Orientation to Place 5 5  Registration 3 3  Attention/ Calculation 5 5  Recall 2 2  Language- name 2 objects 2 2  Language- repeat 1 1  Language- follow 3 step command 3 3  Language- read & follow direction 1 1  Write a sentence 1 1  Copy design 1 1  Total score 27 28     REVIEW OF SYSTEMS: Full 14 system review of systems performed and notable only for as above   ALLERGIES: Allergies  Allergen Reactions  . Doxazosin Hives  . Trazodone And Nefazodone Other (See Comments)    shivers  . Hctz [Hydrochlorothiazide] Rash    blisters    HOME MEDICATIONS: Current Outpatient Prescriptions  Medication Sig Dispense Refill  . acetaminophen (TYLENOL) 500 MG tablet Take 500 mg by mouth every 6 (six) hours as needed. pain    . aspirin 81 MG chewable tablet Chew 81 mg by mouth daily.    . carvedilol (COREG) 12.5 MG tablet Take 1 tablet (12.5 mg total) by mouth 2 (two) times daily with a meal. 60 tablet 10  . cholecalciferol (VITAMIN D) 1000 UNITS tablet Take 2,000 Units by mouth daily.    . clonazePAM (KLONOPIN) 2 MG tablet Take 2 mg by mouth at bedtime.    Marland Kitchen esomeprazole (NEXIUM) 20 MG capsule Take 20 mg by mouth daily.    Marland Kitchen HYDROcodone-acetaminophen (NORCO/VICODIN) 5-325 MG per tablet Take 1 tablet by mouth every 6 (six) hours as needed for moderate pain.    Marland Kitchen losartan (COZAAR) 50 MG tablet Take by mouth.    . Multiple Vitamin (MULTI-VITAMINS) TABS Take 1 tablet by mouth daily.    . nitroGLYCERIN (NITROSTAT) 0.4 MG SL tablet Place 1 tablet (0.4 mg total) under the tongue every 5 (five) minutes as needed for chest pain. 25 tablet 3  . simvastatin (ZOCOR) 20 MG tablet Take 20 mg by mouth daily.     No current facility-administered medications for this visit.    PAST MEDICAL HISTORY: Past Medical History  Diagnosis Date  . Myocardial infarction ([place])   . Hypertension   . Emphysema lung ([place])   . Anxiety   . History of kidney stones   . GERD (gastroesophageal reflux  disease)   . Concussion 1993    with long term memory loss  . Chronic fatigue and malaise     with STM loss  . HH (hiatus hernia)     PAST SURGICAL HISTORY: Past Surgical History  Procedure Laterality Date  . Coronary artery bypass graft      2000      . Kidney surgery      REMOVED STONE FROM RT KIDNEY  . Lithotripsy      X2   . Lumbar laminectomy/decompression microdiscectomy Bilateral 02/04/2015    Procedure: LUMBAR LAMINECTOMY/DECOMPRESSION MICRODISCECTOMY  Bilateral - Lumbar two-three lumbar three-four lumbar four-five lumbar five sacral one ;  Surgeon: Leeroy Cha, MD;  Location: [facility];  Service: Neurosurgery;  Laterality: Bilateral;    FAMILY HISTORY: Family History  Problem Relation Age of Onset  . Heart disease Father   . Varicose Veins Mother     SOCIAL HISTORY:  Social History  Social History  . Marital Status: Married    Spouse Name: N/A  . Number of Children: 3  . Years of Education: 14   Occupational History  . Retired    Social History Main Topics  . Smoking status: Current Every Day Smoker -- 1.00 packs/day    Types: Cigarettes  . Smokeless tobacco: Not on file  . Alcohol Use: 0.0 oz/week    0 Standard drinks or equivalent per week     Comment: OCC  . Drug Use: No  . Sexual Activity: Yes    Birth Control/ Protection: Post-menopausal   Other Topics Concern  . Not on file   Social History Narrative   Lives at home with his wife.   Right-handed.   1/2 cup of coffee per day.     PHYSICAL EXAM   Filed Vitals:   07/15/15 0857  BP: 159/60  Pulse: 67  Height: 5\' 9"  (1.753 m)  Weight: 180 lb 4 oz (81.761 kg)    Not recorded      Body mass index is 26.61 kg/(m^2).  PHYSICAL EXAMNIATION:  Gen: NAD, conversant, well nourised, obese, well groomed                     Cardiovascular: Regular rate rhythm, no peripheral edema, warm, nontender. Eyes: Conjunctivae clear without exudates or hemorrhage Neck: Supple, no carotid  bruise. Pulmonary: Clear to auscultation bilaterally   NEUROLOGICAL EXAM:  MENTAL STATUS: Speech:    Speech is normal; fluent and spontaneous with normal comprehension.  Cognition:Mini-Mental Status Examination 27/30, Before lumbar puncture, 28/30 after lumbar puncture     Orientation to time, place and person: He is not oriented to date, month,     Recent and remote memory: He missed 1/3 recalls     Normal Attention span and concentration     Normal Language, naming, repeating,spontaneous speech     Fund of knowledge   CRANIAL NERVES: CN II: Visual fields are full to confrontation. Fundoscopic exam is normal with sharp discs and no vascular changes. Pupils are round equal and briskly reactive to light. CN III, IV, VI: extraocular movement are normal. No ptosis. CN V: Facial sensation is intact to pinprick in all 3 divisions bilaterally. Corneal responses are intact.  CN VII: Face is symmetric with normal eye closure and smile. CN VIII: Hearing is normal to rubbing fingers CN IX, X: Palate elevates symmetrically. Phonation is normal. CN XI: Head turning and shoulder shrug are intact CN XII: Tongue is midline with normal movements and no atrophy.  MOTOR: He has mild to moderate bilateral upper and lower extremity rigidity, nuchal rigidity, mild bradykinesia,  no significant weakness  REFLEXES: Reflexes are 3 and symmetric at the biceps, triceps, knees, and trace ankles. Plantar responses are extensor  SENSORY : Length dependent decreased to light touch, pinprick, and vibratory sensation at toes COORDINATION: Rapid alternating movements and fine finger movements are intact. There is no dysmetria on finger-to-nose and heel-knee-shin.    GAIT/STANCE: He need to push up to get up from seated position, bending forward, stiff, wide based, difficulty initiate gait, [place] turning, tendency to speed up, leaning forward with prolonged walking  50 feet walking testing before lumbar  puncture was 51.28 second, after lumbar puncture was 51.17 seconds, there was no significant change in his walking before and after lumbar puncture.  DIAGNOSTIC DATA (LABS, IMAGING, TESTING) - I reviewed patient records, labs, notes, testing and imaging myself where available.   ASSESSMENT AND PLAN  Douglas Clark is a 75 y.o. male   Gait abnormality    his lumbar sacral radiculopathy and low back pain likely only play a minor role in his complains of gait difficulty,   His difficulty initiate gait, symmetric bradykinesia, rigidity, parkinsonian features,  on MRI of the brain ventriculomegaly out of proportion of mild to moderate cortical atrophy, given a impression of normal pressure hydrocephalus, he had large volume lumbar puncture today, I have evaluated his Mini-Mental status examination and gait before and left lumbar puncture, there was no significant change.  Parkinsonian symptoms  start Sinemet 25/103 times a day   Refer him to physical therapy  Return to clinic in 2 months  Face to face time was 40 minutes, greater than 50% of the time was spent in counseling and coordination of care with the patient  Marcial Pacas, M.D. Ph.D.  [facility] [address], [place], [place] [address] Ph: [phone] Fax: [phone]  CC: Leeroy Cha, MD,Jeffrey Lennice Sites, MD

## 2015-07-15 NOTE — Discharge Instructions (Signed)

## 2015-07-16 LAB — VDRL, CSF: VDRL Quant, CSF: NONREACTIVE

## 2015-08-18 LAB — FUNGUS CULTURE W SMEAR

## 2015-09-15 ENCOUNTER — Ambulatory Visit: Payer: Medicare Other | Admitting: Neurology

## 2015-11-13 ENCOUNTER — Other Ambulatory Visit: Payer: Self-pay | Admitting: Internal Medicine

## 2015-11-13 DIAGNOSIS — R634 Abnormal weight loss: Secondary | ICD-10-CM

## 2015-11-25 ENCOUNTER — Ambulatory Visit
Admission: RE | Admit: 2015-11-25 | Discharge: 2015-11-25 | Disposition: A | Discharge status: Home / Self Care | Payer: Medicare Other | Source: Ambulatory Visit | Attending: Internal Medicine | Admitting: Internal Medicine

## 2015-11-25 DIAGNOSIS — Z9889 Other specified postprocedural states: Secondary | ICD-10-CM | POA: Diagnosis not present

## 2015-11-25 DIAGNOSIS — J439 Emphysema, unspecified: Secondary | ICD-10-CM | POA: Diagnosis not present

## 2015-11-25 DIAGNOSIS — N3289 Other specified disorders of bladder: Secondary | ICD-10-CM | POA: Diagnosis not present

## 2015-11-25 DIAGNOSIS — N200 Calculus of kidney: Secondary | ICD-10-CM | POA: Insufficient documentation

## 2015-11-25 DIAGNOSIS — Z951 Presence of aortocoronary bypass graft: Secondary | ICD-10-CM | POA: Diagnosis not present

## 2015-11-25 DIAGNOSIS — R634 Abnormal weight loss: Secondary | ICD-10-CM

## 2015-11-25 DIAGNOSIS — R918 Other nonspecific abnormal finding of lung field: Secondary | ICD-10-CM | POA: Diagnosis not present

## 2015-11-25 DIAGNOSIS — N1330 Unspecified hydronephrosis: Secondary | ICD-10-CM | POA: Diagnosis not present

## 2015-11-25 LAB — POCT I-STAT CREATININE: Creatinine, Ser: 1 mg/dL (ref 0.61–1.24)

## 2015-11-25 MED ORDER — IOPAMIDOL (ISOVUE-300) INJECTION 61%
100.0000 mL | Freq: Once | INTRAVENOUS | Status: AC | PRN
  Administered 2015-11-25: 100 mL via INTRAVENOUS

## 2016-02-11 ENCOUNTER — Encounter: Payer: Self-pay | Admitting: *Deleted

## 2016-02-16 ENCOUNTER — Ambulatory Visit: Payer: Medicare Other | Admitting: Anesthesiology

## 2016-02-16 ENCOUNTER — Encounter
Admission: RE | Disposition: A | Discharge status: Home / Self Care | Payer: Self-pay | Source: Ambulatory Visit | Attending: Gastroenterology

## 2016-02-16 ENCOUNTER — Ambulatory Visit: Admit: 2016-02-16 | Payer: 59 | Admitting: Gastroenterology

## 2016-02-16 ENCOUNTER — Ambulatory Visit
Admission: RE | Admit: 2016-02-16 | Discharge: 2016-02-16 | Disposition: A | Discharge status: Home / Self Care | Payer: Medicare Other | Source: Ambulatory Visit | Attending: Gastroenterology | Admitting: Gastroenterology

## 2016-02-16 DIAGNOSIS — K224 Dyskinesia of esophagus: Secondary | ICD-10-CM | POA: Diagnosis not present

## 2016-02-16 DIAGNOSIS — R1312 Dysphagia, oropharyngeal phase: Secondary | ICD-10-CM | POA: Insufficient documentation

## 2016-02-16 DIAGNOSIS — I2510 Atherosclerotic heart disease of native coronary artery without angina pectoris: Secondary | ICD-10-CM | POA: Diagnosis not present

## 2016-02-16 DIAGNOSIS — F419 Anxiety disorder, unspecified: Secondary | ICD-10-CM | POA: Insufficient documentation

## 2016-02-16 DIAGNOSIS — K219 Gastro-esophageal reflux disease without esophagitis: Secondary | ICD-10-CM | POA: Insufficient documentation

## 2016-02-16 DIAGNOSIS — J449 Chronic obstructive pulmonary disease, unspecified: Secondary | ICD-10-CM | POA: Insufficient documentation

## 2016-02-16 DIAGNOSIS — K5730 Diverticulosis of large intestine without perforation or abscess without bleeding: Secondary | ICD-10-CM | POA: Insufficient documentation

## 2016-02-16 DIAGNOSIS — Z7982 Long term (current) use of aspirin: Secondary | ICD-10-CM | POA: Diagnosis not present

## 2016-02-16 DIAGNOSIS — K3189 Other diseases of stomach and duodenum: Secondary | ICD-10-CM | POA: Diagnosis not present

## 2016-02-16 DIAGNOSIS — D124 Benign neoplasm of descending colon: Secondary | ICD-10-CM | POA: Insufficient documentation

## 2016-02-16 DIAGNOSIS — K2980 Duodenitis without bleeding: Secondary | ICD-10-CM | POA: Diagnosis not present

## 2016-02-16 DIAGNOSIS — F17200 Nicotine dependence, unspecified, uncomplicated: Secondary | ICD-10-CM | POA: Diagnosis not present

## 2016-02-16 DIAGNOSIS — E785 Hyperlipidemia, unspecified: Secondary | ICD-10-CM | POA: Diagnosis not present

## 2016-02-16 DIAGNOSIS — K228 Other specified diseases of esophagus: Secondary | ICD-10-CM | POA: Insufficient documentation

## 2016-02-16 DIAGNOSIS — D122 Benign neoplasm of ascending colon: Secondary | ICD-10-CM | POA: Diagnosis not present

## 2016-02-16 DIAGNOSIS — I252 Old myocardial infarction: Secondary | ICD-10-CM | POA: Insufficient documentation

## 2016-02-16 DIAGNOSIS — R634 Abnormal weight loss: Secondary | ICD-10-CM | POA: Insufficient documentation

## 2016-02-16 DIAGNOSIS — G709 Myoneural disorder, unspecified: Secondary | ICD-10-CM | POA: Diagnosis not present

## 2016-02-16 DIAGNOSIS — K2960 Other gastritis without bleeding: Secondary | ICD-10-CM | POA: Diagnosis not present

## 2016-02-16 DIAGNOSIS — K640 First degree hemorrhoids: Secondary | ICD-10-CM | POA: Insufficient documentation

## 2016-02-16 DIAGNOSIS — Z79899 Other long term (current) drug therapy: Secondary | ICD-10-CM | POA: Diagnosis not present

## 2016-02-16 DIAGNOSIS — Z951 Presence of aortocoronary bypass graft: Secondary | ICD-10-CM | POA: Insufficient documentation

## 2016-02-16 DIAGNOSIS — R6881 Early satiety: Secondary | ICD-10-CM | POA: Insufficient documentation

## 2016-02-16 DIAGNOSIS — I10 Essential (primary) hypertension: Secondary | ICD-10-CM | POA: Insufficient documentation

## 2016-02-16 HISTORY — DX: Hyperlipidemia, unspecified: E78.5

## 2016-02-16 HISTORY — DX: Atherosclerotic heart disease of native coronary artery without angina pectoris: I25.10

## 2016-02-16 HISTORY — PX: COLONOSCOPY WITH PROPOFOL: SHX5780

## 2016-02-16 HISTORY — PX: ESOPHAGOGASTRODUODENOSCOPY (EGD) WITH PROPOFOL: SHX5813

## 2016-02-16 HISTORY — DX: Nonspecific intraventricular block: I45.4

## 2016-02-16 SURGERY — COLONOSCOPY WITH PROPOFOL
Anesthesia: General

## 2016-02-16 MED ORDER — PROPOFOL 500 MG/50ML IV EMUL
INTRAVENOUS | Status: DC | PRN
  Administered 2016-02-16: 140 ug/kg/min via INTRAVENOUS

## 2016-02-16 MED ORDER — SODIUM CHLORIDE 0.9 % IV SOLN
INTRAVENOUS | Status: DC
  Administered 2016-02-16: 1000 mL via INTRAVENOUS

## 2016-02-16 MED ORDER — SODIUM CHLORIDE 0.9 % IV SOLN
INTRAVENOUS | Status: DC
  Administered 2016-02-16 (×2): via INTRAVENOUS

## 2016-02-16 MED ORDER — LIDOCAINE HCL (PF) 1 % IJ SOLN
INTRAMUSCULAR | Status: AC
  Administered 2016-02-16: 0.3 mL via INTRADERMAL
  Filled 2016-02-16: qty 2

## 2016-02-16 MED ORDER — FENTANYL CITRATE (PF) 100 MCG/2ML IJ SOLN
INTRAMUSCULAR | Status: DC | PRN
  Administered 2016-02-16: 50 ug via INTRAVENOUS

## 2016-02-16 MED ORDER — MIDAZOLAM HCL 2 MG/2ML IJ SOLN
INTRAMUSCULAR | Status: DC | PRN
  Administered 2016-02-16: .5 mg via INTRAVENOUS

## 2016-02-16 MED ORDER — PROPOFOL 10 MG/ML IV BOLUS
INTRAVENOUS | Status: DC | PRN
  Administered 2016-02-16: 50 mg via INTRAVENOUS

## 2016-02-16 MED ORDER — LIDOCAINE HCL (CARDIAC) 20 MG/ML IV SOLN
INTRAVENOUS | Status: DC | PRN
  Administered 2016-02-16: 100 mg via INTRATRACHEAL

## 2016-02-16 MED ORDER — PHENYLEPHRINE HCL 10 MG/ML IJ SOLN
INTRAMUSCULAR | Status: DC | PRN
Start: 2016-02-16 — End: 2016-02-16
  Administered 2016-02-16 (×3): 100 ug via INTRAVENOUS
  Administered 2016-02-16: 150 ug via INTRAVENOUS
  Administered 2016-02-16 (×2): 50 ug via INTRAVENOUS
  Administered 2016-02-16 (×3): 100 ug via INTRAVENOUS
  Administered 2016-02-16: 50 ug via INTRAVENOUS
  Administered 2016-02-16: 200 ug via INTRAVENOUS

## 2016-02-16 MED ORDER — LIDOCAINE HCL (PF) 1 % IJ SOLN
2.0000 mL | Freq: Once | INTRAMUSCULAR | Status: AC
  Administered 2016-02-16: 0.3 mL via INTRADERMAL

## 2016-02-16 NOTE — Anesthesia Procedure Notes (Signed)
Date/Time: 02/16/2016 1:15 PM Performed by: Allean Found Pre-anesthesia Checklist: Patient identified, Emergency Drugs available, Suction available, Patient being monitored and Timeout performed Patient Re-evaluated:Patient Re-evaluated prior to inductionOxygen Delivery Method: Nasal cannula Intubation Type: IV induction

## 2016-02-16 NOTE — Op Note (Signed)
[facility] Gastroenterology Patient Name: Douglas Clark Procedure Date: 02/16/2016 12:45 PM MRN: [id number] Account #: [id number] Date of Birth: Jul 10, 1940 Admit Type: Outpatient Age: 75 Room: [facility] ENDO ROOM 3 Gender: Male Note Status: Finalized Procedure:            Colonoscopy Indications:          Weight loss Providers:            Lollie Sails, MD Medicines:            Monitored Anesthesia Care Complications:        No immediate complications. Procedure:            Pre-Anesthesia Assessment:                       - ASA Grade Assessment: III - A patient with severe                        systemic disease.                       After obtaining informed consent, the colonoscope was                        passed under direct vision. Throughout the procedure,                        the patient's blood pressure, pulse, and oxygen                        saturations were monitored continuously. The                        Colonoscope was introduced through the anus and                        advanced to the the cecum, identified by appendiceal                        orifice and ileocecal valve. The colonoscopy was                        performed without difficulty. The patient tolerated the                        procedure well. The quality of the bowel preparation                        was fair. Findings:      Multiple small and large-mouthed diverticula were found in the sigmoid       colon, descending colon, transverse colon and ascending colon.      Non-bleeding internal hemorrhoids were found during retroflexion and       during anoscopy. The hemorrhoids were small and Grade I (internal       hemorrhoids that do not prolapse).      The digital rectal exam was normal.      Two flat polyps were found in the mid ascending colon. The polyps were 3       to 5 mm in size. These polyps were removed with a cold snare. Resection       and retrieval were  complete.      A 5 mm polyp was found in the proximal ascending colon. The polyp was       flat. The polyp was removed with a cold snare. Resection and retrieval       were complete.      A 7 mm polyp was found in the descending colon. The polyp was flat. The       polyp was removed with a cold snare. Resection and retrieval were       complete.      The retroflexed view of the distal rectum and anal verge was normal and       showed no anal or rectal abnormalities.      The digital rectal exam was normal. Impression:           - Preparation of the colon was fair.                       - Diverticulosis in the sigmoid colon, in the                        descending colon, in the transverse colon and in the                        ascending colon.                       - Non-bleeding internal hemorrhoids.                       - No specimens collected. Recommendation:       - Discharge patient to home.                       - Telephone GI clinic for pathology results in 1 week. Procedure Code(s):    --- Professional ---                       [phone], Colonoscopy, flexible; with removal of tumor(s),                        polyp(s), or other lesion(s) by snare technique Diagnosis Code(s):    --- Professional ---                       K64.0, First degree hemorrhoids                       R63.4, Abnormal weight loss                       K57.30, Diverticulosis of large intestine without                        perforation or abscess without bleeding CPT copyright 2016 American Medical Association. All rights reserved. The codes documented in this report are preliminary and upon coder review may  be revised to meet current compliance requirements. Lollie Sails, MD 02/16/2016 1:56:41 PM This report has been signed electronically. Number of Addenda: 0 Note Initiated On: 02/16/2016 12:45 PM Scope Withdrawal Time: 0 hours 12 minutes 31 seconds  Total Procedure Duration: 0 hours 28 minutes 28  seconds       [facility]

## 2016-02-16 NOTE — Anesthesia Postprocedure Evaluation (Signed)
Anesthesia Post Note  Patient: Douglas Clark [facility]  Procedure(s) Performed: Procedure(s) (LRB): COLONOSCOPY WITH PROPOFOL (N/A) ESOPHAGOGASTRODUODENOSCOPY (EGD) WITH PROPOFOL (N/A)  Patient location during evaluation: Endoscopy Anesthesia Type: General Level of consciousness: awake and alert Pain management: pain level controlled Vital Signs Assessment: post-procedure vital signs reviewed and stable Respiratory status: spontaneous breathing and respiratory function stable Cardiovascular status: stable Anesthetic complications: no    Last Vitals:  Vitals:   02/16/16 1407 02/16/16 1417  BP: 106/70 103/63  Pulse: (!) 51 66  Resp: 18 20  Temp: (!) 35.9 C     Last Pain:  Vitals:   02/16/16 1407  TempSrc: Tympanic                 Maybelline Kolarik K

## 2016-02-16 NOTE — Anesthesia Preprocedure Evaluation (Signed)
Anesthesia Evaluation  Patient identified by MRN, date of birth, ID band Patient awake    Reviewed: Allergy & Precautions, NPO status , Patient's Chart, lab work & pertinent test results  History of Anesthesia Complications Negative for: history of anesthetic complications  Airway Mallampati: II       Dental   Pulmonary COPD,  COPD inhaler, Current Smoker,           Cardiovascular hypertension, Pt. on medications and Pt. on home beta blockers + CAD, + Past MI and + CABG  + dysrhythmias (BBB)      Neuro/Psych Anxiety  Neuromuscular disease (balance problems taking carbadopa)    GI/Hepatic Neg liver ROS, hiatal hernia, GERD  Medicated,  Endo/Other  negative endocrine ROS  Renal/GU negative Renal ROS     Musculoskeletal negative musculoskeletal ROS (+)   Abdominal   Peds  Hematology negative hematology ROS (+)   Anesthesia Other Findings   Reproductive/Obstetrics                            Anesthesia Physical Anesthesia Plan  ASA: II  Anesthesia Plan: General   Post-op Pain Management:    Induction:   Airway Management Planned: Nasal Cannula  Additional Equipment:   Intra-op Plan:   Post-operative Plan:   Informed Consent: I have reviewed the patients History and Physical, chart, labs and discussed the procedure including the risks, benefits and alternatives for the proposed anesthesia with the patient or authorized representative who has indicated his/her understanding and acceptance.     Plan Discussed with:   Anesthesia Plan Comments:         Anesthesia Quick Evaluation

## 2016-02-16 NOTE — Transfer of Care (Signed)
Immediate Anesthesia Transfer of Care Note  Patient: Douglas Clark [facility]  Procedure(s) Performed: Procedure(s): COLONOSCOPY WITH PROPOFOL (N/A) ESOPHAGOGASTRODUODENOSCOPY (EGD) WITH PROPOFOL (N/A)  Patient Location: PACU  Anesthesia Type:General  Level of Consciousness: sedated  Airway & Oxygen Therapy: Patient Spontanous Breathing and Patient connected to nasal cannula oxygen  Post-op Assessment: Report given to RN and Post -op Vital signs reviewed and stable  Post vital signs: Reviewed and stable  Last Vitals:  Vitals:   02/16/16 1209 02/16/16 1407  BP: 124/77 106/70  Pulse: 66 (!) 51  Resp: 16 18  Temp: 36.3 C (!) 35.9 C    Last Pain:  Vitals:   02/16/16 1407  TempSrc: Tympanic         Complications: No apparent anesthesia complications

## 2016-02-16 NOTE — H&P (Signed)
Outpatient short stay form Pre-procedure 02/16/2016 12:36 PM Lollie Sails MD  Primary Physician: Dr. Fulton Reek  Reason for visit:  EGD and colonoscopy  History of present illness:  Patient is a 75 year old male presenting today with complaint of weight loss. There is also been some amount of oropharyngeal dysphagia both liquids and solids. There is also been some early satiety.  No other GI symptoms. His last colonoscopy was over 10 years ago. He tolerated his prep well. He takes no aspirin or blood thinning products. He does take a 81 mg aspirin however but has held that for a couple days.    Current Facility-Administered Medications:  .  0.9 %  sodium chloride infusion, , Intravenous, Continuous, Lollie Sails, MD .  0.9 %  sodium chloride infusion, , Intravenous, Continuous, Lollie Sails, MD .  lidocaine (PF) (XYLOCAINE) 1 % injection 2 mL, 2 mL, Intradermal, Once, Lollie Sails, MD .  lidocaine (PF) (XYLOCAINE) 1 % injection, , , ,   Prescriptions Prior to Admission  Medication Sig Dispense Refill Last Dose  . mirtazapine (REMERON) 30 MG tablet Take 30 mg by mouth at bedtime.     Marland Kitchen acetaminophen (TYLENOL) 500 MG tablet Take 500 mg by mouth every 6 (six) hours as needed. pain   Taking  . aspirin 81 MG chewable tablet Chew 81 mg by mouth daily.   02/11/2016  . carbidopa-levodopa (SINEMET IR) 25-100 MG tablet Take 1 tablet by mouth 3 (three) times daily. 90 tablet 6   . carvedilol (COREG) 12.5 MG tablet Take 1 tablet (12.5 mg total) by mouth 2 (two) times daily with a meal. 60 tablet 10 Taking  . cholecalciferol (VITAMIN D) 1000 UNITS tablet Take 2,000 Units by mouth daily.   Taking  . clonazePAM (KLONOPIN) 2 MG tablet Take 2 mg by mouth at bedtime.   Taking  . esomeprazole (NEXIUM) 20 MG capsule Take 20 mg by mouth daily.   Taking  . HYDROcodone-acetaminophen (NORCO/VICODIN) 5-325 MG per tablet Take 1 tablet by mouth every 6 (six) hours as needed for moderate  pain.   Taking  . losartan (COZAAR) 50 MG tablet Take by mouth.   Taking  . Multiple Vitamin (MULTI-VITAMINS) TABS Take 1 tablet by mouth daily.   Taking  . nitroGLYCERIN (NITROSTAT) 0.4 MG SL tablet Place 1 tablet (0.4 mg total) under the tongue every 5 (five) minutes as needed for chest pain. 25 tablet 3 Taking  . simvastatin (ZOCOR) 20 MG tablet Take 20 mg by mouth daily.   Taking     Allergies  Allergen Reactions  . Doxazosin Hives  . Trazodone And Nefazodone Other (See Comments)    shivers  . Hctz [Hydrochlorothiazide] Rash    blisters     Past Medical History:  Diagnosis Date  . Anxiety   . BBB (bundle branch block)    RIGHT AND LEFT  . Chronic fatigue and malaise    with STM loss  . Concussion 1993   with long term memory loss  . Coronary artery disease   . Emphysema lung ([place])   . GERD (gastroesophageal reflux disease)   . HH (hiatus hernia)   . History of kidney stones   . Hyperlipidemia   . Hypertension   . Myocardial infarction     Review of systems:      Physical Exam    Heart and lungs: Regular rate and rhythm without rub or gallop.    HEENT: Normocephalic atraumatic eyes are anicteric  Other:     Pertinant exam for procedure: Soft nontender nondistended bowel sounds positive normoactive.    Planned proceedures: EGD, colonoscopy and indicated procedures. I have discussed the risks benefits and complications of procedures to include not limited to bleeding, infection, perforation and the risk of sedation and the patient wishes to proceed.  Lollie Sails, MD Gastroenterology 02/16/2016  12:36 PM

## 2016-02-16 NOTE — Op Note (Signed)
[facility] Gastroenterology Patient Name: Douglas Clark Procedure Date: 02/16/2016 12:44 PM MRN: [id number] Account #: [id number] Date of Birth: 06-Apr-1940 Admit Type: Outpatient Age: 75 Room: [facility] ENDO ROOM 3 Gender: Male Note Status: Finalized Procedure:            Upper GI endoscopy Indications:          Weight loss Providers:            Lollie Sails, MD Referring MD:         Leonie Douglas. Doy Hutching, MD (Referring MD) Medicines:            Monitored Anesthesia Care Complications:        No immediate complications. Procedure:            Pre-Anesthesia Assessment:                       - ASA Grade Assessment: III - A patient with severe                        systemic disease.                       After obtaining informed consent, the endoscope was                        passed under direct vision. Throughout the procedure,                        the patient's blood pressure, pulse, and oxygen                        saturations were monitored continuously. The Endoscope                        was introduced through the mouth, and advanced to the                        third part of duodenum. The patient tolerated the                        procedure well. Findings:      Abnormal motility was noted in the middle third of the esophagus and in       the lower third of the esophagus. The cricopharyngeus was normal. There       is spasticity of the esophageal body. The distal esophagus/lower       esophageal sphincter is open. Tertiary peristaltic waves are noted.      The Z-line was irregular. Biopsies were taken with a cold forceps for       histology.      Diffuse and patchy mild inflammation characterized by adherent blood,       congestion (edema), erythema and granularity was found in the entire       examined stomach. Biopsies were taken with a cold forceps for histology.       Biopsies were taken with a cold forceps for Helicobacter pylori testing.  Patchy moderate inflammation characterized by erosions, erythema and       shallow ulcerations was found in the prepyloric region of the stomach.      Patchy mild inflammation characterized by erythema and granularity was  found in the duodenal bulb and in the second portion of the duodenum.      The cardia and gastric fundus were normal on retroflexion otherwise. Impression:           - Abnormal esophageal motility, suspicious for                        presbyesophagus.                       - Z-line irregular. Biopsied.                       - Erosive gastritis. Biopsied.                       - Erosive gastritis.                       - Erosive duodenitis. Recommendation:       - Discharge patient to home.                       - Await pathology results.                       - Return to GI clinic in 3 weeks.                       - Use Protonix (pantoprazole) 40 mg PO BID. Procedure Code(s):    --- Professional ---                       [phone], Esophagogastroduodenoscopy, flexible, transoral;                        with biopsy, single or multiple Diagnosis Code(s):    --- Professional ---                       K22.4, Dyskinesia of esophagus                       K22.8, Other specified diseases of esophagus                       K29.60, Other gastritis without bleeding                       K29.80, Duodenitis without bleeding                       R63.4, Abnormal weight loss CPT copyright 2016 American Medical Association. All rights reserved. The codes documented in this report are preliminary and upon coder review may  be revised to meet current compliance requirements. Lollie Sails, MD 02/16/2016 1:21:37 PM This report has been signed electronically. Number of Addenda: 0 Note Initiated On: 02/16/2016 12:44 PM      [facility]

## 2016-02-16 NOTE — OR Nursing (Signed)
Pt to f/u in dr Donnella Sham office 03/23/16 at 2pm , wife also instructed to retrived protonix at pharmacy and adm bid 51minutes prior to eating. Understanding voiced. Teaching to wife d/t pt hx of dementia

## 2016-02-17 ENCOUNTER — Encounter: Payer: Self-pay | Admitting: Gastroenterology

## 2016-02-18 LAB — SURGICAL PATHOLOGY

## 2016-07-21 IMAGING — CT CT NECK W/ CM
4 of 5 series · 14 of 33 positions shown, 16 images · IV contrast (omnipaque)
Comparison: Correlation with [place] CT neck report dated
02/08/2014. Report but not images were available via [REDACTED] [REDACTED].

CLINICAL DATA: Follow-up left neck lesion on outside hospital CT

EXAM:
CT NECK WITH CONTRAST
TECHNIQUE: Multidetector CT imaging of the neck was performed using the
standard protocol following the bolus administration of intravenous
contrast.
CONTRAST:  75mL OMNIPAQUE IOHEXOL 300 MG/ML  SOLN

[Series 2: axial · axial · 0.62mm/px · z∈[-263,-209]mm · 2 of 135 slices shown]
[im 27/135  bone]
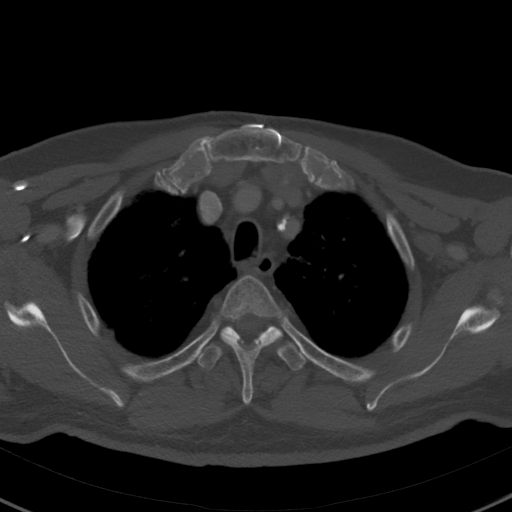
[im 54/135  bone]
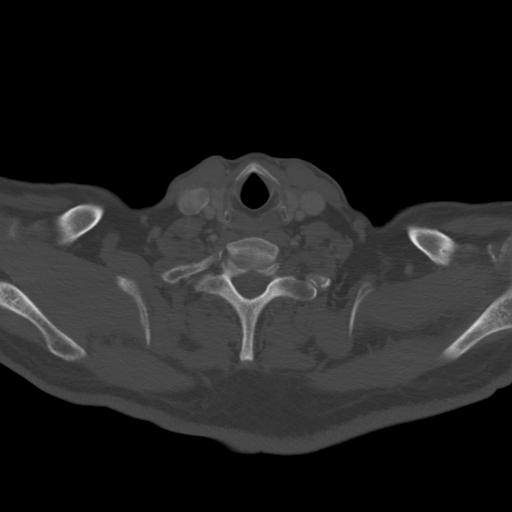

[Series 4: ax oropharynx · axial · 0.49mm/px · z∈[-302,-119]mm · 4 of 159 slices shown, 5 images]
[im 32/159  soft-tissue]
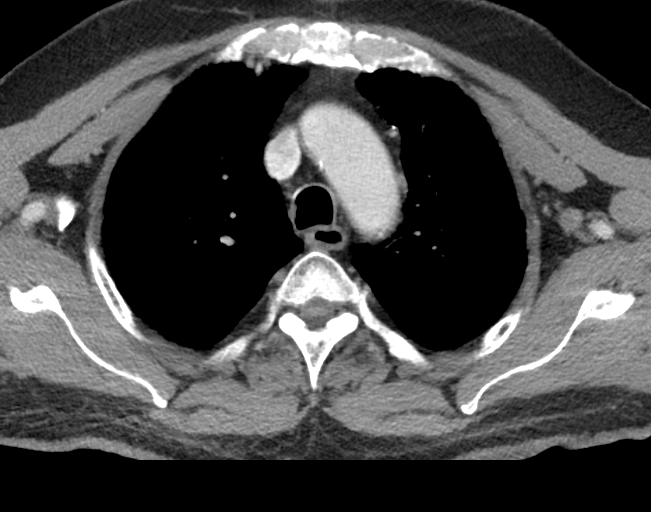
[im 32/159  bone]
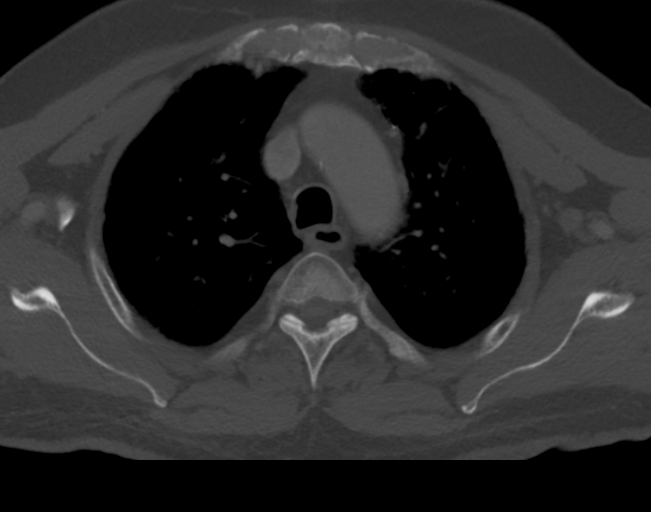
[im 64/159  bone]
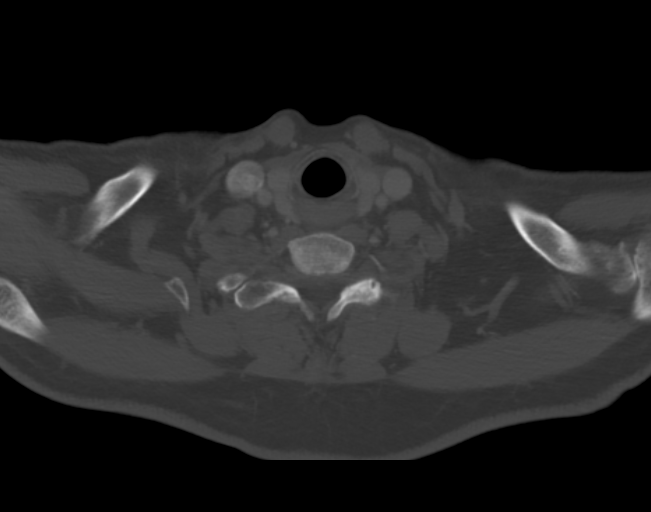
[im 95/159  bone]
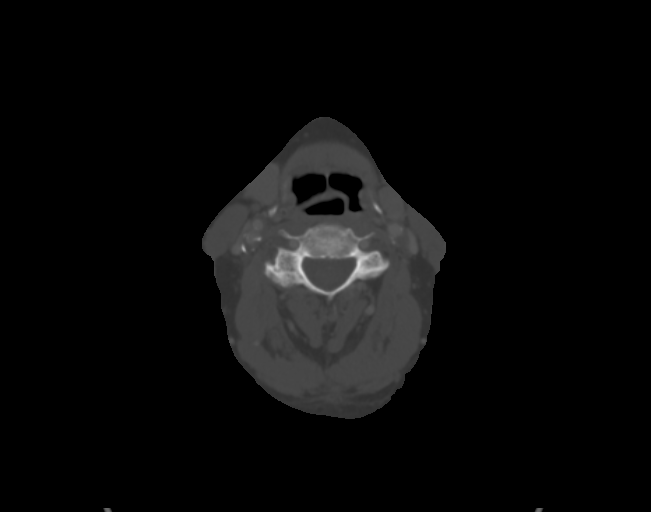
[im 127/159  bone]
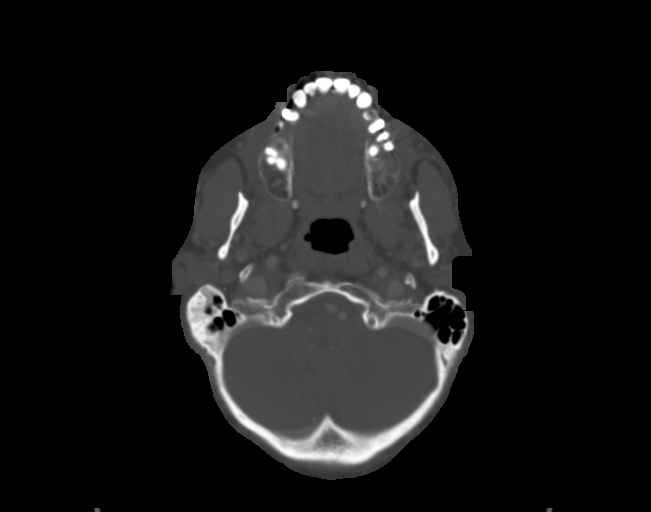

[Series 5: cor neck · coronal · 0.61mm/px · 3 of 130 slices shown]
[im 41/130  bone]
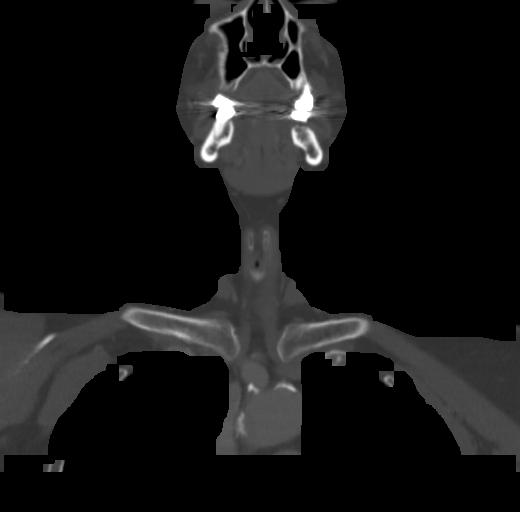
[im 57/130  bone]
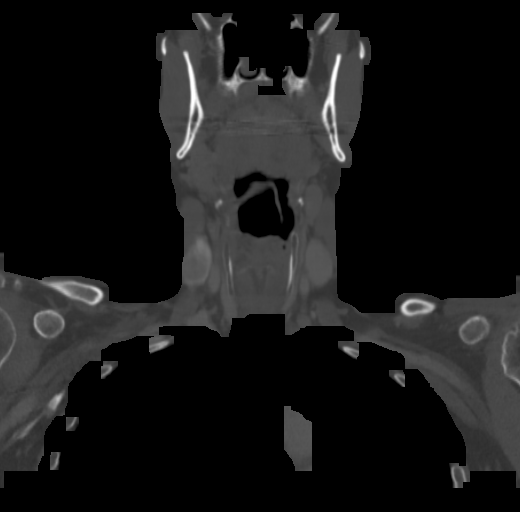
[im 73/130  bone]
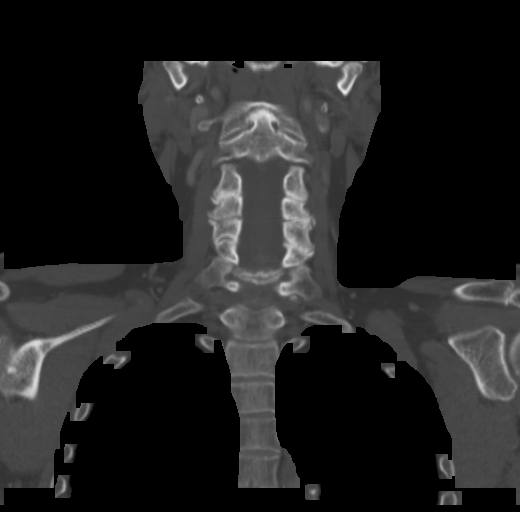

[Series 6: sag neck · sagittal · 0.50mm/px · 5 of 160 slices shown, 6 images]
[im 54/160  bone]
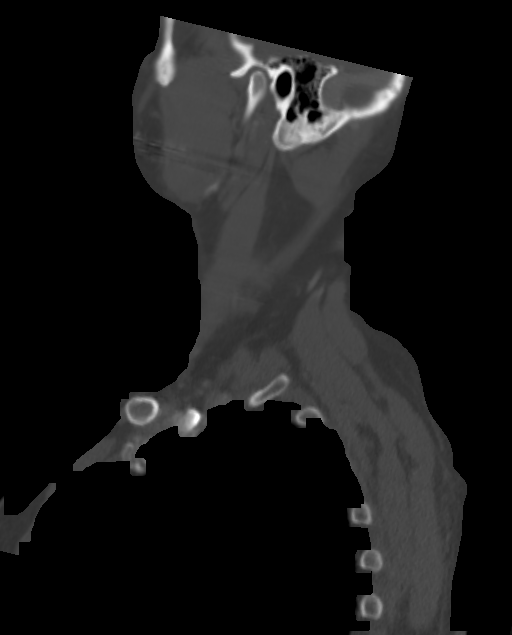
[im 67/160  bone]
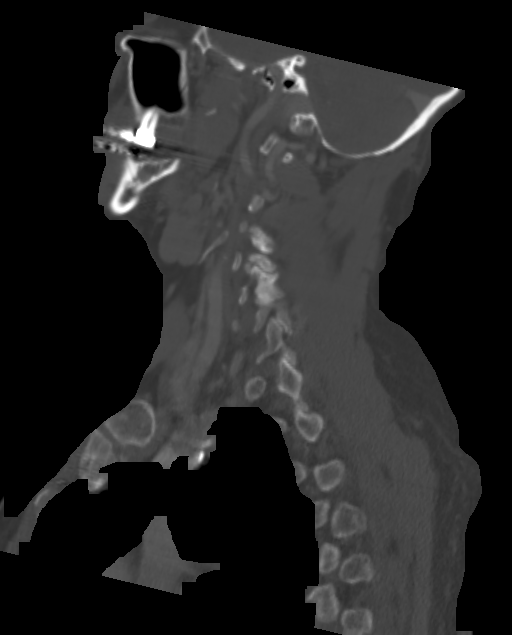
[im 80/160  soft-tissue]
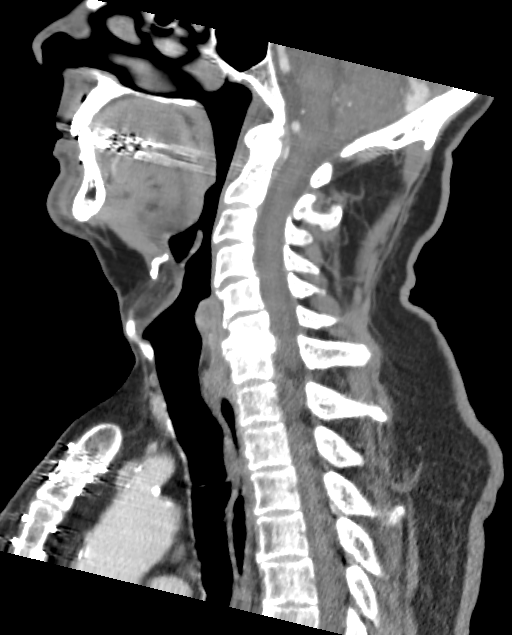
[im 80/160  bone]
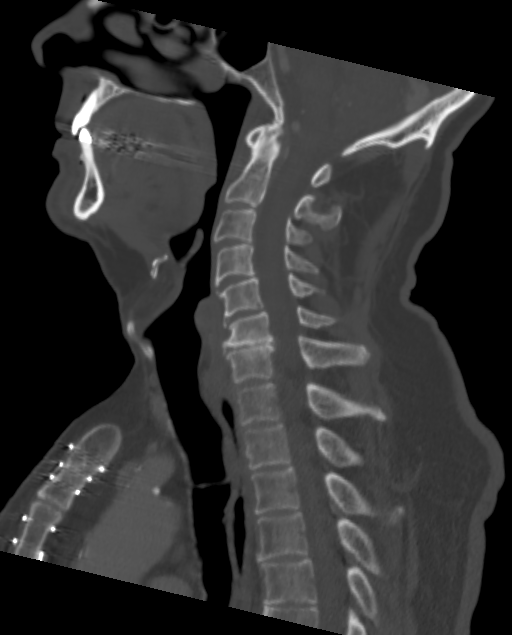
[im 93/160  bone]
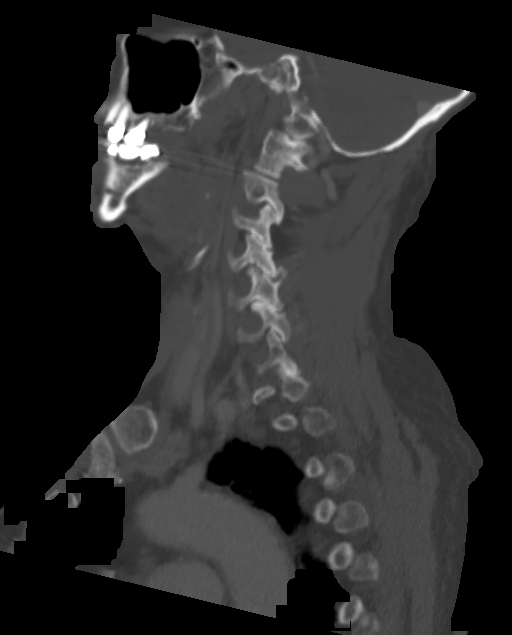
[im 107/160  bone]
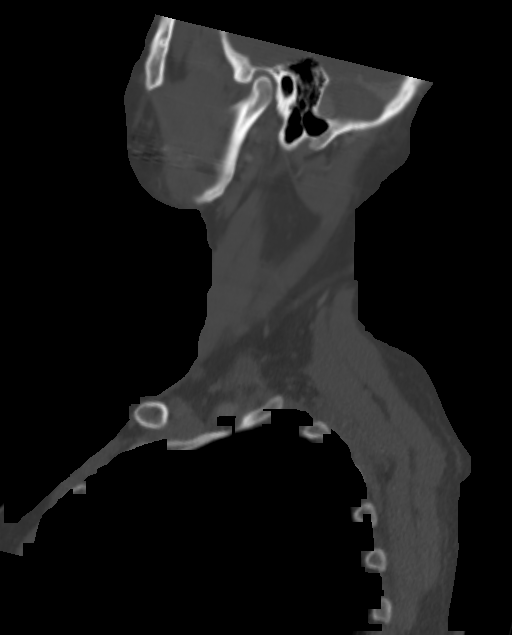

[14 of 33 positions shown; findings below may reference images not displayed]

FINDINGS: Pharynx and larynx: Within normal limits.

Salivary glands: Left submandibular gland is surgically absent.
Otherwise within normal limits.

Thyroid: Within normal limits.

Lymph nodes: 1.1 x 1.7 cm left level IIa/IIIa node (series 2/image
55), mildly enlarged, previously 1.3 x 1.8 cm by report. Otherwise,
no suspicious cervical lymphadenopathy.

Vascular: Within normal limits.

Limited intracranial: Within normal limits.

Visualized orbits: Visualized globes and retroconal soft tissues are
within normal limits.

Mastoids and visualized paranasal sinuses: Visualized paranasal
sinuses and mastoid air cells are clear.

Skeleton: No focal osseous lesions.

Upper chest: Visualized lung apices are notable for moderate
centrilobular and paraseptal emphysematous changes.
IMPRESSION: Prior left submandibular gland resection.

Mildly prominent left level IIa/IIIa node, measuring 1.1 x 1.7 cm,
previously 1.3 x 1.8 cm by report. Given stability/decrease in size,
a reactive/postinflammatory etiology is favored.

## 2016-08-23 ENCOUNTER — Other Ambulatory Visit: Payer: Self-pay | Admitting: Physical Medicine and Rehabilitation

## 2016-08-23 DIAGNOSIS — M5416 Radiculopathy, lumbar region: Secondary | ICD-10-CM

## 2016-08-31 ENCOUNTER — Ambulatory Visit
Admission: RE | Admit: 2016-08-31 | Discharge: 2016-08-31 | Disposition: A | Discharge status: Home / Self Care | Payer: Medicare Other | Source: Ambulatory Visit | Attending: Physical Medicine and Rehabilitation | Admitting: Physical Medicine and Rehabilitation

## 2016-08-31 DIAGNOSIS — M5116 Intervertebral disc disorders with radiculopathy, lumbar region: Secondary | ICD-10-CM | POA: Insufficient documentation

## 2016-08-31 DIAGNOSIS — M5416 Radiculopathy, lumbar region: Secondary | ICD-10-CM

## 2016-08-31 DIAGNOSIS — M5136 Other intervertebral disc degeneration, lumbar region: Secondary | ICD-10-CM | POA: Diagnosis not present

## 2016-08-31 DIAGNOSIS — M48061 Spinal stenosis, lumbar region without neurogenic claudication: Secondary | ICD-10-CM | POA: Insufficient documentation

## 2016-08-31 MED ORDER — GADOBENATE DIMEGLUMINE 529 MG/ML IV SOLN
15.0000 mL | Freq: Once | INTRAVENOUS | Status: AC | PRN
  Administered 2016-08-31: 15 mL via INTRAVENOUS

## 2016-09-01 ENCOUNTER — Other Ambulatory Visit: Payer: Self-pay | Admitting: Internal Medicine

## 2016-09-01 DIAGNOSIS — R634 Abnormal weight loss: Secondary | ICD-10-CM

## 2016-09-08 ENCOUNTER — Ambulatory Visit
Admission: RE | Admit: 2016-09-08 | Discharge: 2016-09-08 | Disposition: A | Discharge status: Home / Self Care | Payer: Medicare Other | Source: Ambulatory Visit | Attending: Internal Medicine | Admitting: Internal Medicine

## 2016-09-08 DIAGNOSIS — I714 Abdominal aortic aneurysm, without rupture: Secondary | ICD-10-CM | POA: Insufficient documentation

## 2016-09-08 DIAGNOSIS — N289 Disorder of kidney and ureter, unspecified: Secondary | ICD-10-CM | POA: Diagnosis not present

## 2016-09-08 DIAGNOSIS — R634 Abnormal weight loss: Secondary | ICD-10-CM

## 2016-09-08 DIAGNOSIS — I700 Atherosclerosis of aorta: Secondary | ICD-10-CM | POA: Insufficient documentation

## 2016-09-08 DIAGNOSIS — N400 Enlarged prostate without lower urinary tract symptoms: Secondary | ICD-10-CM | POA: Insufficient documentation

## 2016-09-08 DIAGNOSIS — K5730 Diverticulosis of large intestine without perforation or abscess without bleeding: Secondary | ICD-10-CM | POA: Diagnosis not present

## 2016-09-08 MED ORDER — IOPAMIDOL (ISOVUE-300) INJECTION 61%
100.0000 mL | Freq: Once | INTRAVENOUS | Status: AC | PRN
  Administered 2016-09-08: 100 mL via INTRAVENOUS

## 2016-09-09 ENCOUNTER — Other Ambulatory Visit: Payer: Self-pay | Admitting: Internal Medicine

## 2016-09-09 DIAGNOSIS — N2889 Other specified disorders of kidney and ureter: Secondary | ICD-10-CM

## 2016-09-15 ENCOUNTER — Ambulatory Visit
Admission: RE | Admit: 2016-09-15 | Discharge: 2016-09-15 | Disposition: A | Discharge status: Home / Self Care | Payer: Medicare Other | Source: Ambulatory Visit | Attending: Internal Medicine | Admitting: Internal Medicine

## 2016-09-15 DIAGNOSIS — I700 Atherosclerosis of aorta: Secondary | ICD-10-CM | POA: Insufficient documentation

## 2016-09-15 DIAGNOSIS — N2889 Other specified disorders of kidney and ureter: Secondary | ICD-10-CM | POA: Diagnosis not present

## 2016-09-15 DIAGNOSIS — N281 Cyst of kidney, acquired: Secondary | ICD-10-CM | POA: Insufficient documentation

## 2016-09-15 MED ORDER — GADOBENATE DIMEGLUMINE 529 MG/ML IV SOLN
15.0000 mL | Freq: Once | INTRAVENOUS | Status: AC | PRN
  Administered 2016-09-15: 15 mL via INTRAVENOUS

## 2016-12-07 ENCOUNTER — Ambulatory Visit (INDEPENDENT_AMBULATORY_CARE_PROVIDER_SITE_OTHER): Payer: Medicare Other | Admitting: Urology

## 2016-12-07 DIAGNOSIS — N138 Other obstructive and reflux uropathy: Secondary | ICD-10-CM

## 2016-12-07 DIAGNOSIS — R6889 Other general symptoms and signs: Secondary | ICD-10-CM | POA: Diagnosis not present

## 2016-12-07 DIAGNOSIS — R3129 Other microscopic hematuria: Secondary | ICD-10-CM | POA: Diagnosis not present

## 2016-12-07 DIAGNOSIS — N521 Erectile dysfunction due to diseases classified elsewhere: Secondary | ICD-10-CM | POA: Diagnosis not present

## 2016-12-07 DIAGNOSIS — R972 Elevated prostate specific antigen [PSA]: Secondary | ICD-10-CM | POA: Diagnosis not present

## 2016-12-07 DIAGNOSIS — N200 Calculus of kidney: Secondary | ICD-10-CM

## 2016-12-07 DIAGNOSIS — N281 Cyst of kidney, acquired: Secondary | ICD-10-CM | POA: Diagnosis not present

## 2016-12-07 DIAGNOSIS — N401 Enlarged prostate with lower urinary tract symptoms: Secondary | ICD-10-CM | POA: Diagnosis not present

## 2016-12-07 NOTE — Progress Notes (Signed)
12/07/2016 9:37 PM   Lynne Leader 1940-06-26 [id number]  Referring provider: Idelle Crouch, MD [facility] [place], [place] [address]  Chief Complaint  Patient presents with  . Elevated PSA    New patient    HPI: 76 year old male referred by Dr. Doy Hutching for further evaluation of elevated PSA. On further discussion today and on chart review, he does have multiple GU issues which are addressed as below.  Elevated PSA Elevated and rising PSA (trend below).  Nodular 1.7 cm focus of hyperenhancement the peripheral zone of the mid prostate seen on CT scan with contrast on 09/08/2016.  He has had a significant related and 25 pound weight loss in the recent past.  No previous biopsy.  3.73 on 10/2014 3.21 on 09/2015 4.6 on 08/2016 5.09 on 10/2016  BPH with urinary frequency and weak stream Chronic weak stream, difficulty starting stream.  Urinary frequency with nocturia x 2-3.  He also has urinary urgency with urgency with rare urge incontience and wears depends when he goes out for protection.    Underwent "heat shrinking" prostate procedure in 2005 which was ineffective.  Chronically on flomax.    Microscopic hematuria Review of records reveal multiple episodes of microscopic hematuria now previous evaluation. No gross hematuria or UTIs.  On chronic ASA 81 mg managed by Dr. Ubaldo Glassing.  He is a current smoker, 1 ppd  Erectile dysfunction Able to achieve but not maintain an erection. He reports that he loses interest.   History of kidney stones History of recurrent nephrolithiasis, multiple stone procedures including ESWL in the past.  Asymptomatic 12 mm left upper pole renal stone on most recent CT scan. No recent flank pain.  Renal cysts Multiple enlarging exophytic renal cyst, underwent further workup with MRI 08/2016 of the abdomen characterized lesions as simple cyst.  Largest cyst 1.3 cm on right kidney.  PMH: Past Medical History:    Diagnosis Date  . Anxiety   . BBB (bundle branch block)    RIGHT AND LEFT  . Chronic fatigue and malaise    with STM loss  . Concussion 1993   with long term memory loss  . Coronary artery disease   . Emphysema lung ([place])   . GERD (gastroesophageal reflux disease)   . HH (hiatus hernia)   . History of kidney stones   . Hyperlipidemia   . Hypertension   . Myocardial infarction [facility])     Surgical History: Past Surgical History:  Procedure Laterality Date  . CARDIAC CATHETERIZATION    . COLONOSCOPY WITH PROPOFOL N/A 02/16/2016   Procedure: COLONOSCOPY WITH PROPOFOL;  Surgeon: Lollie Sails, MD;  Location: [facility] ENDOSCOPY;  Service: Endoscopy;  Laterality: N/A;  . CORONARY ARTERY BYPASS GRAFT     2000      . ESOPHAGOGASTRODUODENOSCOPY (EGD) WITH PROPOFOL N/A 02/16/2016   Procedure: ESOPHAGOGASTRODUODENOSCOPY (EGD) WITH PROPOFOL;  Surgeon: Lollie Sails, MD;  Location: [facility] ENDOSCOPY;  Service: Endoscopy;  Laterality: N/A;  . KIDNEY SURGERY     REMOVED STONE FROM RT KIDNEY  . LITHOTRIPSY     X2   . LUMBAR LAMINECTOMY/DECOMPRESSION MICRODISCECTOMY Bilateral 02/04/2015   Procedure: LUMBAR LAMINECTOMY/DECOMPRESSION MICRODISCECTOMY  Bilateral - Lumbar two-three lumbar three-four lumbar four-five lumbar five sacral one ;  Surgeon: Leeroy Cha, MD;  Location: [place] NEURO ORS;  Service: Neurosurgery;  Laterality: Bilateral;  . PILONIDAL CYST / SINUS EXCISION      Home Medications:  Allergies as of 12/07/2016  Reactions   Donepezil    Doxazosin Hives   Trazodone And Nefazodone Other (See Comments)   shivers   Hctz [hydrochlorothiazide] Rash   blisters      Medication List       Accurate as of 12/07/16  9:37 PM. Always use your most recent med list.          acetaminophen 500 MG tablet Commonly known as:  TYLENOL Take 500 mg by mouth every 6 (six) hours as needed. pain   amitriptyline 25 MG tablet Commonly known as:  ELAVIL Take 1 tablet by mouth daily.    aspirin 81 MG chewable tablet Chew 81 mg by mouth daily.   BENEFIBER DRINK MIX PO Take 5 mLs by mouth daily.   carbidopa-levodopa 25-100 MG tablet Commonly known as:  SINEMET IR Take 1 tablet by mouth 3 (three) times daily.   carvedilol 12.5 MG tablet Commonly known as:  COREG Take 1 tablet (12.5 mg total) by mouth 2 (two) times daily with a meal.   cholecalciferol 1000 units tablet Commonly known as:  VITAMIN D Take 2,000 Units by mouth daily.   clonazePAM 2 MG tablet Commonly known as:  KLONOPIN Take 2 mg by mouth at bedtime.   esomeprazole 20 MG capsule Commonly known as:  NEXIUM Take 20 mg by mouth daily.   HYDROcodone-acetaminophen 5-325 MG tablet Commonly known as:  NORCO/VICODIN Take 1 tablet by mouth every 6 (six) hours as needed for moderate pain.   losartan 50 MG tablet Commonly known as:  COZAAR Take by mouth.   mirtazapine 30 MG tablet Commonly known as:  REMERON Take 30 mg by mouth at bedtime.   MULTI-VITAMINS Tabs Take 1 tablet by mouth daily.   nitroGLYCERIN 0.4 MG SL tablet Commonly known as:  NITROSTAT Place 1 tablet (0.4 mg total) under the tongue every 5 (five) minutes as needed for chest pain.   pantoprazole 40 MG tablet Commonly known as:  PROTONIX Take 1 tablet by mouth daily.   PREVAGEN 10 MG Caps Generic drug:  Apoaequorin Take 1 capsule by mouth daily.   simvastatin 20 MG tablet Commonly known as:  ZOCOR Take 20 mg by mouth daily.   tamsulosin 0.4 MG Caps capsule Commonly known as:  FLOMAX Take 1 capsule by mouth daily.   vitamin B-12 1000 MCG tablet Commonly known as:  CYANOCOBALAMIN Take 2 tablets by mouth daily.       Allergies:  Allergies  Allergen Reactions  . Donepezil   . Doxazosin Hives  . Trazodone And Nefazodone Other (See Comments)    shivers  . Hctz [Hydrochlorothiazide] Rash    blisters    Family History: Family History  Problem Relation Age of Onset  . Varicose Veins Mother   . Heart disease  Father     Social History:  reports that he has been smoking Cigarettes.  He has been smoking about 1.00 pack per day. He has never used smokeless tobacco. He reports that he drinks alcohol. He reports that he does not use drugs.  ROS: UROLOGY Frequent Urination?: Yes Hard to postpone urination?: Yes Burning/pain with urination?: No Get up at night to urinate?: Yes Leakage of urine?: No Urine stream starts and stops?: Yes Trouble starting stream?: Yes Do you have to strain to urinate?: No Blood in urine?: No Urinary tract infection?: No Sexually transmitted disease?: No Injury to kidneys or bladder?: No Painful intercourse?: No Weak stream?: Yes Erection problems?: Yes Penile pain?: No  Gastrointestinal Nausea?: No Vomiting?:  No Indigestion/heartburn?: Yes Diarrhea?: No Constipation?: Yes  Constitutional Fever: No Night sweats?: No Weight loss?: Yes Fatigue?: Yes  Skin Skin rash/lesions?: Yes Itching?: No  Eyes Blurred vision?: Yes Double vision?: No  Ears/Nose/Throat Sore throat?: No Sinus problems?: No  Hematologic/Lymphatic Swollen glands?: No Easy bruising?: No  Cardiovascular Leg swelling?: No Chest pain?: No  Respiratory Cough?: No Shortness of breath?: No  Endocrine Excessive thirst?: No  Musculoskeletal Back pain?: Yes Joint pain?: Yes  Neurological Headaches?: No Dizziness?: No  Psychologic Depression?: No Anxiety?: No  Physical Exam: Vitals reviewed, see EPIC Constitutional:  Alert and oriented, No acute distress.  Accompanied by wife today. HEENT: [place] AT, moist mucus membranes.  Trachea midline, no masses. Cardiovascular: No clubbing, cyanosis, or edema. Respiratory: Normal respiratory effort, no increased work of breathing. GI: Abdomen is soft, nontender, nondistended, no abdominal masses GU: No CVA tenderness.  Rectal: Normal sphincter tone. Enlarged 50 cc prostate, abnormal firmness which is longitudinal extending from  base to mid prostate. Possible additional nodule measuring 1 cm left base. Skin: No rashes, bruises or suspicious lesions. Neurologic: Grossly intact, no focal deficits, moving all 4 extremities. Psychiatric: Normal mood and affect.  Laboratory Data: Lab Results  Component Value Date   WBC 6.3 02/04/2015   HGB 16.8 02/04/2015   HCT 47.9 02/04/2015   MCV 98.6 02/04/2015   PLT 95 (L) 02/04/2015    Lab Results  Component Value Date   CREATININE 1.00 11/25/2015   PSA as above  Urinalysis Multiple urinalyses from care everywhere reviewed  Pertinent Imaging: CT scan from 09/08/16 personally reviewed as well as MR abd from 09/15/2016 as above  Assessment & Plan:    1. Elevated PSA Slowly rising PSA with abnormal nodular prostate on examination and concerning CT scan findings. I've recommended proceeding with prostate biopsy.   We discussed prostate biopsy in detail including the procedure itself, the risks of blood in the urine, stool, and ejaculate, serious infection, and discomfort. He is willing to proceed with this as discussed.  Hold ASA 7 days prior to procedure, will obtain clearance from Dr. Ubaldo Glassing at his f/u tomorrow  2. Abnormal digital rectal exam As above  3. BPH with obstruction/lower urinary tract symptoms Discussed general defect was a voiding symptoms including probable BPH with bladder outlet obstruction as well as Parkinson's  We'll have to optimize medical therapy once the above issues have been addressed  4. Microscopic hematuria Given his extensive smoking history, which strongly recommend cystoscopy to rule out the other bladder pathology  5. Erectile dysfunction due to diseases classified elsewhere Briefly discussed, we'll defer treatment at this time  6. Kidney stone on left side Previously did well with shockwave lithotripsy, interested in the future We'll defer treatment until above workup is completed  7. Renal cyst Benign cyst based on follow-up  MRI, no further workup needed   Return in about 2 weeks (around 12/21/2016) for cysto/ prostate biopsy.  Hollice Espy, MD  [facility] [address] [place], [place] [address] [phone]

## 2016-12-14 ENCOUNTER — Telehealth: Payer: Self-pay

## 2016-12-14 NOTE — Telephone Encounter (Signed)
Per Dr. Ubaldo Glassing patient was notified that he is ok to stop ASA 7 days prior to prostate biopsy. Patient notified and verbalized understanding

## 2016-12-14 NOTE — Telephone Encounter (Signed)
Left pt mess to call in regards to Clearance for prostate biopsy

## 2016-12-24 ENCOUNTER — Encounter: Payer: Self-pay | Admitting: Urology

## 2016-12-24 ENCOUNTER — Other Ambulatory Visit: Payer: Self-pay | Admitting: Urology

## 2016-12-24 ENCOUNTER — Ambulatory Visit (INDEPENDENT_AMBULATORY_CARE_PROVIDER_SITE_OTHER): Payer: Medicare Other | Admitting: Urology

## 2016-12-24 VITALS — BP 128/77 | HR 69 | Ht 69.0 in | Wt 152.0 lb

## 2016-12-24 DIAGNOSIS — R972 Elevated prostate specific antigen [PSA]: Secondary | ICD-10-CM | POA: Diagnosis not present

## 2016-12-24 DIAGNOSIS — R3129 Other microscopic hematuria: Secondary | ICD-10-CM

## 2016-12-24 LAB — URINALYSIS, COMPLETE
BILIRUBIN UA: NEGATIVE
Glucose, UA: NEGATIVE
LEUKOCYTES UA: NEGATIVE
Nitrite, UA: NEGATIVE
Specific Gravity, UA: >1.03 — ABNORMAL HIGH (ref 1.005–1.030)
Urobilinogen, Ur: 0.2 mg/dL (ref 0.2–1.0)
pH, UA: 5 (ref 5.0–7.5)

## 2016-12-24 LAB — MICROSCOPIC EXAMINATION
EPITHELIAL CELLS (NON RENAL): NONE SEEN /HPF (ref 0–10)
WBC, UA: NONE SEEN /hpf (ref 0–?)

## 2016-12-24 MED ORDER — LEVOFLOXACIN 500 MG PO TABS
500.0000 mg | ORAL_TABLET | Freq: Once | ORAL | Status: AC
  Administered 2016-12-24: 500 mg via ORAL

## 2016-12-24 MED ORDER — LIDOCAINE HCL 2 % EX GEL
1.0000 "application " | Freq: Once | CUTANEOUS | Status: AC
  Administered 2016-12-24: 1 via URETHRAL

## 2016-12-24 MED ORDER — GENTAMICIN SULFATE 40 MG/ML IJ SOLN
80.0000 mg | Freq: Once | INTRAMUSCULAR | Status: AC
  Administered 2016-12-24: 80 mg via INTRAMUSCULAR

## 2016-12-24 NOTE — Progress Notes (Signed)
   12/27/16  CC: No chief complaint on file.   HPI: 76 year old male with microscopic hematuria as well as an elevated PSA with abnormal prostatic enhancement as well as an abnormal rectal exam.  He is accompanied today by his wife who remains in the room to accompany her husband for both procedures today.  Blood pressure 128/77, pulse 69, height 5\' 9"  (1.753 m), weight 152 lb (68.9 kg). NED. A&Ox3.   No respiratory distress   Abd soft, NT, ND Normal phallus with orthotopic patent meatus Normal sphincter tone   Cystoscopy Procedure Note  Patient identification was confirmed, informed consent was obtained, and patient was prepped using Betadine solution.  Lidocaine jelly was administered per urethral meatus.    Preoperative abx where received prior to procedure.     Pre-Procedure: - Inspection reveals a normal caliber ureteral meatus.  Procedure: The flexible cystoscope was introduced without difficulty - No urethral strictures/lesions are present. - Normal prostate  - minimally elevated bladder neck - Bilateral ureteral orifices identified - Bladder mucosa  reveals no ulcers, tumors, or lesions - No bladder stones - Mild trabeculation  Retroflexion unremarkable   Post-Procedure: - Patient tolerated the procedure well  Assessment/ Plan:  Prostate Biopsy Procedure   Informed consent was obtained after discussing risks/benefits of the procedure.  A time out was performed to ensure correct patient identity.  Pre-Procedure: - Gentamicin given prophylactically - Levaquin 500 mg administered PO -Transrectal Ultrasound performed revealing a 33 gm prostate -No significant median lobe. There is a hypoechoic lesion in the right anterior aspect of the prostate. This was sampled with the right base with 2 additional samples.  Procedure: - Prostate block performed using 10 cc 1% lidocaine and biopsies taken from sextant areas, a total of 12 under ultrasound guidance (as  above)  Post-Procedure: - Patient tolerated the procedure well - He was counseled to seek immediate medical attention if experiences any severe pain, significant bleeding, or fevers - Return in one week to discuss biopsy results  Assessment/ Plan:  1. Elevated PSA Elevated PSA with abnormal rectal exam Status post uncomplicated biopsy today Return to care to discuss pathology results, highly suspicious for prostate cancer - gentamicin (GARAMYCIN) injection 80 mg; Inject 2 mLs (80 mg total) into the muscle once. - levofloxacin (LEVAQUIN) tablet 500 mg; Take 1 tablet (500 mg total) by mouth once. - lidocaine (XYLOCAINE) 2 % jelly 1 application; Place 1 application into the urethra once.  2. Microscopic hematuria Cystoscopy unremarkable today Previous imaging including CT abdomen pelvis and MRI with irregular prostate  - Urinalysis, Complete   Return in about 1 week (around 12/31/2016) for prostate biopsy results.  Hollice Espy, MD

## 2016-12-31 ENCOUNTER — Telehealth: Payer: Self-pay

## 2016-12-31 LAB — PATHOLOGY REPORT

## 2016-12-31 NOTE — Telephone Encounter (Signed)
Pt wife called stating pt is now having bright red blood in urine after a prostate bx and cysto. Reinforced with wife this is normal and can last for several weeks. Reinforced with wife pt should increase his fluid in take. Wife voiced understanding.

## 2017-01-03 ENCOUNTER — Other Ambulatory Visit: Payer: Self-pay | Admitting: Urology

## 2017-01-05 ENCOUNTER — Encounter: Payer: Self-pay | Admitting: Urology

## 2017-01-05 ENCOUNTER — Ambulatory Visit (INDEPENDENT_AMBULATORY_CARE_PROVIDER_SITE_OTHER): Payer: Medicare Other | Admitting: Urology

## 2017-01-05 ENCOUNTER — Other Ambulatory Visit: Payer: Self-pay | Admitting: Urology

## 2017-01-05 VITALS — BP 121/75 | Ht 68.0 in | Wt 155.0 lb

## 2017-01-05 DIAGNOSIS — N200 Calculus of kidney: Secondary | ICD-10-CM | POA: Diagnosis not present

## 2017-01-05 DIAGNOSIS — C61 Malignant neoplasm of prostate: Secondary | ICD-10-CM

## 2017-01-07 NOTE — Progress Notes (Signed)
01/05/2017 7:58 AM   Douglas Clark 1940/11/23 [id number]  Referring provider: Idelle Crouch, MD [facility] [place], [place] [address]  Chief Complaint  Patient presents with  . Prostate Cancer    Results    HPI: 76 year old male who presents today following prostate biopsy on 12/24/2016. He was initially referred for elevated PSA, up to 5.09 which would been slowly rising over the course of 2 years.  Rectal exam was abnormal with induration bilaterally and a nodule at the apex. In addition to this, he had a 1.7 cm nodular focus of hyperenhancement in the peripheral zone of the mid prostate on CT scan incidentally on 09/08/2016. There is no evidence of lymphadenopathy at that point. He's also had recent weight loss.  Prostate biopsy revealed 12 of 12 cores positive for prostate cancer, Gleason 3+4, highest volume on the right up to 99% of the tissue. There is evidence of perineural invasion.  TRUS volume 33 g    BPH with urinary frequency and weak stream Chronic weak stream, difficulty starting stream.  Urinary frequency with nocturia x 2-3.  He also has urinary urgency with urgency with rare urge incontience and wears depends when he goes out for protection.    Underwent "heat shrinking" prostate procedure in 2005 which was ineffective.  Chronically on flomax.    Microscopic hematuria Review of records reveal multiple episodes of microscopic hematuria now previous evaluation. No gross hematuria or UTIs.  On chronic ASA 81 mg managed by Dr. Ubaldo Glassing.  He is a current smoker, 1 ppd  S/p cysto at the time of prostate biopsy without evidence of bladder pathology    Erectile dysfunction Able to achieve but not maintain an erection. He reports that he loses interest.   History of kidney stones History of recurrent nephrolithiasis, multiple stone procedures including ESWL in the past.  Asymptomatic 12 mm left upper pole renal stone on most  recent CT scan. No recent flank pain.  Renal cysts Multiple enlarging exophytic renal cyst, underwent further workup with MRI 08/2016 of the abdomen characterized lesions as simple cyst.  Largest cyst 1.3 cm on right kidney.     PMH: Past Medical History:  Diagnosis Date  . Anxiety   . BBB (bundle branch block)    RIGHT AND LEFT  . Chronic fatigue and malaise    with STM loss  . Concussion 1993   with long term memory loss  . Coronary artery disease   . Emphysema lung ([place])   . GERD (gastroesophageal reflux disease)   . HH (hiatus hernia)   . History of kidney stones   . Hyperlipidemia   . Hypertension   . Myocardial infarction [facility])     Surgical History: Past Surgical History:  Procedure Laterality Date  . CARDIAC CATHETERIZATION    . COLONOSCOPY WITH PROPOFOL N/A 02/16/2016   Procedure: COLONOSCOPY WITH PROPOFOL;  Surgeon: Lollie Sails, MD;  Location: [facility] ENDOSCOPY;  Service: Endoscopy;  Laterality: N/A;  . CORONARY ARTERY BYPASS GRAFT     2000      . ESOPHAGOGASTRODUODENOSCOPY (EGD) WITH PROPOFOL N/A 02/16/2016   Procedure: ESOPHAGOGASTRODUODENOSCOPY (EGD) WITH PROPOFOL;  Surgeon: Lollie Sails, MD;  Location: [facility] ENDOSCOPY;  Service: Endoscopy;  Laterality: N/A;  . KIDNEY SURGERY     REMOVED STONE FROM RT KIDNEY  . LITHOTRIPSY     X2   . LUMBAR LAMINECTOMY/DECOMPRESSION MICRODISCECTOMY Bilateral 02/04/2015   Procedure: LUMBAR LAMINECTOMY/DECOMPRESSION MICRODISCECTOMY  Bilateral - Lumbar two-three  lumbar three-four lumbar four-five lumbar five sacral one ;  Surgeon: Leeroy Cha, MD;  Location: [facility] NEURO ORS;  Service: Neurosurgery;  Laterality: Bilateral;  . PILONIDAL CYST / SINUS EXCISION      Home Medications:  Allergies as of 01/05/2017      Reactions   Donepezil    Doxazosin Hives   Trazodone And Nefazodone Other (See Comments)   shivers   Hctz [hydrochlorothiazide] Rash   blisters      Medication List       Accurate as of 01/05/17 11:59  PM. Always use your most recent med list.          acetaminophen 500 MG tablet Commonly known as:  TYLENOL Take 500 mg by mouth every 6 (six) hours as needed. pain   amitriptyline 25 MG tablet Commonly known as:  ELAVIL Take 1 tablet by mouth daily.   aspirin 81 MG chewable tablet Chew 81 mg by mouth daily.   BENEFIBER DRINK MIX PO Take 5 mLs by mouth daily.   carbidopa-levodopa 25-100 MG tablet Commonly known as:  SINEMET IR Take 1 tablet by mouth 3 (three) times daily.   carvedilol 12.5 MG tablet Commonly known as:  COREG Take 1 tablet (12.5 mg total) by mouth 2 (two) times daily with a meal.   cholecalciferol 1000 units tablet Commonly known as:  VITAMIN D Take 2,000 Units by mouth daily.   clonazePAM 2 MG tablet Commonly known as:  KLONOPIN Take 2 mg by mouth at bedtime.   esomeprazole 20 MG capsule Commonly known as:  NEXIUM Take 20 mg by mouth daily.   HYDROcodone-acetaminophen 5-325 MG tablet Commonly known as:  NORCO/VICODIN Take 1 tablet by mouth every 6 (six) hours as needed for moderate pain.   losartan 50 MG tablet Commonly known as:  COZAAR Take by mouth.   mirtazapine 30 MG tablet Commonly known as:  REMERON Take 30 mg by mouth at bedtime.   MULTI-VITAMINS Tabs Take 1 tablet by mouth daily.   nitroGLYCERIN 0.4 MG SL tablet Commonly known as:  NITROSTAT Place 1 tablet (0.4 mg total) under the tongue every 5 (five) minutes as needed for chest pain.   pantoprazole 40 MG tablet Commonly known as:  PROTONIX Take 1 tablet by mouth daily.   PREVAGEN 10 MG Caps Generic drug:  Apoaequorin Take 1 capsule by mouth daily.   simvastatin 20 MG tablet Commonly known as:  ZOCOR Take 20 mg by mouth daily.   tamsulosin 0.4 MG Caps capsule Commonly known as:  FLOMAX Take 1 capsule by mouth daily.   vitamin B-12 1000 MCG tablet Commonly known as:  CYANOCOBALAMIN Take 2 tablets by mouth daily.       Allergies:  Allergies  Allergen Reactions    . Donepezil   . Doxazosin Hives  . Trazodone And Nefazodone Other (See Comments)    shivers  . Hctz [Hydrochlorothiazide] Rash    blisters    Family History: Family History  Problem Relation Age of Onset  . Varicose Veins Mother   . Heart disease Father     Social History:  reports that he has been smoking Cigarettes.  He has been smoking about 1.00 pack per day. He has never used smokeless tobacco. He reports that he drinks alcohol. He reports that he does not use drugs.  ROS: UROLOGY Frequent Urination?: Yes Hard to postpone urination?: Yes Burning/pain with urination?: No Get up at night to urinate?: Yes Leakage of urine?: Yes Urine stream starts and  stops?: Yes Trouble starting stream?: Yes Do you have to strain to urinate?: No Blood in urine?: Yes Urinary tract infection?: No Sexually transmitted disease?: No Injury to kidneys or bladder?: No Painful intercourse?: No Weak stream?: Yes Erection problems?: No Penile pain?: No  Gastrointestinal Nausea?: No Vomiting?: No Indigestion/heartburn?: Yes Diarrhea?: No Constipation?: Yes  Constitutional Fever: No Night sweats?: No Weight loss?: No Fatigue?: No  Skin Skin rash/lesions?: No Itching?: No  Eyes Blurred vision?: No Double vision?: No  Ears/Nose/Throat Sore throat?: No Sinus problems?: No  Hematologic/Lymphatic Swollen glands?: No Easy bruising?: No  Cardiovascular Leg swelling?: Yes Chest pain?: No  Respiratory Cough?: No Shortness of breath?: No  Endocrine Excessive thirst?: No  Musculoskeletal Back pain?: Yes Joint pain?: No  Neurological Headaches?: No Dizziness?: No  Psychologic Depression?: Yes Anxiety?: Yes  Physical Exam: BP 121/75   Ht 5\' 8"  (1.727 m)   Wt 155 lb (70.3 kg)   BMI 23.57 kg/m   Constitutional:  Alert and oriented, No acute distress. HEENT: [place] AT, moist mucus membranes.  Trachea midline, no masses. Cardiovascular: No clubbing, cyanosis, or  edema. Respiratory: Normal respiratory effort, no increased work of breathing. Skin: No rashes, bruises or suspicious lesions. Neurologic: Grossly intact, no focal deficits, moving all 4 extremities. Psychiatric: Normal mood and affect.  Laboratory Data: Lab Results  Component Value Date   WBC 6.3 02/04/2015   HGB 16.8 02/04/2015   HCT 47.9 02/04/2015   MCV 98.6 02/04/2015   PLT 95 (L) 02/04/2015    Lab Results  Component Value Date   CREATININE 1.00 11/25/2015    PSA as above  Urinalysis N/a  Pertinent Imaging: CT scan from 09/08/2016 reviewed again today, no evidence of pelvic lymphadenopathy or metastatic disease at that time.  Assessment & Plan:    1. Prostate cancer [facility]) 76 year old male with a newly diagnosed high volume intermediate risk prostate cancer, path as above.  The patient was counseled about the natural history of prostate cancer and the standard treatment options that are available for prostate cancer. It was explained to him how his age and life expectancy, clinical stage, Gleason score, and PSA affect his prognosis, the decision to proceed with additional staging studies, as well as how that information influences recommended treatment strategies. We discussed the roles for active surveillance, radiation therapy, surgical therapy, androgen deprivation, as well as ablative therapy options for the treatment of prostate cancer as appropriate to his individual cancer situation. We discussed the risks and benefits of these options with regard to their impact on cancer control and also in terms of potential adverse events, complications, and impact on quality of life particularly related to urinary, bowel, and sexual function. The patient was encouraged to ask questions throughout the discussion today and all questions were answered to his stated satisfaction. In addition, the patient was providedwith and/or directed to appropriate resources and literature for further  education about prostate cancer treatment options.  Given his age and comorbidities, he is not a candidate for robotic prostatectomy. I would highly recommend consideration of EBRT with Lupron x 6 months.  Plan for referral to radiation oncology to discuss this further.  At this point in time, we'll defer ordering additional staging scans given fairly low PSA and recent CT from 08/2016.    We discussed side effects of Lupron at length today.  Side effects including weight gain, hot flashes, cardiac side effects, bone loss were all reviewed.  We'll hold off on administering this medication until he has  decided how to proceed. This could be given at our office or with by Dr. Baruch Gouty.  - Ambulatory referral to Radiation Oncology  2. Kidney stone on left side  Asymptomatic, we'll defer treatment until prostate cancer is been addressed   Return in about 3 months (around 04/07/2017) for rehcheck prostate cancer.  Hollice Espy, MD  [facility] [address] [place], [place] [address] [phone]  I spent 40 min with this patient of which greater than 50% was spent in counseling and coordination of care with the patient.

## 2017-01-20 ENCOUNTER — Ambulatory Visit: Payer: Medicare Other | Admitting: Radiation Oncology

## 2017-01-21 ENCOUNTER — Ambulatory Visit
Admission: RE | Admit: 2017-01-21 | Discharge: 2017-01-21 | Disposition: A | Discharge status: Home / Self Care | Payer: Medicare Other | Source: Ambulatory Visit | Attending: Radiation Oncology | Admitting: Radiation Oncology

## 2017-01-21 VITALS — BP 141/79 | HR 58 | Temp 96.9°F | Wt 158.7 lb

## 2017-01-21 DIAGNOSIS — K219 Gastro-esophageal reflux disease without esophagitis: Secondary | ICD-10-CM | POA: Insufficient documentation

## 2017-01-21 DIAGNOSIS — I252 Old myocardial infarction: Secondary | ICD-10-CM | POA: Insufficient documentation

## 2017-01-21 DIAGNOSIS — N2889 Other specified disorders of kidney and ureter: Secondary | ICD-10-CM | POA: Diagnosis not present

## 2017-01-21 DIAGNOSIS — F419 Anxiety disorder, unspecified: Secondary | ICD-10-CM | POA: Insufficient documentation

## 2017-01-21 DIAGNOSIS — G20 Parkinson's disease: Secondary | ICD-10-CM | POA: Diagnosis not present

## 2017-01-21 DIAGNOSIS — Z7982 Long term (current) use of aspirin: Secondary | ICD-10-CM | POA: Insufficient documentation

## 2017-01-21 DIAGNOSIS — I454 Nonspecific intraventricular block: Secondary | ICD-10-CM | POA: Diagnosis not present

## 2017-01-21 DIAGNOSIS — K449 Diaphragmatic hernia without obstruction or gangrene: Secondary | ICD-10-CM | POA: Diagnosis not present

## 2017-01-21 DIAGNOSIS — C61 Malignant neoplasm of prostate: Secondary | ICD-10-CM | POA: Diagnosis not present

## 2017-01-21 DIAGNOSIS — E785 Hyperlipidemia, unspecified: Secondary | ICD-10-CM | POA: Insufficient documentation

## 2017-01-21 DIAGNOSIS — I10 Essential (primary) hypertension: Secondary | ICD-10-CM | POA: Diagnosis not present

## 2017-01-21 DIAGNOSIS — I2510 Atherosclerotic heart disease of native coronary artery without angina pectoris: Secondary | ICD-10-CM | POA: Insufficient documentation

## 2017-01-21 DIAGNOSIS — R531 Weakness: Secondary | ICD-10-CM | POA: Insufficient documentation

## 2017-01-21 DIAGNOSIS — Z79899 Other long term (current) drug therapy: Secondary | ICD-10-CM | POA: Insufficient documentation

## 2017-01-21 DIAGNOSIS — R5383 Other fatigue: Secondary | ICD-10-CM | POA: Diagnosis not present

## 2017-01-21 DIAGNOSIS — Z923 Personal history of irradiation: Secondary | ICD-10-CM | POA: Diagnosis not present

## 2017-01-21 DIAGNOSIS — K5900 Constipation, unspecified: Secondary | ICD-10-CM | POA: Diagnosis not present

## 2017-01-21 DIAGNOSIS — F17210 Nicotine dependence, cigarettes, uncomplicated: Secondary | ICD-10-CM | POA: Insufficient documentation

## 2017-01-21 DIAGNOSIS — Z87442 Personal history of urinary calculi: Secondary | ICD-10-CM | POA: Diagnosis not present

## 2017-01-21 DIAGNOSIS — Z510 Encounter for antineoplastic radiation therapy: Secondary | ICD-10-CM | POA: Diagnosis present

## 2017-01-21 DIAGNOSIS — J439 Emphysema, unspecified: Secondary | ICD-10-CM | POA: Insufficient documentation

## 2017-01-21 NOTE — Consult Note (Signed)
NEW PATIENT EVALUATION  Name: Douglas Clark  MRN: [id number]  Date:   01/21/2017     DOB: Jun 20, 1940   This 76 y.o. male patient presents to the clinic for initial evaluation of stage IIB (T2 cN0 M0) Gleason 7 (3+4) adenocarcinoma the prostate presenting the PSA of 5.  REFERRING PHYSICIAN: Idelle Crouch, MD  CHIEF COMPLAINT:  Chief Complaint  Patient presents with  . Prostate Cancer    Initial Evaluation    DIAGNOSIS: The encounter diagnosis was Malignant neoplasm of prostate ([place]).   PREVIOUS INVESTIGATIONS:  Pathology reports reviewed Clinical notes reviewed  HPI: Patient is a 76 year old male with Parkinson's disease who is been noted for the past years having rising PSA most recently 5.09. He also was noted on rectal examination to have induration bilaterally. CT scan showed prostamegaly with a nodular focus measuring 1.7 cm in the peripheral zone of the right mid prostate. He also has an exophytic 1.3 cm renal cortical lesion of the right kidney indeterminate for renal cell carcinoma. No evidence of abdominal or pelvic adenopathy is detected. MRI of his abdomen showed a lesion of the right kidney showed no enhancement and was consistent with a small hemorrhagic cyst. Patient underwent transrectal ultrasound-guided biopsy of his prostate showing all 12 cores positive for adenocarcinoma a mixture of Gleason 6 and Gleason 7 (3+4). He's been evaluated by urology and recognition is been 48 DT plus external beam radiation therapy. He is seen today for opinion. He does wear depends undergarment although does suffer from Parkinson's disease. He specifically denies any other increased lower urinary tract symptoms has nocturia less than 1. His bowel movements tend towards constipation.  PLANNED TREATMENT REGIMEN: Image guided IMRT  radiation therapy  PAST MEDICAL HISTORY:  has a past medical history of Anxiety; BBB (bundle branch block); Chronic fatigue and malaise; Concussion (1993);  Coronary artery disease; Emphysema lung ([facility]); GERD (gastroesophageal reflux disease); HH (hiatus hernia); History of kidney stones; Hyperlipidemia; Hypertension; and Myocardial infarction ([place]).    PAST SURGICAL HISTORY:  Past Surgical History:  Procedure Laterality Date  . CARDIAC CATHETERIZATION    . COLONOSCOPY WITH PROPOFOL N/A 02/16/2016   Procedure: COLONOSCOPY WITH PROPOFOL;  Surgeon: Lollie Sails, MD;  Location[facility] ENDOSCOPY;  Service: Endoscopy;  Laterality: N/A;  . CORONARY ARTERY BYPASS GRAFT     2000      . ESOPHAGOGASTRODUODENOSCOPY (EGD) WITH PROPOFOL N/A 02/16/2016   Procedure: ESOPHAGOGASTRODUODENOSCOPY (EGD) WITH PROPOFOL;  Surgeon: Lollie Sails, MD;  Location: [facility] ENDOSCOPY;  Service: Endoscopy;  Laterality: N/A;  . KIDNEY SURGERY     REMOVED STONE FROM RT KIDNEY  . LITHOTRIPSY     X2   . LUMBAR LAMINECTOMY/DECOMPRESSION MICRODISCECTOMY Bilateral 02/04/2015   Procedure: LUMBAR LAMINECTOMY/DECOMPRESSION MICRODISCECTOMY  Bilateral - Lumbar two-three lumbar three-four lumbar four-five lumbar five sacral one ;  Surgeon: Leeroy Cha, MD;  Location: [place] NEURO ORS;  Service: Neurosurgery;  Laterality: Bilateral;  . PILONIDAL CYST / SINUS EXCISION      FAMILY HISTORY: family history includes Heart disease in his father; Varicose Veins in his mother.  SOCIAL HISTORY:  reports that he has been smoking Cigarettes.  He has been smoking about 1.00 pack per day. He has never used smokeless tobacco. He reports that he drinks alcohol. He reports that he does not use drugs.  ALLERGIES: Donepezil; Doxazosin; Trazodone and nefazodone; and Hctz [hydrochlorothiazide]  MEDICATIONS:  Current Outpatient Prescriptions  Medication Sig Dispense Refill  . acetaminophen (TYLENOL) 500 MG tablet Take 500  mg by mouth every 6 (six) hours as needed. pain    . amitriptyline (ELAVIL) 25 MG tablet Take 1 tablet by mouth daily.    Marland Kitchen Apoaequorin (PREVAGEN) 10 MG CAPS Take 1 capsule by  mouth daily.    Marland Kitchen aspirin 81 MG chewable tablet Chew 81 mg by mouth daily.    . carbidopa-levodopa (SINEMET IR) 25-100 MG tablet Take 1 tablet by mouth 3 (three) times daily. 90 tablet 6  . carvedilol (COREG) 12.5 MG tablet Take 1 tablet (12.5 mg total) by mouth 2 (two) times daily with a meal. 60 tablet 10  . cholecalciferol (VITAMIN D) 1000 UNITS tablet Take 2,000 Units by mouth daily.    . clonazePAM (KLONOPIN) 2 MG tablet Take 2 mg by mouth at bedtime.    Marland Kitchen esomeprazole (NEXIUM) 20 MG capsule Take 20 mg by mouth daily.    Marland Kitchen HYDROcodone-acetaminophen (NORCO/VICODIN) 5-325 MG per tablet Take 1 tablet by mouth every 6 (six) hours as needed for moderate pain.    Marland Kitchen losartan (COZAAR) 50 MG tablet Take by mouth.    . mirtazapine (REMERON) 30 MG tablet Take 30 mg by mouth at bedtime.    . Multiple Vitamin (MULTI-VITAMINS) TABS Take 1 tablet by mouth daily.    . nitroGLYCERIN (NITROSTAT) 0.4 MG SL tablet Place 1 tablet (0.4 mg total) under the tongue every 5 (five) minutes as needed for chest pain. 25 tablet 3  . pantoprazole (PROTONIX) 40 MG tablet Take 1 tablet by mouth daily.    . simvastatin (ZOCOR) 20 MG tablet Take 20 mg by mouth daily.    . tamsulosin (FLOMAX) 0.4 MG CAPS capsule Take 1 capsule by mouth daily.    . vitamin B-12 (CYANOCOBALAMIN) 1000 MCG tablet Take 2 tablets by mouth daily.    . Wheat Dextrin (BENEFIBER DRINK MIX PO) Take 5 mLs by mouth daily.     No current facility-administered medications for this encounter.     ECOG PERFORMANCE STATUS:  0 - Asymptomatic  REVIEW OF SYSTEMS: Patient does have Parkinson's disease quite fatigued and does have some short-term memory loss. Otherwise Patient denies any weight loss, fatigue, weakness, fever, chills or night sweats. Patient denies any loss of vision, blurred vision. Patient denies any ringing  of the ears or hearing loss. No irregular heartbeat. Patient denies heart murmur or history of fainting. Patient denies any chest pain or  pain radiating to her upper extremities. Patient denies any shortness of breath, difficulty breathing at night, cough or hemoptysis. Patient denies any swelling in the lower legs. Patient denies any nausea vomiting, vomiting of blood, or coffee ground material in the vomitus. Patient denies any stomach pain. Patient states has had normal bowel movements no significant constipation or diarrhea. Patient denies any dysuria, hematuria or significant nocturia. Patient denies any problems walking, swelling in the joints or loss of balance. Patient denies any skin changes, loss of hair or loss of weight. Patient denies any excessive worrying or anxiety or significant depression. Patient denies any problems with insomnia. Patient denies excessive thirst, polyuria, polydipsia. Patient denies any swollen glands, patient denies easy bruising or easy bleeding. Patient denies any recent infections, allergies or URI. Patient "s visual fields have not changed significantly in recent time.    PHYSICAL EXAM: BP (!) 141/79   Pulse (!) 58   Temp (!) 96.9 F (36.1 C)   Wt 158 lb 11.7 oz (72 kg)   BMI 24.14 kg/m  On rectal exam rectal sphincter tone is good  prostate has bilateral nodularity and induration. Sulcus is preserved bilaterally. No other rectal abnormality is identified. Patient also has sequela of Parkinson's disease. Well-developed well-nourished patient in NAD. HEENT reveals PERLA, EOMI, discs not visualized.  Oral cavity is clear. No oral mucosal lesions are identified. Neck is clear without evidence of cervical or supraclavicular adenopathy. Lungs are clear to A&P. Cardiac examination is essentially unremarkable with regular rate and rhythm without murmur rub or thrill. Abdomen is benign with no organomegaly or masses noted. Motor sensory and DTR levels are equal and symmetric in the upper and lower extremities. Cranial nerves II through XII are grossly intact. Proprioception is intact. No peripheral  adenopathy or edema is identified. No motor or sensory levels are noted. Crude visual fields are within normal range.   LABORATORY DATA: Pathology reports reviewed    RADIOLOGY RESULTS: CT scan and MRI scans reviewed  IMPRESSION: Stage IIB Gleason 7 adenocarcinoma the prostate in 76 year old male with Parkinson's disease for image guided I MRT radiation therapy.  PLAN: At this time I agree with urology's assessment. I would recommend image guided I MRT radiation therapy to his prostate. Would plan on delivering 8000 cGy over 8 weeks. I've asked Dr. Erlene Quan to place fiduciary markers in his prostate for daily image guided treatment. Risks and benefits of treatment including increased lower urinary tract symptoms, diarrhea, alteration of blood counts skin reaction fatigue all were discussed in detail. Patient seems to comprehend my treatment plan well. Will make a simulation appointment when we have verification of his marker placement. Patient wife both seem to comprehend my treatment plan well.  I would like to take this opportunity to thank you for allowing me to participate in the care of your patient.Armstead Peaks., MD

## 2017-01-24 ENCOUNTER — Telehealth: Payer: Self-pay | Admitting: *Deleted

## 2017-01-24 NOTE — Telephone Encounter (Signed)
Mr. Mckiver notified of appointment for CT simulation on Mon. 02/07/17 at 10:00.  Pt acknowledged with read back of appointment date and time.

## 2017-02-02 ENCOUNTER — Encounter: Payer: Self-pay | Admitting: Urology

## 2017-02-02 ENCOUNTER — Ambulatory Visit (INDEPENDENT_AMBULATORY_CARE_PROVIDER_SITE_OTHER): Payer: Medicare Other | Admitting: Urology

## 2017-02-02 VITALS — BP 109/65 | HR 62 | Ht 70.0 in | Wt 155.0 lb

## 2017-02-02 DIAGNOSIS — C61 Malignant neoplasm of prostate: Secondary | ICD-10-CM

## 2017-02-02 MED ORDER — LEVOFLOXACIN 500 MG PO TABS
500.0000 mg | ORAL_TABLET | Freq: Once | ORAL | Status: AC
  Administered 2017-02-02: 500 mg via ORAL

## 2017-02-02 MED ORDER — GENTAMICIN SULFATE 40 MG/ML IJ SOLN
80.0000 mg | Freq: Once | INTRAMUSCULAR | Status: AC
  Administered 2017-02-02: 80 mg via INTRAMUSCULAR

## 2017-02-02 NOTE — Addendum Note (Signed)
Addended by: Tommy Rainwater on: 02/02/2017 11:31 AM   Modules accepted: Orders

## 2017-02-02 NOTE — Progress Notes (Signed)
02/02/17  CC: gold seed markers  HPI: 76 y.o. male with prostate cancer who presents today for placement of fiducial seed markers in anticipation of his upcoming IMRT with Dr. Baruch Gouty.  Prostate Gold Seed Marker Placement Procedure   Informed consent was obtained after discussing risks/benefits of the procedure.  A time out was performed to ensure correct patient identity.  Pre-Procedure: - Gentamicin given prophylactically - PO Levaquin 500 mg also given today  Procedure: -Lidocaine jelly was administered per rectum -Rectal ultrasound probe was placed without difficulty and the prostate visualized - 3 fiducial gold seed markers placed, one at right base, one at left base, one at apex of prostate gland under transrectal ultrasound guidance  Post-Procedure: - Patient tolerated the procedure well - He was counseled to seek immediate medical attention if experiences any severe pain, significant bleeding, or fevers  Return in about 3 months (around 05/05/2017) for possible Lupron, recheck urinary symptoms.   Hollice Espy, MD

## 2017-02-07 ENCOUNTER — Ambulatory Visit
Admission: RE | Admit: 2017-02-07 | Discharge: 2017-02-07 | Disposition: A | Discharge status: Home / Self Care | Payer: Medicare Other | Source: Ambulatory Visit | Attending: Radiation Oncology | Admitting: Radiation Oncology

## 2017-02-07 DIAGNOSIS — Z510 Encounter for antineoplastic radiation therapy: Secondary | ICD-10-CM | POA: Diagnosis not present

## 2017-02-08 DIAGNOSIS — Z510 Encounter for antineoplastic radiation therapy: Secondary | ICD-10-CM | POA: Diagnosis not present

## 2017-02-17 ENCOUNTER — Other Ambulatory Visit: Payer: Self-pay | Admitting: *Deleted

## 2017-02-17 ENCOUNTER — Encounter: Payer: Self-pay | Admitting: *Deleted

## 2017-02-17 DIAGNOSIS — C61 Malignant neoplasm of prostate: Secondary | ICD-10-CM

## 2017-02-18 ENCOUNTER — Encounter: Payer: Self-pay | Admitting: *Deleted

## 2017-02-18 ENCOUNTER — Encounter
Admission: RE | Disposition: A | Discharge status: Home / Self Care | Payer: Self-pay | Source: Ambulatory Visit | Attending: Gastroenterology

## 2017-02-18 ENCOUNTER — Ambulatory Visit: Payer: Medicare Other | Admitting: Anesthesiology

## 2017-02-18 ENCOUNTER — Ambulatory Visit
Admission: RE | Admit: 2017-02-18 | Discharge: 2017-02-18 | Disposition: A | Discharge status: Home / Self Care | Payer: Medicare Other | Source: Ambulatory Visit | Attending: Gastroenterology | Admitting: Gastroenterology

## 2017-02-18 DIAGNOSIS — K210 Gastro-esophageal reflux disease with esophagitis: Secondary | ICD-10-CM | POA: Diagnosis not present

## 2017-02-18 DIAGNOSIS — I2510 Atherosclerotic heart disease of native coronary artery without angina pectoris: Secondary | ICD-10-CM | POA: Insufficient documentation

## 2017-02-18 DIAGNOSIS — Z79899 Other long term (current) drug therapy: Secondary | ICD-10-CM | POA: Diagnosis not present

## 2017-02-18 DIAGNOSIS — Z8546 Personal history of malignant neoplasm of prostate: Secondary | ICD-10-CM | POA: Insufficient documentation

## 2017-02-18 DIAGNOSIS — D696 Thrombocytopenia, unspecified: Secondary | ICD-10-CM | POA: Insufficient documentation

## 2017-02-18 DIAGNOSIS — K2980 Duodenitis without bleeding: Secondary | ICD-10-CM | POA: Insufficient documentation

## 2017-02-18 DIAGNOSIS — N189 Chronic kidney disease, unspecified: Secondary | ICD-10-CM | POA: Diagnosis not present

## 2017-02-18 DIAGNOSIS — E785 Hyperlipidemia, unspecified: Secondary | ICD-10-CM | POA: Diagnosis not present

## 2017-02-18 DIAGNOSIS — F419 Anxiety disorder, unspecified: Secondary | ICD-10-CM | POA: Insufficient documentation

## 2017-02-18 DIAGNOSIS — I252 Old myocardial infarction: Secondary | ICD-10-CM | POA: Insufficient documentation

## 2017-02-18 DIAGNOSIS — K449 Diaphragmatic hernia without obstruction or gangrene: Secondary | ICD-10-CM | POA: Diagnosis not present

## 2017-02-18 DIAGNOSIS — I129 Hypertensive chronic kidney disease with stage 1 through stage 4 chronic kidney disease, or unspecified chronic kidney disease: Secondary | ICD-10-CM | POA: Insufficient documentation

## 2017-02-18 DIAGNOSIS — R5382 Chronic fatigue, unspecified: Secondary | ICD-10-CM | POA: Insufficient documentation

## 2017-02-18 DIAGNOSIS — K2270 Barrett's esophagus without dysplasia: Secondary | ICD-10-CM | POA: Diagnosis present

## 2017-02-18 DIAGNOSIS — G20 Parkinson's disease: Secondary | ICD-10-CM | POA: Insufficient documentation

## 2017-02-18 DIAGNOSIS — Z7982 Long term (current) use of aspirin: Secondary | ICD-10-CM | POA: Diagnosis not present

## 2017-02-18 DIAGNOSIS — Z87442 Personal history of urinary calculi: Secondary | ICD-10-CM | POA: Insufficient documentation

## 2017-02-18 DIAGNOSIS — K2950 Unspecified chronic gastritis without bleeding: Secondary | ICD-10-CM | POA: Diagnosis not present

## 2017-02-18 HISTORY — DX: Parkinson's disease without dyskinesia, without mention of fluctuations: G20.A1

## 2017-02-18 HISTORY — DX: Chronic kidney disease, unspecified: N18.9

## 2017-02-18 HISTORY — PX: ESOPHAGOGASTRODUODENOSCOPY (EGD) WITH PROPOFOL: SHX5813

## 2017-02-18 HISTORY — DX: Parkinson's disease: G20

## 2017-02-18 HISTORY — DX: Diverticulosis of intestine, part unspecified, without perforation or abscess without bleeding: K57.90

## 2017-02-18 HISTORY — DX: Malignant (primary) neoplasm, unspecified: C80.1

## 2017-02-18 HISTORY — DX: Abnormal weight loss: R63.4

## 2017-02-18 HISTORY — DX: Unspecified osteoarthritis, unspecified site: M19.90

## 2017-02-18 LAB — PROTIME-INR
INR: 1.11
PROTHROMBIN TIME: 14.2 s (ref 11.4–15.2)

## 2017-02-18 LAB — CBC WITH DIFFERENTIAL/PLATELET
BASOS PCT: 2 %
Basophils Absolute: 0.1 10*3/uL (ref 0–0.1)
EOS ABS: 0.2 10*3/uL (ref 0–0.7)
EOS PCT: 3 %
HCT: 48.2 % (ref 40.0–52.0)
Hemoglobin: 16.6 g/dL (ref 13.0–18.0)
Lymphocytes Relative: 27 %
Lymphs Abs: 1.8 10*3/uL (ref 1.0–3.6)
MCH: 34.6 pg — ABNORMAL HIGH (ref 26.0–34.0)
MCHC: 34.4 g/dL (ref 32.0–36.0)
MCV: 100.4 fL — ABNORMAL HIGH (ref 80.0–100.0)
MONO ABS: 0.5 10*3/uL (ref 0.2–1.0)
MONOS PCT: 8 %
Neutro Abs: 4.1 10*3/uL (ref 1.4–6.5)
Neutrophils Relative %: 60 %
Platelets: 95 10*3/uL — ABNORMAL LOW (ref 150–440)
RBC: 4.81 MIL/uL (ref 4.40–5.90)
RDW: 13.9 % (ref 11.5–14.5)
WBC: 6.7 10*3/uL (ref 3.8–10.6)

## 2017-02-18 SURGERY — ESOPHAGOGASTRODUODENOSCOPY (EGD) WITH PROPOFOL
Anesthesia: General

## 2017-02-18 MED ORDER — FENTANYL CITRATE (PF) 100 MCG/2ML IJ SOLN
INTRAMUSCULAR | Status: AC
  Filled 2017-02-18: qty 2

## 2017-02-18 MED ORDER — SODIUM CHLORIDE 0.9 % IV SOLN
INTRAVENOUS | Status: DC
  Administered 2017-02-18: 09:00:00 via INTRAVENOUS

## 2017-02-18 MED ORDER — EPHEDRINE SULFATE 50 MG/ML IJ SOLN
INTRAMUSCULAR | Status: AC
  Filled 2017-02-18: qty 1

## 2017-02-18 MED ORDER — SODIUM CHLORIDE 0.9 % IV SOLN: INTRAVENOUS | Status: DC

## 2017-02-18 MED ORDER — LIDOCAINE HCL (CARDIAC) 20 MG/ML IV SOLN
INTRAVENOUS | Status: DC | PRN
  Administered 2017-02-18: 50 mg via INTRAVENOUS

## 2017-02-18 MED ORDER — FENTANYL CITRATE (PF) 100 MCG/2ML IJ SOLN
INTRAMUSCULAR | Status: DC | PRN
  Administered 2017-02-18: 50 ug via INTRAVENOUS

## 2017-02-18 MED ORDER — LIDOCAINE HCL (PF) 2 % IJ SOLN
INTRAMUSCULAR | Status: AC
  Filled 2017-02-18: qty 10

## 2017-02-18 MED ORDER — PROPOFOL 500 MG/50ML IV EMUL
INTRAVENOUS | Status: AC
  Filled 2017-02-18: qty 50

## 2017-02-18 MED ORDER — EPHEDRINE SULFATE 50 MG/ML IJ SOLN
INTRAMUSCULAR | Status: DC | PRN
  Administered 2017-02-18 (×2): 10 mg via INTRAVENOUS

## 2017-02-18 MED ORDER — PROPOFOL 500 MG/50ML IV EMUL
INTRAVENOUS | Status: DC | PRN
  Administered 2017-02-18: 100 ug/kg/min via INTRAVENOUS

## 2017-02-18 NOTE — H&P (Signed)
Outpatient short stay form Pre-procedure 02/18/2017 8:53 AM Douglas Sails MD  Primary Physician: Dr. Fulton Reek  Reason for visit:  EGD  History of present illness:  Patient is a 76 year old male presenting today as above. He has a personal history of Barrett's esophagus with his last procedure being done about a year ago at that time for weight loss. This showed some erosive gastritis and duodenitis. There was evidence of some inflamed intestinal metaplasia of the GE junction. He has been on proton pump inhibitor. He is presenting today for recheck. He does take 81 mg aspirin daily but takes no other aspirin products or blood thinning agents.  Due to his history of thrombocytopenia a CBC was obtained today this showing platelet count of 95. His absolute neutrophil count was 4.1. A ProTime was done showing 14.2 with INR 1.11.      Current Facility-Administered Medications:  .  0.9 %  sodium chloride infusion, , Intravenous, Continuous, Douglas Sails, MD .  0.9 %  sodium chloride infusion, , Intravenous, Continuous, Douglas Sails, MD  Medications Prior to Admission  Medication Sig Dispense Refill Last Dose  . amitriptyline (ELAVIL) 25 MG tablet Take 1 tablet by mouth daily.   02/17/2017 at Unknown time  . Apoaequorin (PREVAGEN) 10 MG CAPS Take 1 capsule by mouth daily.   02/17/2017 at Unknown time  . aspirin 81 MG chewable tablet Chew 81 mg by mouth daily.   Past Week at Unknown time  . carbidopa-levodopa (SINEMET IR) 25-100 MG tablet Take 1 tablet by mouth 3 (three) times daily. 90 tablet 6 02/17/2017 at Unknown time  . carvedilol (COREG) 12.5 MG tablet Take 1 tablet (12.5 mg total) by mouth 2 (two) times daily with a meal. 60 tablet 10 02/18/2017 at 0615  . cholecalciferol (VITAMIN D) 1000 UNITS tablet Take 2,000 Units by mouth daily.   02/17/2017 at Unknown time  . clonazePAM (KLONOPIN) 2 MG tablet Take 2 mg by mouth at bedtime.   02/17/2017 at Unknown time  .  donepezil (ARICEPT) 5 MG tablet Take 5 mg by mouth at bedtime.   02/17/2017 at Unknown time  . Multiple Vitamin (MULTI-VITAMINS) TABS Take 1 tablet by mouth daily.   02/17/2017 at Unknown time  . pantoprazole (PROTONIX) 40 MG tablet Take 1 tablet by mouth daily.   02/17/2017 at Unknown time  . simvastatin (ZOCOR) 20 MG tablet Take 20 mg by mouth daily.   02/17/2017 at Unknown time  . tamsulosin (FLOMAX) 0.4 MG CAPS capsule Take 1 capsule by mouth daily.   02/17/2017 at Unknown time  . vitamin B-12 (CYANOCOBALAMIN) 1000 MCG tablet Take 2 tablets by mouth daily.   02/17/2017 at Unknown time  . Wheat Dextrin (BENEFIBER DRINK MIX PO) Take 5 mLs by mouth daily.   02/17/2017 at Unknown time  . acetaminophen (TYLENOL) 500 MG tablet Take 500 mg by mouth every 6 (six) hours as needed. pain   Taking  . esomeprazole (NEXIUM) 20 MG capsule Take 20 mg by mouth daily.   Taking  . HYDROcodone-acetaminophen (NORCO/VICODIN) 5-325 MG per tablet Take 1 tablet by mouth every 6 (six) hours as needed for moderate pain.   Taking  . losartan (COZAAR) 50 MG tablet Take by mouth.   Taking  . mirtazapine (REMERON) 30 MG tablet Take 30 mg by mouth at bedtime.   Taking  . nitroGLYCERIN (NITROSTAT) 0.4 MG SL tablet Place 1 tablet (0.4 mg total) under the tongue every 5 (five) minutes as needed for  chest pain. 25 tablet 3 Taking     Allergies  Allergen Reactions  . Donepezil   . Doxazosin Hives  . Trazodone And Nefazodone Other (See Comments)    shivers  . Hctz [Hydrochlorothiazide] Rash    blisters     Past Medical History:  Diagnosis Date  . Anxiety   . Arthritis   . BBB (bundle branch block)    RIGHT AND LEFT  . Cancer [facility])    Prostate Cancer  . Chronic fatigue and malaise    with STM loss  . Chronic kidney disease    stone  . Concussion 1993   with long term memory loss  . Coronary artery disease   . Diverticulosis   . Emphysema lung ([place])   . GERD (gastroesophageal reflux disease)   . HH (hiatus  hernia)   . History of kidney stones   . Hyperlipidemia   . Hypertension   . Myocardial infarction ([place])   . Parkinson disease ([place])   . Weight loss     Review of systems:      Physical Exam    Heart and lungs: Regular rate and rhythm without rub or gallop, lungs are bilaterally clear.    HEENT: Normocephalic atraumatic eyes are anicteric    Other:     Pertinant exam for procedure: Soft nontender nondistended bowel sounds positive normoactive.    Planned proceedures: EGD and indicated procedures. I have discussed the risks benefits and complications of procedures to include not limited to bleeding, infection, perforation and the risk of sedation and the patient wishes to proceed.    Douglas Sails, MD Gastroenterology 02/18/2017  8:53 AM

## 2017-02-18 NOTE — Anesthesia Postprocedure Evaluation (Signed)
Anesthesia Post Note  Patient: Douglas Clark [facility]  Procedure(s) Performed: ESOPHAGOGASTRODUODENOSCOPY (EGD) WITH PROPOFOL (N/A )  Patient location during evaluation: Endoscopy Anesthesia Type: General Level of consciousness: awake and alert Pain management: pain level controlled Vital Signs Assessment: post-procedure vital signs reviewed and stable Respiratory status: spontaneous breathing and respiratory function stable Cardiovascular status: stable Anesthetic complications: no     Last Vitals:  Vitals:   02/18/17 0946 02/18/17 0950  BP: 109/60   Pulse: 63 (!) 55  Resp: 15 18  Temp:    SpO2: 97% 95%    Last Pain:  Vitals:   02/18/17 0724  TempSrc: Oral                 Douglas Clark K

## 2017-02-18 NOTE — Transfer of Care (Signed)
Immediate Anesthesia Transfer of Care Note  Patient: Douglas Clark  Procedure(s) Performed: ESOPHAGOGASTRODUODENOSCOPY (EGD) WITH PROPOFOL (N/A )  Patient Location: PACU  Anesthesia Type:General  Level of Consciousness: awake and sedated  Airway & Oxygen Therapy: Patient Spontanous Breathing and Patient connected to nasal cannula oxygen  Post-op Assessment: Report given to RN and Post -op Vital signs reviewed and stable  Post vital signs: Reviewed and stable  Last Vitals:  Vitals:   02/18/17 0724  BP: 122/65  Pulse: (!) 58  Resp: 18  Temp: (!) 36.1 C  SpO2: 99%    Last Pain:  Vitals:   02/18/17 0724  TempSrc: Oral         Complications: No apparent anesthesia complications

## 2017-02-18 NOTE — Anesthesia Procedure Notes (Signed)
Performed by: Cook-Martin, Danice Dippolito Pre-anesthesia Checklist: Patient identified, Emergency Drugs available, Suction available, Patient being monitored and Timeout performed Patient Re-evaluated:Patient Re-evaluated prior to induction Oxygen Delivery Method: Nasal cannula Preoxygenation: Pre-oxygenation with 100% oxygen Induction Type: IV induction Airway Equipment and Method: Bite block Placement Confirmation: CO2 detector and positive ETCO2       

## 2017-02-18 NOTE — Anesthesia Post-op Follow-up Note (Signed)
Anesthesia QCDR form completed.        

## 2017-02-18 NOTE — Op Note (Signed)
[facility] Gastroenterology Patient Name: Douglas Clark Procedure Date: 02/18/2017 8:59 AM MRN: [id number] Account #: [id number] Date of Birth: 19-Sep-1940 Admit Type: Outpatient Age: 76 Room: [facility] ENDO ROOM 1 Gender: Male Note Status: Finalized Procedure:            Upper GI endoscopy Indications:          Follow-up of Barrett's esophagus Providers:            Lollie Sails, MD Referring MD:         Leonie Douglas. Doy Hutching, MD (Referring MD) Medicines:            Monitored Anesthesia Care Complications:        No immediate complications. Procedure:            Pre-Anesthesia Assessment:                       - ASA Grade Assessment: III - A patient with severe                        systemic disease.                       After obtaining informed consent, the endoscope was                        passed under direct vision. Throughout the procedure,                        the patient's blood pressure, pulse, and oxygen                        saturations were monitored continuously. The Endoscope                        was introduced through the mouth, and advanced to the                        third part of duodenum. The upper GI endoscopy was                        accomplished without difficulty. The patient tolerated                        the procedure well. Findings:      There were esophageal mucosal changes consistent with short-segment       Barrett's esophagus present in the lower third of the esophagus. The       maximum longitudinal extent of these mucosal changes was 2 cm in length.       Mucosa was biopsied with a cold forceps for histology in 4 quadrants.       One specimen bottle was sent to pathology. No evidence of esophageal or       gastric varices.      Diffuse mild inflammation characterized by congestion (edema), erythema       and linear erosions was found in the gastric body. Biopsies were taken       with a cold forceps for histology.  Biopsies were taken with a cold       forceps for Helicobacter pylori testing.      A medium-sized hiatal hernia with a few Lysbeth Galas  ulcers was found. The       Z-line was a variable distance from incisors; the hiatal hernia was       sliding.      Patchy mild inflammation characterized by erythema and granularity was       found in the first portion of the duodenum.      The cardia and gastric fundus were normal on retroflexion otherwise. Impression:           - Esophageal mucosal changes consistent with                        short-segment Barrett's esophagus. Biopsied.                       - Erosive gastritis. Biopsied.                       - Medium-sized hiatal hernia with a few Cameron ulcers.                       - Duodenitis. Recommendation:       - Await pathology results.                       - Continue present medications. Procedure Code(s):    --- Professional ---                       [phone], Esophagogastroduodenoscopy, flexible, transoral;                        with biopsy, single or multiple Diagnosis Code(s):    --- Professional ---                       K22.70, Barrett's esophagus without dysplasia                       K29.60, Other gastritis without bleeding                       K44.9, Diaphragmatic hernia without obstruction or                        gangrene                       K25.9, Gastric ulcer, unspecified as acute or chronic,                        without hemorrhage or perforation                       K29.80, Duodenitis without bleeding CPT copyright 2016 American Medical Association. All rights reserved. The codes documented in this report are preliminary and upon coder review may  be revised to meet current compliance requirements. Lollie Sails, MD 02/18/2017 9:26:06 AM This report has been signed electronically. Number of Addenda: 0 Note Initiated On: 02/18/2017 8:59 AM      [facility]

## 2017-02-18 NOTE — Anesthesia Preprocedure Evaluation (Signed)
Anesthesia Evaluation  Patient identified by MRN, date of birth, ID band Patient awake    Reviewed: Allergy & Precautions, NPO status , Patient's Chart, lab work & pertinent test results  History of Anesthesia Complications Negative for: history of anesthetic complications  Airway Mallampati: II       Dental   Pulmonary neg sleep apnea, COPD,  COPD inhaler, Current Smoker,           Cardiovascular hypertension, Pt. on medications and Pt. on home beta blockers + CAD, + Past MI and + CABG  (-) CHF (-) dysrhythmias (-) Valvular Problems/Murmurs     Neuro/Psych neg Seizures Anxiety    GI/Hepatic Neg liver ROS, hiatal hernia, GERD  Medicated and Controlled,  Endo/Other  neg diabetes  Renal/GU Renal InsufficiencyRenal disease     Musculoskeletal   Abdominal   Peds  Hematology   Anesthesia Other Findings   Reproductive/Obstetrics                             Anesthesia Physical Anesthesia Plan  ASA: III  Anesthesia Plan: General   Post-op Pain Management:    Induction: Intravenous  PONV Risk Score and Plan: 1 and TIVA and Propofol infusion  Airway Management Planned: Nasal Cannula  Additional Equipment:   Intra-op Plan:   Post-operative Plan:   Informed Consent: I have reviewed the patients History and Physical, chart, labs and discussed the procedure including the risks, benefits and alternatives for the proposed anesthesia with the patient or authorized representative who has indicated his/her understanding and acceptance.     Plan Discussed with:   Anesthesia Plan Comments:         Anesthesia Quick Evaluation

## 2017-02-21 ENCOUNTER — Ambulatory Visit
Admission: RE | Admit: 2017-02-21 | Discharge: 2017-02-21 | Disposition: A | Discharge status: Home / Self Care | Payer: Medicare Other | Source: Ambulatory Visit | Attending: Radiation Oncology | Admitting: Radiation Oncology

## 2017-02-21 ENCOUNTER — Encounter: Payer: Self-pay | Admitting: Gastroenterology

## 2017-02-22 ENCOUNTER — Ambulatory Visit
Admission: RE | Admit: 2017-02-22 | Discharge: 2017-02-22 | Disposition: A | Discharge status: Home / Self Care | Payer: Medicare Other | Source: Ambulatory Visit | Attending: Radiation Oncology | Admitting: Radiation Oncology

## 2017-02-22 DIAGNOSIS — Z510 Encounter for antineoplastic radiation therapy: Secondary | ICD-10-CM | POA: Diagnosis not present

## 2017-02-23 ENCOUNTER — Ambulatory Visit
Admission: RE | Admit: 2017-02-23 | Discharge: 2017-02-23 | Disposition: A | Discharge status: Home / Self Care | Payer: Medicare Other | Source: Ambulatory Visit | Attending: Radiation Oncology | Admitting: Radiation Oncology

## 2017-02-23 DIAGNOSIS — Z510 Encounter for antineoplastic radiation therapy: Secondary | ICD-10-CM | POA: Diagnosis not present

## 2017-02-24 ENCOUNTER — Ambulatory Visit
Admission: RE | Admit: 2017-02-24 | Discharge: 2017-02-24 | Disposition: A | Discharge status: Home / Self Care | Payer: Medicare Other | Source: Ambulatory Visit | Attending: Radiation Oncology | Admitting: Radiation Oncology

## 2017-02-24 DIAGNOSIS — Z510 Encounter for antineoplastic radiation therapy: Secondary | ICD-10-CM | POA: Diagnosis not present

## 2017-02-25 ENCOUNTER — Ambulatory Visit
Admission: RE | Admit: 2017-02-25 | Discharge: 2017-02-25 | Disposition: A | Discharge status: Home / Self Care | Payer: Medicare Other | Source: Ambulatory Visit | Attending: Radiation Oncology | Admitting: Radiation Oncology

## 2017-02-25 DIAGNOSIS — Z510 Encounter for antineoplastic radiation therapy: Secondary | ICD-10-CM | POA: Diagnosis not present

## 2017-02-28 ENCOUNTER — Ambulatory Visit: Payer: Medicare Other

## 2017-03-01 ENCOUNTER — Ambulatory Visit
Admission: RE | Admit: 2017-03-01 | Discharge: 2017-03-01 | Disposition: A | Discharge status: Home / Self Care | Payer: Medicare Other | Source: Ambulatory Visit | Attending: Radiation Oncology | Admitting: Radiation Oncology

## 2017-03-01 DIAGNOSIS — Z510 Encounter for antineoplastic radiation therapy: Secondary | ICD-10-CM | POA: Diagnosis not present

## 2017-03-02 ENCOUNTER — Ambulatory Visit
Admission: RE | Admit: 2017-03-02 | Discharge: 2017-03-02 | Disposition: A | Discharge status: Home / Self Care | Payer: Medicare Other | Source: Ambulatory Visit | Attending: Radiation Oncology | Admitting: Radiation Oncology

## 2017-03-02 DIAGNOSIS — Z510 Encounter for antineoplastic radiation therapy: Secondary | ICD-10-CM | POA: Diagnosis not present

## 2017-03-03 ENCOUNTER — Ambulatory Visit
Admission: RE | Admit: 2017-03-03 | Discharge: 2017-03-03 | Disposition: A | Discharge status: Home / Self Care | Payer: Medicare Other | Source: Ambulatory Visit | Attending: Radiation Oncology | Admitting: Radiation Oncology

## 2017-03-03 DIAGNOSIS — Z510 Encounter for antineoplastic radiation therapy: Secondary | ICD-10-CM | POA: Diagnosis not present

## 2017-03-03 LAB — SURGICAL PATHOLOGY

## 2017-03-04 ENCOUNTER — Ambulatory Visit
Admission: RE | Admit: 2017-03-04 | Discharge: 2017-03-04 | Disposition: A | Discharge status: Home / Self Care | Payer: Medicare Other | Source: Ambulatory Visit | Attending: Radiation Oncology | Admitting: Radiation Oncology

## 2017-03-04 DIAGNOSIS — Z510 Encounter for antineoplastic radiation therapy: Secondary | ICD-10-CM | POA: Diagnosis not present

## 2017-03-07 ENCOUNTER — Ambulatory Visit
Admission: RE | Admit: 2017-03-07 | Discharge: 2017-03-07 | Disposition: A | Discharge status: Home / Self Care | Payer: Medicare Other | Source: Ambulatory Visit | Attending: Radiation Oncology | Admitting: Radiation Oncology

## 2017-03-07 DIAGNOSIS — Z510 Encounter for antineoplastic radiation therapy: Secondary | ICD-10-CM | POA: Diagnosis not present

## 2017-03-08 ENCOUNTER — Ambulatory Visit
Admission: RE | Admit: 2017-03-08 | Discharge: 2017-03-08 | Disposition: A | Discharge status: Home / Self Care | Payer: Medicare Other | Source: Ambulatory Visit | Attending: Radiation Oncology | Admitting: Radiation Oncology

## 2017-03-08 DIAGNOSIS — Z510 Encounter for antineoplastic radiation therapy: Secondary | ICD-10-CM | POA: Diagnosis not present

## 2017-03-09 ENCOUNTER — Ambulatory Visit
Admission: RE | Admit: 2017-03-09 | Discharge: 2017-03-09 | Disposition: A | Discharge status: Home / Self Care | Payer: Medicare Other | Source: Ambulatory Visit | Attending: Radiation Oncology | Admitting: Radiation Oncology

## 2017-03-09 DIAGNOSIS — Z510 Encounter for antineoplastic radiation therapy: Secondary | ICD-10-CM | POA: Diagnosis not present

## 2017-03-10 ENCOUNTER — Inpatient Hospital Stay: Payer: Medicare Other

## 2017-03-10 ENCOUNTER — Ambulatory Visit
Admission: RE | Admit: 2017-03-10 | Discharge: 2017-03-10 | Disposition: A | Discharge status: Home / Self Care | Payer: Medicare Other | Source: Ambulatory Visit | Attending: Radiation Oncology | Admitting: Radiation Oncology

## 2017-03-10 DIAGNOSIS — Z510 Encounter for antineoplastic radiation therapy: Secondary | ICD-10-CM | POA: Diagnosis not present

## 2017-03-11 ENCOUNTER — Ambulatory Visit
Admission: RE | Admit: 2017-03-11 | Discharge: 2017-03-11 | Disposition: A | Discharge status: Home / Self Care | Payer: Medicare Other | Source: Ambulatory Visit | Attending: Radiation Oncology | Admitting: Radiation Oncology

## 2017-03-11 DIAGNOSIS — Z510 Encounter for antineoplastic radiation therapy: Secondary | ICD-10-CM | POA: Diagnosis not present

## 2017-03-14 ENCOUNTER — Ambulatory Visit
Admission: RE | Admit: 2017-03-14 | Discharge: 2017-03-14 | Disposition: A | Discharge status: Home / Self Care | Payer: Medicare Other | Source: Ambulatory Visit | Attending: Radiation Oncology | Admitting: Radiation Oncology

## 2017-03-14 DIAGNOSIS — Z510 Encounter for antineoplastic radiation therapy: Secondary | ICD-10-CM | POA: Diagnosis not present

## 2017-03-16 ENCOUNTER — Ambulatory Visit
Admission: RE | Admit: 2017-03-16 | Discharge: 2017-03-16 | Disposition: A | Discharge status: Home / Self Care | Payer: Medicare Other | Source: Ambulatory Visit | Attending: Radiation Oncology | Admitting: Radiation Oncology

## 2017-03-16 DIAGNOSIS — Z510 Encounter for antineoplastic radiation therapy: Secondary | ICD-10-CM | POA: Diagnosis not present

## 2017-03-17 ENCOUNTER — Ambulatory Visit
Admission: RE | Admit: 2017-03-17 | Discharge: 2017-03-17 | Disposition: A | Discharge status: Home / Self Care | Payer: Medicare Other | Source: Ambulatory Visit | Attending: Radiation Oncology | Admitting: Radiation Oncology

## 2017-03-17 DIAGNOSIS — Z510 Encounter for antineoplastic radiation therapy: Secondary | ICD-10-CM | POA: Diagnosis not present

## 2017-03-18 ENCOUNTER — Ambulatory Visit: Payer: Medicare Other

## 2017-03-21 ENCOUNTER — Ambulatory Visit: Payer: Medicare Other

## 2017-03-21 ENCOUNTER — Ambulatory Visit
Admission: RE | Admit: 2017-03-21 | Discharge: 2017-03-21 | Disposition: A | Discharge status: Home / Self Care | Payer: Medicare Other | Source: Ambulatory Visit | Attending: Radiation Oncology | Admitting: Radiation Oncology

## 2017-03-21 DIAGNOSIS — Z510 Encounter for antineoplastic radiation therapy: Secondary | ICD-10-CM | POA: Diagnosis not present

## 2017-03-22 DIAGNOSIS — N2889 Other specified disorders of kidney and ureter: Secondary | ICD-10-CM | POA: Diagnosis not present

## 2017-03-22 DIAGNOSIS — I10 Essential (primary) hypertension: Secondary | ICD-10-CM | POA: Diagnosis not present

## 2017-03-22 DIAGNOSIS — C61 Malignant neoplasm of prostate: Secondary | ICD-10-CM | POA: Diagnosis not present

## 2017-03-22 DIAGNOSIS — F419 Anxiety disorder, unspecified: Secondary | ICD-10-CM | POA: Diagnosis not present

## 2017-03-22 DIAGNOSIS — Z79899 Other long term (current) drug therapy: Secondary | ICD-10-CM | POA: Diagnosis not present

## 2017-03-22 DIAGNOSIS — G20 Parkinson's disease: Secondary | ICD-10-CM | POA: Diagnosis not present

## 2017-03-22 DIAGNOSIS — J439 Emphysema, unspecified: Secondary | ICD-10-CM | POA: Diagnosis not present

## 2017-03-22 DIAGNOSIS — K219 Gastro-esophageal reflux disease without esophagitis: Secondary | ICD-10-CM | POA: Diagnosis not present

## 2017-03-22 DIAGNOSIS — K5900 Constipation, unspecified: Secondary | ICD-10-CM | POA: Diagnosis not present

## 2017-03-22 DIAGNOSIS — E785 Hyperlipidemia, unspecified: Secondary | ICD-10-CM | POA: Diagnosis not present

## 2017-03-22 DIAGNOSIS — I2510 Atherosclerotic heart disease of native coronary artery without angina pectoris: Secondary | ICD-10-CM | POA: Diagnosis not present

## 2017-03-22 DIAGNOSIS — F17210 Nicotine dependence, cigarettes, uncomplicated: Secondary | ICD-10-CM | POA: Diagnosis not present

## 2017-03-22 DIAGNOSIS — R5383 Other fatigue: Secondary | ICD-10-CM | POA: Diagnosis not present

## 2017-03-22 DIAGNOSIS — Z87442 Personal history of urinary calculi: Secondary | ICD-10-CM | POA: Diagnosis not present

## 2017-03-22 DIAGNOSIS — K449 Diaphragmatic hernia without obstruction or gangrene: Secondary | ICD-10-CM | POA: Diagnosis not present

## 2017-03-22 DIAGNOSIS — Z510 Encounter for antineoplastic radiation therapy: Secondary | ICD-10-CM | POA: Diagnosis present

## 2017-03-22 DIAGNOSIS — R531 Weakness: Secondary | ICD-10-CM | POA: Diagnosis not present

## 2017-03-22 DIAGNOSIS — I252 Old myocardial infarction: Secondary | ICD-10-CM | POA: Diagnosis not present

## 2017-03-22 DIAGNOSIS — Z923 Personal history of irradiation: Secondary | ICD-10-CM | POA: Diagnosis not present

## 2017-03-22 DIAGNOSIS — I454 Nonspecific intraventricular block: Secondary | ICD-10-CM | POA: Diagnosis not present

## 2017-03-22 DIAGNOSIS — Z7982 Long term (current) use of aspirin: Secondary | ICD-10-CM | POA: Diagnosis not present

## 2017-03-23 ENCOUNTER — Ambulatory Visit
Admission: RE | Admit: 2017-03-23 | Discharge: 2017-03-23 | Disposition: A | Discharge status: Home / Self Care | Payer: Medicare Other | Source: Ambulatory Visit | Attending: Radiation Oncology | Admitting: Radiation Oncology

## 2017-03-23 ENCOUNTER — Ambulatory Visit: Payer: Medicare Other

## 2017-03-23 DIAGNOSIS — Z510 Encounter for antineoplastic radiation therapy: Secondary | ICD-10-CM | POA: Diagnosis not present

## 2017-03-24 ENCOUNTER — Inpatient Hospital Stay: Payer: Medicare Other | Attending: Radiation Oncology

## 2017-03-24 ENCOUNTER — Ambulatory Visit
Admission: RE | Admit: 2017-03-24 | Discharge: 2017-03-24 | Disposition: A | Discharge status: Home / Self Care | Payer: Medicare Other | Source: Ambulatory Visit | Attending: Radiation Oncology | Admitting: Radiation Oncology

## 2017-03-24 DIAGNOSIS — Z79818 Long term (current) use of other agents affecting estrogen receptors and estrogen levels: Secondary | ICD-10-CM | POA: Insufficient documentation

## 2017-03-24 DIAGNOSIS — Z510 Encounter for antineoplastic radiation therapy: Secondary | ICD-10-CM | POA: Diagnosis not present

## 2017-03-24 DIAGNOSIS — C61 Malignant neoplasm of prostate: Secondary | ICD-10-CM | POA: Insufficient documentation

## 2017-03-25 ENCOUNTER — Ambulatory Visit
Admission: RE | Admit: 2017-03-25 | Discharge: 2017-03-25 | Disposition: A | Discharge status: Home / Self Care | Payer: Medicare Other | Source: Ambulatory Visit | Attending: Radiation Oncology | Admitting: Radiation Oncology

## 2017-03-25 DIAGNOSIS — Z510 Encounter for antineoplastic radiation therapy: Secondary | ICD-10-CM | POA: Diagnosis not present

## 2017-03-28 ENCOUNTER — Ambulatory Visit
Admission: RE | Admit: 2017-03-28 | Discharge: 2017-03-28 | Disposition: A | Discharge status: Home / Self Care | Payer: Medicare Other | Source: Ambulatory Visit | Attending: Radiation Oncology | Admitting: Radiation Oncology

## 2017-03-28 DIAGNOSIS — Z510 Encounter for antineoplastic radiation therapy: Secondary | ICD-10-CM | POA: Diagnosis not present

## 2017-03-29 ENCOUNTER — Ambulatory Visit
Admission: RE | Admit: 2017-03-29 | Discharge: 2017-03-29 | Disposition: A | Discharge status: Home / Self Care | Payer: Medicare Other | Source: Ambulatory Visit | Attending: Radiation Oncology | Admitting: Radiation Oncology

## 2017-03-29 ENCOUNTER — Other Ambulatory Visit: Payer: Self-pay | Admitting: *Deleted

## 2017-03-29 DIAGNOSIS — Z510 Encounter for antineoplastic radiation therapy: Secondary | ICD-10-CM | POA: Diagnosis not present

## 2017-03-29 MED ORDER — SUCRALFATE 1 G PO TABS: 1.0000 g | ORAL_TABLET | Freq: Three times a day (TID) | ORAL | 3 refills | Status: DC

## 2017-03-30 ENCOUNTER — Ambulatory Visit
Admission: RE | Admit: 2017-03-30 | Discharge: 2017-03-30 | Disposition: A | Discharge status: Home / Self Care | Payer: Medicare Other | Source: Ambulatory Visit | Attending: Radiation Oncology | Admitting: Radiation Oncology

## 2017-03-30 DIAGNOSIS — Z510 Encounter for antineoplastic radiation therapy: Secondary | ICD-10-CM | POA: Diagnosis not present

## 2017-03-31 ENCOUNTER — Inpatient Hospital Stay: Payer: Medicare Other

## 2017-03-31 ENCOUNTER — Ambulatory Visit
Admission: RE | Admit: 2017-03-31 | Discharge: 2017-03-31 | Disposition: A | Discharge status: Home / Self Care | Payer: Medicare Other | Source: Ambulatory Visit | Attending: Radiation Oncology | Admitting: Radiation Oncology

## 2017-03-31 DIAGNOSIS — Z79818 Long term (current) use of other agents affecting estrogen receptors and estrogen levels: Secondary | ICD-10-CM | POA: Diagnosis not present

## 2017-03-31 DIAGNOSIS — C61 Malignant neoplasm of prostate: Secondary | ICD-10-CM | POA: Diagnosis not present

## 2017-03-31 DIAGNOSIS — Z510 Encounter for antineoplastic radiation therapy: Secondary | ICD-10-CM | POA: Diagnosis not present

## 2017-03-31 LAB — CBC
HEMATOCRIT: 49.9 % (ref 40.0–52.0)
HEMOGLOBIN: 17.1 g/dL (ref 13.0–18.0)
MCH: 34.3 pg — ABNORMAL HIGH (ref 26.0–34.0)
MCHC: 34.2 g/dL (ref 32.0–36.0)
MCV: 100.3 fL — ABNORMAL HIGH (ref 80.0–100.0)
Platelets: 109 10*3/uL — ABNORMAL LOW (ref 150–440)
RBC: 4.98 MIL/uL (ref 4.40–5.90)
RDW: 14.2 % (ref 11.5–14.5)
WBC: 6.2 10*3/uL (ref 3.8–10.6)

## 2017-04-01 ENCOUNTER — Ambulatory Visit
Admission: RE | Admit: 2017-04-01 | Discharge: 2017-04-01 | Disposition: A | Discharge status: Home / Self Care | Payer: Medicare Other | Source: Ambulatory Visit | Attending: Radiation Oncology | Admitting: Radiation Oncology

## 2017-04-01 DIAGNOSIS — Z510 Encounter for antineoplastic radiation therapy: Secondary | ICD-10-CM | POA: Diagnosis not present

## 2017-04-04 ENCOUNTER — Ambulatory Visit: Payer: Medicare Other

## 2017-04-05 ENCOUNTER — Ambulatory Visit: Payer: Medicare Other

## 2017-04-05 ENCOUNTER — Ambulatory Visit: Payer: Medicare Other | Admitting: Urology

## 2017-04-06 ENCOUNTER — Ambulatory Visit
Admission: RE | Admit: 2017-04-06 | Discharge: 2017-04-06 | Disposition: A | Discharge status: Home / Self Care | Payer: Medicare Other | Source: Ambulatory Visit | Attending: Radiation Oncology | Admitting: Radiation Oncology

## 2017-04-06 DIAGNOSIS — Z510 Encounter for antineoplastic radiation therapy: Secondary | ICD-10-CM | POA: Diagnosis not present

## 2017-04-07 ENCOUNTER — Ambulatory Visit: Payer: Medicare Other

## 2017-04-07 ENCOUNTER — Inpatient Hospital Stay: Payer: Medicare Other

## 2017-04-08 ENCOUNTER — Ambulatory Visit
Admission: RE | Admit: 2017-04-08 | Discharge: 2017-04-08 | Disposition: A | Discharge status: Home / Self Care | Payer: Medicare Other | Source: Ambulatory Visit | Attending: Radiation Oncology | Admitting: Radiation Oncology

## 2017-04-08 DIAGNOSIS — Z510 Encounter for antineoplastic radiation therapy: Secondary | ICD-10-CM | POA: Diagnosis not present

## 2017-04-11 ENCOUNTER — Ambulatory Visit
Admission: RE | Admit: 2017-04-11 | Discharge: 2017-04-11 | Disposition: A | Discharge status: Home / Self Care | Payer: Medicare Other | Source: Ambulatory Visit | Attending: Radiation Oncology | Admitting: Radiation Oncology

## 2017-04-11 DIAGNOSIS — Z510 Encounter for antineoplastic radiation therapy: Secondary | ICD-10-CM | POA: Diagnosis not present

## 2017-04-12 ENCOUNTER — Ambulatory Visit (INDEPENDENT_AMBULATORY_CARE_PROVIDER_SITE_OTHER): Payer: Medicare Other | Admitting: Urology

## 2017-04-12 ENCOUNTER — Ambulatory Visit
Admission: RE | Admit: 2017-04-12 | Discharge: 2017-04-12 | Disposition: A | Discharge status: Home / Self Care | Payer: Medicare Other | Source: Ambulatory Visit | Attending: Radiation Oncology | Admitting: Radiation Oncology

## 2017-04-12 ENCOUNTER — Encounter: Payer: Self-pay | Admitting: Urology

## 2017-04-12 VITALS — BP 153/92 | HR 80 | Ht 70.0 in | Wt 159.2 lb

## 2017-04-12 DIAGNOSIS — N138 Other obstructive and reflux uropathy: Secondary | ICD-10-CM

## 2017-04-12 DIAGNOSIS — N401 Enlarged prostate with lower urinary tract symptoms: Secondary | ICD-10-CM | POA: Diagnosis not present

## 2017-04-12 DIAGNOSIS — R310 Gross hematuria: Secondary | ICD-10-CM | POA: Diagnosis not present

## 2017-04-12 DIAGNOSIS — C61 Malignant neoplasm of prostate: Secondary | ICD-10-CM

## 2017-04-12 DIAGNOSIS — Z510 Encounter for antineoplastic radiation therapy: Secondary | ICD-10-CM | POA: Diagnosis not present

## 2017-04-12 LAB — URINALYSIS, COMPLETE
BILIRUBIN UA: NEGATIVE
Glucose, UA: NEGATIVE
Leukocytes, UA: NEGATIVE
NITRITE UA: NEGATIVE
PH UA: 5.5 (ref 5.0–7.5)
Specific Gravity, UA: >1.03 — ABNORMAL HIGH (ref 1.005–1.030)
UUROB: 0.2 mg/dL (ref 0.2–1.0)

## 2017-04-12 LAB — MICROSCOPIC EXAMINATION

## 2017-04-12 NOTE — Patient Instructions (Signed)

## 2017-04-12 NOTE — Progress Notes (Signed)
04/12/2017 4:12 PM   Douglas Clark [birth year] [id number]  Referring provider: Idelle Crouch, MD [place] [facility] [place], [place] [address]  Chief Complaint  Patient presents with  . Prostate Cancer    HPI: 77 year old male with prostate cancer currently undergoing IM RT.  He returns today to review his urinary symptoms primarily.  Prostate cancer Recently diagnosed with intermediate risk of high volume prostate cancer.  PSA 5.09 slowly rising over the past 2 years.  Rectal exam was abnormal bilaterally with a nodule at the apex.  Additionally, and a 1.5 cm nodular focus of enhancement in the peripheral zone there is no evidence of lymphadenopathy at that point.  Prostate biopsy revealed 12 of 12 cores positive for cancer, Gleason 3+4, high spine up to 99% of the tissue.  There was evidence of perineural invasion.  TRUS volume 33 g.  He is stents started and is nearly complete with external beam radiation.  He has been tolerating it relatively well although developed an episode of gross hematuria a few days ago.  This seems to be improving with hydration.  He is no longer passing blood or clots.  He is yet to receive any adjuvant androgen deprivation therapy.  BPH with urinary frequency and weak stream Chronic weak stream, difficulty starting stream. Urinary frequency with nocturia x 2-3. He also has urinary urgency with urgency with rare urge incontience and wears depends when he goes out for protection. This is unchanged today.  Underwent "heat shrinking" prostate procedure in 2005 which was ineffective.  Chronically on flomax.   Microscopic hematuria Review of records reveal multiple episodes of microscopic hematuria now previous evaluation. No gross hematuria or UTIs.  On chronic ASA 81 mg managed by Dr. Ubaldo Glassing.  He is a current smoker, 1 ppd  S/p cysto at the time of prostate biopsy without evidence of bladder pathology    Erectile  dysfunction Able to achieve but not maintain an erection. He reports that he loses interest.   History of kidney stones History of recurrent nephrolithiasis, multiple stone procedures including ESWL in the past.  Asymptomatic 12 mm left upper pole renal stone on most recent CT scan. No recent flank pain.  Renal cysts Multiple enlarging exophytic renal cyst, underwent further workup with MRI 6/2018of the abdomen characterized lesions as simple cyst. Largest cyst 1.3 cm on right kidney.    PMH: Past Medical History:  Diagnosis Date  . Anxiety   . Arthritis   . BBB (bundle branch block)    RIGHT AND LEFT  . Cancer [facility])    Prostate Cancer  . Chronic fatigue and malaise    with STM loss  . Chronic kidney disease    stone  . Concussion 1993   with long term memory loss  . Coronary artery disease   . Diverticulosis   . Emphysema lung ([place])   . GERD (gastroesophageal reflux disease)   . HH (hiatus hernia)   . History of kidney stones   . Hyperlipidemia   . Hypertension   . Myocardial infarction ([place])   . Parkinson disease ([place])   . Weight loss     Surgical History: Past Surgical History:  Procedure Laterality Date  . CARDIAC CATHETERIZATION    . COLONOSCOPY WITH PROPOFOL N/A 02/16/2016   Procedure: COLONOSCOPY WITH PROPOFOL;  Surgeon: Lollie Sails, MD;  Location: [facility] ENDOSCOPY;  Service: Endoscopy;  Laterality: N/A;  . CORONARY ARTERY BYPASS GRAFT     2000      .  ESOPHAGOGASTRODUODENOSCOPY (EGD) WITH PROPOFOL N/A 02/16/2016   Procedure: ESOPHAGOGASTRODUODENOSCOPY (EGD) WITH PROPOFOL;  Surgeon: Lollie Sails, MD;  Location: [facility] ENDOSCOPY;  Service: Endoscopy;  Laterality: N/A;  . ESOPHAGOGASTRODUODENOSCOPY (EGD) WITH PROPOFOL N/A 02/18/2017   Procedure: ESOPHAGOGASTRODUODENOSCOPY (EGD) WITH PROPOFOL;  Surgeon: Lollie Sails, MD;  [facility] ENDOSCOPY;  Service: Endoscopy;  Laterality: N/A;  . KIDNEY SURGERY     REMOVED STONE FROM RT KIDNEY  .  LITHOTRIPSY     X2   . LUMBAR LAMINECTOMY/DECOMPRESSION MICRODISCECTOMY Bilateral 02/04/2015   Procedure: LUMBAR LAMINECTOMY/DECOMPRESSION MICRODISCECTOMY  Bilateral - Lumbar two-three lumbar three-four lumbar four-five lumbar five sacral one ;  Surgeon: Leeroy Cha, MD;  Location: [facility] NEURO ORS;  Service: Neurosurgery;  Laterality: Bilateral;  . PILONIDAL CYST / SINUS EXCISION      Home Medications:  Allergies as of 04/12/2017      Reactions   Donepezil    Doxazosin Hives   Trazodone And Nefazodone Other (See Comments)   shivers   Hctz [hydrochlorothiazide] Rash   blisters      Medication List        Accurate as of 04/12/17 11:59 PM. Always use your most recent med list.          acetaminophen 500 MG tablet Commonly known as:  TYLENOL Take 500 mg by mouth every 6 (six) hours as needed. pain   amitriptyline 25 MG tablet Commonly known as:  ELAVIL Take 1 tablet by mouth daily.   aspirin 81 MG chewable tablet Chew 81 mg by mouth daily.   BENEFIBER DRINK MIX PO Take 5 mLs by mouth daily.   carbidopa-levodopa 25-100 MG tablet Commonly known as:  SINEMET IR Take 1 tablet by mouth 3 (three) times daily.   carvedilol 12.5 MG tablet Commonly known as:  COREG Take 1 tablet (12.5 mg total) by mouth 2 (two) times daily with a meal.   cholecalciferol 1000 units tablet Commonly known as:  VITAMIN D Take 2,000 Units by mouth daily.   clonazePAM 2 MG tablet Commonly known as:  KLONOPIN Take 2 mg by mouth at bedtime.   donepezil 5 MG tablet Commonly known as:  ARICEPT Take 5 mg by mouth at bedtime.   esomeprazole 20 MG capsule Commonly known as:  NEXIUM Take 20 mg by mouth daily.   HYDROcodone-acetaminophen 5-325 MG tablet Commonly known as:  NORCO/VICODIN Take 1 tablet by mouth every 6 (six) hours as needed for moderate pain.   losartan 50 MG tablet Commonly known as:  COZAAR Take by mouth.   mirtazapine 30 MG tablet Commonly known as:  REMERON Take 30 mg by  mouth at bedtime.   MULTI-VITAMINS Tabs Take 1 tablet by mouth daily.   nitroGLYCERIN 0.4 MG SL tablet Commonly known as:  NITROSTAT Place 1 tablet (0.4 mg total) under the tongue every 5 (five) minutes as needed for chest pain.   PREVAGEN 10 MG Caps Generic drug:  Apoaequorin Take 1 capsule by mouth daily.   simvastatin 20 MG tablet Commonly known as:  ZOCOR Take 20 mg by mouth daily.   sucralfate 1 g tablet Commonly known as:  CARAFATE Take 1 tablet (1 g total) by mouth 3 (three) times daily.   tamsulosin 0.4 MG Caps capsule Commonly known as:  FLOMAX Take 1 capsule by mouth daily.   vitamin B-12 1000 MCG tablet Commonly known as:  CYANOCOBALAMIN Take 2 tablets by mouth daily.       Allergies:  Allergies  Allergen Reactions  . Donepezil   .  Doxazosin Hives  . Trazodone And Nefazodone Other (See Comments)    shivers  . Hctz [Hydrochlorothiazide] Rash    blisters    Family History: Family History  Problem Relation Age of Onset  . Varicose Veins Mother   . Heart disease Father     Social History:  reports that he has been smoking cigarettes.  He has been smoking about 1.00 pack per day. he has never used smokeless tobacco. He reports that he does not drink alcohol or use drugs.  ROS: UROLOGY Frequent Urination?: Yes Hard to postpone urination?: Yes Burning/pain with urination?: No Get up at night to urinate?: Yes Leakage of urine?: Yes Urine stream starts and stops?: Yes Trouble starting stream?: Yes Do you have to strain to urinate?: No Blood in urine?: Yes Urinary tract infection?: No Sexually transmitted disease?: No Injury to kidneys or bladder?: No Painful intercourse?: No Weak stream?: Yes Erection problems?: No Penile pain?: No  Gastrointestinal Nausea?: No Vomiting?: No Indigestion/heartburn?: No Diarrhea?: No Constipation?: No  Constitutional Fever: No Night sweats?: No Weight loss?: No Fatigue?: Yes  Skin Skin  rash/lesions?: No Itching?: No  Eyes Blurred vision?: Yes Double vision?: No  Ears/Nose/Throat Sore throat?: No Sinus problems?: No  Hematologic/Lymphatic Swollen glands?: No Easy bruising?: Yes  Cardiovascular Leg swelling?: No Chest pain?: No  Respiratory Cough?: No Shortness of breath?: No  Endocrine Excessive thirst?: No  Musculoskeletal Back pain?: Yes Joint pain?: No  Neurological Headaches?: Yes Dizziness?: No  Psychologic Depression?: Yes Anxiety?: No  Physical Exam: BP (!) 153/92 (BP Location: Left Arm, Patient Position: Sitting, Cuff Size: Normal)   Pulse 80   Ht 5\' 10"  (1.778 m)   Wt 159 lb 3.2 oz (72.2 kg)   BMI 22.84 kg/m   Constitutional:  Alert and oriented, No acute distress.  Accompanied by granddaughter today. HEENT: [place] AT, moist mucus membranes.  Trachea midline, no masses. Cardiovascular: No clubbing, cyanosis, or edema. Respiratory: Normal respiratory effort, no increased work of breathing. Skin: No rashes, bruises or suspicious lesions. Neurologic: Grossly intact, no focal deficits, moving all 4 extremities.  Somewhat forgetful with occasional word finding difficulties. Psychiatric: Normal mood and affect.  Laboratory Data: Lab Results  Component Value Date   WBC 6.2 03/31/2017   HGB 17.1 03/31/2017   HCT 49.9 03/31/2017   MCV 100.3 (H) 03/31/2017   PLT 109 (L) 03/31/2017    Lab Results  Component Value Date   CREATININE 1.00 11/25/2015    Urinalysis N/a  Pertinent Imaging: N/a  Assessment & Plan:    1. Prostate cancer [facility]) Currently undergoing IMRT, nearly complete with course Lengthy discussion today about the risks and benefits of ADT recommended for 6 months per NCCN guidelines Side effects of ADT were reviewed today in detail He is unsure whether or not he like to proceed with this, we will discuss the patient with Dr. Baruch Gouty   2. BPH with obstruction/lower urinary tract symptoms Urinary symptoms  relatively stable Continue Flomax  3. Gross hematuria Episode of gross hematuria 2 days ago during radiation treatment Seems to be improving spontaneously Encourage fluids Previous cystoscopy unremarkable for bladder pathology Warning symptoms reviewed including signs and symptoms of clot retention   Return in about 3 months (around 07/11/2017), or recheck urinary symptoms.  Hollice Espy, MD  [facility] [address], [place], [place] [address] [phone]  I spent 25 min with this patient of which greater than 50% was spent in counseling and coordination of care  with the patient.

## 2017-04-13 ENCOUNTER — Ambulatory Visit
Admission: RE | Admit: 2017-04-13 | Discharge: 2017-04-13 | Disposition: A | Discharge status: Home / Self Care | Payer: Medicare Other | Source: Ambulatory Visit | Attending: Radiation Oncology | Admitting: Radiation Oncology

## 2017-04-13 DIAGNOSIS — Z510 Encounter for antineoplastic radiation therapy: Secondary | ICD-10-CM | POA: Diagnosis not present

## 2017-04-14 ENCOUNTER — Ambulatory Visit
Admission: RE | Admit: 2017-04-14 | Discharge: 2017-04-14 | Disposition: A | Discharge status: Home / Self Care | Payer: Medicare Other | Source: Ambulatory Visit | Attending: Radiation Oncology | Admitting: Radiation Oncology

## 2017-04-14 ENCOUNTER — Other Ambulatory Visit: Payer: Self-pay | Admitting: *Deleted

## 2017-04-14 DIAGNOSIS — Z510 Encounter for antineoplastic radiation therapy: Secondary | ICD-10-CM | POA: Diagnosis not present

## 2017-04-14 DIAGNOSIS — C61 Malignant neoplasm of prostate: Secondary | ICD-10-CM

## 2017-04-15 ENCOUNTER — Ambulatory Visit
Admission: RE | Admit: 2017-04-15 | Discharge: 2017-04-15 | Disposition: A | Discharge status: Home / Self Care | Payer: Medicare Other | Source: Ambulatory Visit | Attending: Radiation Oncology | Admitting: Radiation Oncology

## 2017-04-15 DIAGNOSIS — Z510 Encounter for antineoplastic radiation therapy: Secondary | ICD-10-CM | POA: Diagnosis not present

## 2017-04-18 ENCOUNTER — Other Ambulatory Visit: Payer: Self-pay | Admitting: *Deleted

## 2017-04-18 ENCOUNTER — Inpatient Hospital Stay: Payer: Medicare Other

## 2017-04-18 ENCOUNTER — Ambulatory Visit
Admission: RE | Admit: 2017-04-18 | Discharge: 2017-04-18 | Disposition: A | Discharge status: Home / Self Care | Payer: Medicare Other | Source: Ambulatory Visit | Attending: Radiation Oncology | Admitting: Radiation Oncology

## 2017-04-18 DIAGNOSIS — C61 Malignant neoplasm of prostate: Secondary | ICD-10-CM | POA: Diagnosis not present

## 2017-04-18 DIAGNOSIS — Z510 Encounter for antineoplastic radiation therapy: Secondary | ICD-10-CM | POA: Diagnosis not present

## 2017-04-18 MED ORDER — LEUPROLIDE ACETATE (4 MONTH) 30 MG IM KIT: 30.0000 mg | PACK | Freq: Once | INTRAMUSCULAR | Status: AC

## 2017-04-18 MED ORDER — LEUPROLIDE ACETATE (4 MONTH) 30 MG IM KIT
30.0000 mg | PACK | Freq: Once | INTRAMUSCULAR | Status: AC
  Administered 2017-04-18: 30 mg via INTRAMUSCULAR
  Filled 2017-04-18: qty 30

## 2017-04-19 ENCOUNTER — Ambulatory Visit
Admission: RE | Admit: 2017-04-19 | Discharge: 2017-04-19 | Disposition: A | Discharge status: Home / Self Care | Payer: Medicare Other | Source: Ambulatory Visit | Attending: Radiation Oncology | Admitting: Radiation Oncology

## 2017-04-19 DIAGNOSIS — Z510 Encounter for antineoplastic radiation therapy: Secondary | ICD-10-CM | POA: Diagnosis not present

## 2017-04-20 ENCOUNTER — Ambulatory Visit: Payer: Medicare Other

## 2017-04-20 ENCOUNTER — Ambulatory Visit
Admission: RE | Admit: 2017-04-20 | Discharge: 2017-04-20 | Disposition: A | Discharge status: Home / Self Care | Payer: Medicare Other | Source: Ambulatory Visit | Attending: Radiation Oncology | Admitting: Radiation Oncology

## 2017-04-20 DIAGNOSIS — Z510 Encounter for antineoplastic radiation therapy: Secondary | ICD-10-CM | POA: Diagnosis not present

## 2017-04-21 ENCOUNTER — Ambulatory Visit
Admission: RE | Admit: 2017-04-21 | Discharge: 2017-04-21 | Disposition: A | Discharge status: Home / Self Care | Payer: Medicare Other | Source: Ambulatory Visit | Attending: Radiation Oncology | Admitting: Radiation Oncology

## 2017-04-21 ENCOUNTER — Ambulatory Visit: Payer: Medicare Other

## 2017-04-22 ENCOUNTER — Ambulatory Visit
Admission: RE | Admit: 2017-04-22 | Discharge: 2017-04-22 | Disposition: A | Discharge status: Home / Self Care | Payer: Medicare Other | Source: Ambulatory Visit | Attending: Radiation Oncology | Admitting: Radiation Oncology

## 2017-04-22 DIAGNOSIS — Z510 Encounter for antineoplastic radiation therapy: Secondary | ICD-10-CM | POA: Diagnosis not present

## 2017-04-25 ENCOUNTER — Ambulatory Visit
Admission: RE | Admit: 2017-04-25 | Discharge: 2017-04-25 | Disposition: A | Discharge status: Home / Self Care | Payer: Medicare Other | Source: Ambulatory Visit | Attending: Radiation Oncology | Admitting: Radiation Oncology

## 2017-04-25 DIAGNOSIS — Z510 Encounter for antineoplastic radiation therapy: Secondary | ICD-10-CM | POA: Diagnosis not present

## 2017-04-26 ENCOUNTER — Ambulatory Visit: Payer: Medicare Other

## 2017-04-27 ENCOUNTER — Ambulatory Visit: Payer: Medicare Other

## 2017-04-27 ENCOUNTER — Ambulatory Visit
Admission: RE | Admit: 2017-04-27 | Discharge: 2017-04-27 | Disposition: A | Discharge status: Home / Self Care | Payer: Medicare Other | Source: Ambulatory Visit | Attending: Radiation Oncology | Admitting: Radiation Oncology

## 2017-04-27 DIAGNOSIS — Z510 Encounter for antineoplastic radiation therapy: Secondary | ICD-10-CM | POA: Diagnosis not present

## 2017-04-27 DIAGNOSIS — C61 Malignant neoplasm of prostate: Secondary | ICD-10-CM | POA: Diagnosis not present

## 2017-04-28 ENCOUNTER — Ambulatory Visit
Admission: RE | Admit: 2017-04-28 | Discharge: 2017-04-28 | Disposition: A | Discharge status: Home / Self Care | Payer: Medicare Other | Source: Ambulatory Visit | Attending: Radiation Oncology | Admitting: Radiation Oncology

## 2017-04-28 DIAGNOSIS — Z510 Encounter for antineoplastic radiation therapy: Secondary | ICD-10-CM | POA: Diagnosis not present

## 2017-06-03 ENCOUNTER — Ambulatory Visit: Payer: Medicare Other | Admitting: Radiation Oncology

## 2017-06-10 ENCOUNTER — Encounter: Payer: Self-pay | Admitting: Radiation Oncology

## 2017-06-10 ENCOUNTER — Ambulatory Visit
Admission: RE | Admit: 2017-06-10 | Discharge: 2017-06-10 | Disposition: A | Discharge status: Home / Self Care | Payer: Medicare Other | Source: Ambulatory Visit | Attending: Radiation Oncology | Admitting: Radiation Oncology

## 2017-06-10 ENCOUNTER — Other Ambulatory Visit: Payer: Self-pay

## 2017-06-10 VITALS — BP 107/68 | HR 69 | Temp 97.8°F | Wt 158.7 lb

## 2017-06-10 DIAGNOSIS — C61 Malignant neoplasm of prostate: Secondary | ICD-10-CM | POA: Insufficient documentation

## 2017-06-10 DIAGNOSIS — G20 Parkinson's disease: Secondary | ICD-10-CM | POA: Insufficient documentation

## 2017-06-10 DIAGNOSIS — Z923 Personal history of irradiation: Secondary | ICD-10-CM | POA: Diagnosis not present

## 2017-06-10 NOTE — Progress Notes (Signed)
Radiation Oncology Follow up Note  Name: Douglas Clark   Date:   06/10/2017 MRN:  [id number] DOB: [birth year]    This 77 y.o. male presents to the clinic today for one-month follow-up status post external beam radiation therapy for stage IIB Gleason 7 (3+4) adenocarcinoma the prostate presenting the PSA of 5.  REFERRING PROVIDER: Idelle Crouch, MD  HPI: patient is a 77 year old male.with history of Parkinson's disease who is now 1 month out fromIM RT radiation therapy for Gleason 7 (3+4) adenocarcinoma the prostate presenting the PSA of 5. He is seen today in routine follow-up is doing well he states his urinary symptoms have improved he has nocturia 1 no specific urgency or frequency. He is having no diarrhea.he is currently on ADT suppression.  COMPLICATIONS OF TREATMENT: none  FOLLOW UP COMPLIANCE: keeps appointments   PHYSICAL EXAM:  BP 107/68   Pulse 69   Temp 97.8 F (36.6 C)   Wt 158 lb 11.7 oz (72 kg)   BMI 22.78 kg/m  Well-developed well-nourished patient in NAD. HEENT reveals PERLA, EOMI, discs not visualized.  Oral cavity is clear. No oral mucosal lesions are identified. Neck is clear without evidence of cervical or supraclavicular adenopathy. Lungs are clear to A&P. Cardiac examination is essentially unremarkable with regular rate and rhythm without murmur rub or thrill. Abdomen is benign with no organomegaly or masses noted. Motor sensory and DTR levels are equal and symmetric in the upper and lower extremities. Cranial nerves II through XII are grossly intact. Proprioception is intact. No peripheral adenopathy or edema is identified. No motor or sensory levels are noted. Crude visual fields are within normal range.  RADIOLOGY RESULTS: no current films for review  PLAN: resent time patient is recovering nicely from his external beam radiation therapy. I'm please was overall progress. I've explained to them my PSA for follow-up which will be first PSA in about 3 months  prior to his next follow-up visit. Patient knows to call with any concerns. He continues to do well.  I would like to take this opportunity to thank you for allowing me to participate in the care of your patient.Noreene Filbert, MD

## 2017-07-12 ENCOUNTER — Ambulatory Visit (INDEPENDENT_AMBULATORY_CARE_PROVIDER_SITE_OTHER): Payer: Medicare Other | Admitting: Urology

## 2017-07-12 ENCOUNTER — Encounter: Payer: Self-pay | Admitting: Urology

## 2017-07-12 VITALS — BP 146/80 | HR 71 | Ht 70.0 in | Wt 159.6 lb

## 2017-07-12 DIAGNOSIS — N200 Calculus of kidney: Secondary | ICD-10-CM | POA: Diagnosis not present

## 2017-07-12 DIAGNOSIS — N138 Other obstructive and reflux uropathy: Secondary | ICD-10-CM | POA: Diagnosis not present

## 2017-07-12 DIAGNOSIS — C61 Malignant neoplasm of prostate: Secondary | ICD-10-CM

## 2017-07-12 DIAGNOSIS — N401 Enlarged prostate with lower urinary tract symptoms: Secondary | ICD-10-CM

## 2017-07-12 LAB — BLADDER SCAN AMB NON-IMAGING: Scan Result: 32

## 2017-07-12 NOTE — Progress Notes (Signed)
07/12/2017 4:45 PM   Douglas Clark 1940/10/01 [id number]  Referring provider: Idelle Crouch, MD [facility] [place], [place] [address]  Chief Complaint  Patient presents with  . Prostate Cancer    HPI: 77 year old male who returns today to discuss multiple GU issues.  He status post IM RT currently on ADT for relatively recently diagnosed prostate cancer.  Prostate cancer Recently diagnosed with intermediate risk of high volume prostate cancer 12/2016 .  PSA 5.09 slowly rising over the past 2 years.  Rectal exam was abnormal bilaterally with a nodule at the apex.  Additionally, and a 1.5 cm nodular focus of enhancement in the peripheral zone there is no evidence of lymphadenopathy at that point.  Prostate biopsy revealed 12 of 12 cores positive for cancer, Gleason 3+4, high volume up to 99% of the tissue.  There was evidence of perineural invasion.  TRUS volume 33 g.  He is now s/p IMRT in 03/2017 and ADT (4 month depo given 03/2017).  He is tolerating ADT well without hot flashes.  He is due for another injection next month at the cancer center.  BPH with urinary frequency and weak stream He complains today of urinary urgency and frequency. He describes episodes where he gets to the bathroom to go, but then can't.  Stream remains weak.  He does sit to void.  Nocturia x 1-2.    PVR today minimal.  Underwent "heat shrinking" prostate procedure in 2005 which was ineffective.  Chronically on flomax.   Microscopic hematuria Review of records reveal multiple episodes of microscopic hematuria now previous evaluation. No gross hematuria or UTIs.  On chronic ASA 81 mg managed by Dr. Ubaldo Glassing.  He is a current smoker, 1 ppd  S/p cysto at the time of prostate biopsy without evidence of bladder pathology    Erectile dysfunction Able to achieve but not maintain an erection. He reports that he loses interest.   History of kidney stones History of  recurrent nephrolithiasis, multiple stone procedures including ESWL in the past.  Asymptomatic 12 mm left upper pole renal stone on most recent CT scan. No recent flank pain.  Renal cysts Multiple enlarging exophytic renal cyst, underwent further workup with MRI 6/2018of the abdomen characterized lesions as simple cyst. Largest cyst 1.3 cm on right kidney.    PMH: Past Medical History:  Diagnosis Date  . Anxiety   . Arthritis   . BBB (bundle branch block)    RIGHT AND LEFT  . Cancer [facility])    Prostate Cancer  . Chronic fatigue and malaise    with STM loss  . Chronic kidney disease    stone  . Concussion 1993   with long term memory loss  . Coronary artery disease   . Diverticulosis   . Emphysema lung ([place])   . GERD (gastroesophageal reflux disease)   . HH (hiatus hernia)   . History of kidney stones   . Hyperlipidemia   . Hypertension   . Myocardial infarction ([place])   . Parkinson disease ([place])   . Weight loss     Surgical History: Past Surgical History:  Procedure Laterality Date  . CARDIAC CATHETERIZATION    . COLONOSCOPY WITH PROPOFOL N/A 02/16/2016   Procedure: COLONOSCOPY WITH PROPOFOL;  Surgeon: Lollie Sails, MD;  Location: [facility] ENDOSCOPY;  Service: Endoscopy;  Laterality: N/A;  . CORONARY ARTERY BYPASS GRAFT     2000      . ESOPHAGOGASTRODUODENOSCOPY (EGD) WITH PROPOFOL  N/A 02/16/2016   Procedure: ESOPHAGOGASTRODUODENOSCOPY (EGD) WITH PROPOFOL;  Surgeon: Lollie Sails, MD;  Location: [facility] ENDOSCOPY;  Service: Endoscopy;  Laterality: N/A;  . ESOPHAGOGASTRODUODENOSCOPY (EGD) WITH PROPOFOL N/A 02/18/2017   Procedure: ESOPHAGOGASTRODUODENOSCOPY (EGD) WITH PROPOFOL;  Surgeon: Lollie Sails, MD;  Location: [facility] ENDOSCOPY;  Service: Endoscopy;  Laterality: N/A;  . KIDNEY SURGERY     REMOVED STONE FROM RT KIDNEY  . LITHOTRIPSY     X2   . LUMBAR LAMINECTOMY/DECOMPRESSION MICRODISCECTOMY Bilateral 02/04/2015   Procedure: LUMBAR  LAMINECTOMY/DECOMPRESSION MICRODISCECTOMY  Bilateral - Lumbar two-three lumbar three-four lumbar four-five lumbar five sacral one ;  Surgeon: Leeroy Cha, MD;  Location: [facility] NEURO ORS;  Service: Neurosurgery;  Laterality: Bilateral;  . PILONIDAL CYST / SINUS EXCISION      Home Medications:  Allergies as of 07/12/2017      Reactions   Donepezil    Doxazosin Hives   Trazodone And Nefazodone Other (See Comments)   shivers   Hctz [hydrochlorothiazide] Rash   blisters      Medication List        Accurate as of 07/12/17  4:45 PM. Always use your most recent med list.          acetaminophen 500 MG tablet Commonly known as:  TYLENOL Take 500 mg by mouth every 6 (six) hours as needed. pain   amitriptyline 25 MG tablet Commonly known as:  ELAVIL Take 1 tablet by mouth daily.   aspirin 81 MG chewable tablet Chew 81 mg by mouth daily.   BENEFIBER DRINK MIX PO Take 5 mLs by mouth daily.   carbidopa-levodopa 25-100 MG tablet Commonly known as:  SINEMET IR Take 1 tablet by mouth 3 (three) times daily.   carvedilol 12.5 MG tablet Commonly known as:  COREG Take 1 tablet (12.5 mg total) by mouth 2 (two) times daily with a meal.   cholecalciferol 1000 units tablet Commonly known as:  VITAMIN D Take 2,000 Units by mouth daily.   clonazePAM 2 MG tablet Commonly known as:  KLONOPIN Take 2 mg by mouth at bedtime.   donepezil 5 MG tablet Commonly known as:  ARICEPT Take 5 mg by mouth at bedtime.   esomeprazole 20 MG capsule Commonly known as:  NEXIUM Take 20 mg by mouth daily.   HYDROcodone-acetaminophen 5-325 MG tablet Commonly known as:  NORCO/VICODIN Take 1 tablet by mouth every 6 (six) hours as needed for moderate pain.   losartan 50 MG tablet Commonly known as:  COZAAR Take by mouth.   mirtazapine 30 MG tablet Commonly known as:  REMERON Take 30 mg by mouth at bedtime.   MULTI-VITAMINS Tabs Take 1 tablet by mouth daily.   nitroGLYCERIN 0.4 MG SL  tablet Commonly known as:  NITROSTAT Place 1 tablet (0.4 mg total) under the tongue every 5 (five) minutes as needed for chest pain.   PREVAGEN 10 MG Caps Generic drug:  Apoaequorin Take 1 capsule by mouth daily.   simvastatin 20 MG tablet Commonly known as:  ZOCOR Take 20 mg by mouth daily.   sucralfate 1 g tablet Commonly known as:  CARAFATE Take 1 tablet (1 g total) by mouth 3 (three) times daily.   tamsulosin 0.4 MG Caps capsule Commonly known as:  FLOMAX Take 1 capsule by mouth daily.   vitamin B-12 1000 MCG tablet Commonly known as:  CYANOCOBALAMIN Take 2 tablets by mouth daily.       Allergies:  Allergies  Allergen Reactions  . Donepezil   .  Doxazosin Hives  . Trazodone And Nefazodone Other (See Comments)    shivers  . Hctz [Hydrochlorothiazide] Rash    blisters    Family History: Family History  Problem Relation Age of Onset  . Varicose Veins Mother   . Heart disease Father     Social History:  reports that he has been smoking cigarettes.  He has been smoking about 1.00 pack per day. He has never used smokeless tobacco. He reports that he does not drink alcohol or use drugs.  ROS: UROLOGY Frequent Urination?: Yes Hard to postpone urination?: Yes Burning/pain with urination?: No Get up at night to urinate?: Yes Leakage of urine?: Yes Urine stream starts and stops?: Yes Trouble starting stream?: Yes Do you have to strain to urinate?: No Blood in urine?: Yes Urinary tract infection?: No Sexually transmitted disease?: No Injury to kidneys or bladder?: No Painful intercourse?: No Weak stream?: Yes Erection problems?: No Penile pain?: No  Gastrointestinal Nausea?: No Vomiting?: No Indigestion/heartburn?: No Diarrhea?: No Constipation?: No  Constitutional Fever: No Night sweats?: No Weight loss?: No Fatigue?: Yes  Skin Skin rash/lesions?: Yes Itching?: No  Eyes Blurred vision?: No Double vision?: No  Ears/Nose/Throat Sore  throat?: No Sinus problems?: No  Hematologic/Lymphatic Swollen glands?: No Easy bruising?: Yes  Cardiovascular Leg swelling?: Yes Chest pain?: Yes  Respiratory Cough?: Yes Shortness of breath?: No  Endocrine Excessive thirst?: No  Musculoskeletal Back pain?: Yes Joint pain?: Yes  Neurological Headaches?: Yes Dizziness?: No  Psychologic Depression?: Yes Anxiety?: Yes  Physical Exam: BP (!) 146/80 (BP Location: Left Arm, Patient Position: Sitting, Cuff Size: Normal)   Pulse 71   Ht 5\' 10"  (1.778 m)   Wt 159 lb 9.6 oz (72.4 kg)   BMI 22.90 kg/m   Constitutional:  Alert and oriented, No acute distress.  Accompanied by wife today.  Ambulating with cane.  Pleasant. HEENT: [place] AT, moist mucus membranes.  Trachea midline, no masses. Neurologic: Grossly intact, no focal deficits, moving all 4 extremities.  Somewhat forgetful with occasional word finding difficulties. Psychiatric: Normal mood and affect.  Laboratory Data: Lab Results  Component Value Date   WBC 6.2 03/31/2017   HGB 17.1 03/31/2017   HCT 49.9 03/31/2017   MCV 100.3 (H) 03/31/2017   PLT 109 (L) 03/31/2017    Lab Results  Component Value Date   CREATININE 1.00 11/25/2015    Urinalysis N/a  Pertinent Imaging: Results for orders placed or performed in visit on 07/12/17  Bladder Scan (Post Void Residual) in office  Result Value Ref Range   Scan Result 32      Assessment & Plan:    1. Prostate cancer [facility]) Defer ADT again next month Status post IM RT Too soon for PSA, will follow in the future  2. BPH with obstruction/lower urinary tract symptoms Urinary symptoms relatively stable but bothersome Continue Flomax Adequate bladder emptying today I prefer to wait at least 6 months after completion of I am RT to pursue any surgical intervention, patient is agreeable this plan We will reassess in 6 months and at that time, may consider channel TURP as treatment option  3. Kidney stone on left  side Currently asymptomatic but does have a fairly significant sized stone KUB next visit to assess for any enlargement   Return in about 6 months (around 01/11/2018) for KUB, IPSS/ PVR/ UA.  Hollice Espy, MD  [facility] [address], [place], [place] [address] [phone]

## 2017-09-09 ENCOUNTER — Other Ambulatory Visit: Payer: Self-pay

## 2017-09-09 ENCOUNTER — Inpatient Hospital Stay: Payer: Medicare Other | Attending: Radiation Oncology

## 2017-09-09 DIAGNOSIS — Z79818 Long term (current) use of other agents affecting estrogen receptors and estrogen levels: Secondary | ICD-10-CM | POA: Insufficient documentation

## 2017-09-09 DIAGNOSIS — C61 Malignant neoplasm of prostate: Secondary | ICD-10-CM | POA: Insufficient documentation

## 2017-09-09 DIAGNOSIS — Z923 Personal history of irradiation: Secondary | ICD-10-CM | POA: Diagnosis not present

## 2017-09-09 LAB — PSA: Prostatic Specific Antigen: 0.03 ng/mL (ref 0.00–4.00)

## 2017-09-16 ENCOUNTER — Other Ambulatory Visit: Payer: Self-pay | Admitting: *Deleted

## 2017-09-16 ENCOUNTER — Ambulatory Visit
Admission: RE | Admit: 2017-09-16 | Discharge: 2017-09-16 | Disposition: A | Discharge status: Home / Self Care | Payer: Medicare Other | Source: Ambulatory Visit | Attending: Radiation Oncology | Admitting: Radiation Oncology

## 2017-09-16 ENCOUNTER — Encounter: Payer: Self-pay | Admitting: Radiation Oncology

## 2017-09-16 ENCOUNTER — Inpatient Hospital Stay: Payer: Medicare Other

## 2017-09-16 VITALS — BP 136/81 | HR 66 | Temp 97.5°F | Resp 18 | Wt 168.2 lb

## 2017-09-16 DIAGNOSIS — Z923 Personal history of irradiation: Secondary | ICD-10-CM | POA: Diagnosis not present

## 2017-09-16 DIAGNOSIS — C61 Malignant neoplasm of prostate: Secondary | ICD-10-CM

## 2017-09-16 MED ORDER — LEUPROLIDE ACETATE (4 MONTH) 30 MG IM KIT
30.0000 mg | PACK | Freq: Once | INTRAMUSCULAR | Status: AC
  Administered 2017-09-16: 30 mg via INTRAMUSCULAR

## 2017-09-16 NOTE — Progress Notes (Signed)
Radiation Oncology Follow up Note  Name: Douglas Clark   Date:   09/16/2017 MRN:  [id number] DOB: 1940-05-28    This 77 y.o. male presents to the clinic today for four-month follow-up status post I MRT radiation therapy for stage IIBGleason 7 (3+4) adenocarcinoma the prostate presenting the PSA of 5.  REFERRING PROVIDER: Idelle Crouch, MD  HPI: patient is a 77 year old male now out 4 months having completed IM RT radiation therapy to his prostate for Gleason 7 (3+4) adenocarcinoma presenting the PSA of 5. His urinary symptoms are improving he has nocturia 2 some urgency and frequency and decreased stream. These symptoms all pre-date his radiation therapy treatments. He continues on Lupron therapy. His most recent PSA was 9.93. COMPLICATIONS OF TREATMENT: none  FOLLOW UP COMPLIANCE: keeps appointments   PHYSICAL EXAM:  BP 136/81   Pulse 66   Temp (!) 97.5 F (36.4 C)   Resp 18   Wt 168 lb 3.4 oz (76.3 kg)   BMI 24.14 kg/m  Well-developed thin wheelchair-bound male in NADWell-developed well-nourished patient in NAD. HEENT reveals PERLA, EOMI, discs not visualized.  Oral cavity is clear. No oral mucosal lesions are identified. Neck is clear without evidence of cervical or supraclavicular adenopathy. Lungs are clear to A&P. Cardiac examination is essentially unremarkable with regular rate and rhythm without murmur rub or thrill. Abdomen is benign with no organomegaly or masses noted. Motor sensory and DTR levels are equal and symmetric in the upper and lower extremities. Cranial nerves II through XII are grossly intact. Proprioception is intact. No peripheral adenopathy or edema is identified. No motor or sensory levels are noted. Crude visual fields are within normal range.  RADIOLOGY RESULTS: no current films for review  PLAN: present time patient is doing well with excellent biochemical results of his treatment. I'm please was overall progress. He continues on Lupron suppression.  I've asked to see him back in 6 months for follow-up. Will obtain a PSA prior to treatment at that time. Patient knows to call with any concerns. He continues close follow-up care with urology.  I would like to take this opportunity to thank you for allowing me to participate in the care of your patient.Noreene Filbert, MD

## 2017-09-19 ENCOUNTER — Other Ambulatory Visit: Payer: Self-pay | Admitting: *Deleted

## 2017-09-19 DIAGNOSIS — C61 Malignant neoplasm of prostate: Secondary | ICD-10-CM

## 2018-01-10 ENCOUNTER — Ambulatory Visit (INDEPENDENT_AMBULATORY_CARE_PROVIDER_SITE_OTHER): Payer: Medicare Other | Admitting: Urology

## 2018-01-10 ENCOUNTER — Encounter: Payer: Self-pay | Admitting: Urology

## 2018-01-10 ENCOUNTER — Ambulatory Visit
Admission: RE | Admit: 2018-01-10 | Discharge: 2018-01-10 | Disposition: A | Discharge status: Home / Self Care | Payer: Medicare Other | Source: Ambulatory Visit | Attending: Urology | Admitting: Urology

## 2018-01-10 VITALS — BP 157/87 | HR 72 | Ht 70.0 in | Wt 177.2 lb

## 2018-01-10 DIAGNOSIS — N138 Other obstructive and reflux uropathy: Secondary | ICD-10-CM

## 2018-01-10 DIAGNOSIS — R3129 Other microscopic hematuria: Secondary | ICD-10-CM

## 2018-01-10 DIAGNOSIS — N401 Enlarged prostate with lower urinary tract symptoms: Secondary | ICD-10-CM | POA: Diagnosis not present

## 2018-01-10 DIAGNOSIS — N3281 Overactive bladder: Secondary | ICD-10-CM

## 2018-01-10 DIAGNOSIS — C61 Malignant neoplasm of prostate: Secondary | ICD-10-CM

## 2018-01-10 DIAGNOSIS — N200 Calculus of kidney: Secondary | ICD-10-CM | POA: Insufficient documentation

## 2018-01-10 LAB — URINALYSIS, COMPLETE
Bilirubin, UA: NEGATIVE
Glucose, UA: NEGATIVE
Ketones, UA: NEGATIVE
Nitrite, UA: NEGATIVE
SPEC GRAV UA: 1.025 (ref 1.005–1.030)
Urobilinogen, Ur: 0.2 mg/dL (ref 0.2–1.0)
pH, UA: 5.5 (ref 5.0–7.5)

## 2018-01-10 LAB — MICROSCOPIC EXAMINATION
Bacteria, UA: NONE SEEN
Epithelial Cells (non renal): NONE SEEN /hpf (ref 0–10)

## 2018-01-10 LAB — BLADDER SCAN AMB NON-IMAGING: Scan Result: 32

## 2018-01-10 NOTE — Progress Notes (Signed)
01/10/2018 3:31 PM   Douglas Clark Dec 18, 1940 [id number]  Referring provider: Idelle Crouch, MD [name], [name] [address]  Chief Complaint  Patient presents with  . Prostate Cancer    69month    HPI: 77 year old male who returns today to discuss multiple GU issues.  He status post IMRT currently on ADT for relatively recently diagnosed prostate cancer.  Prostate cancer Recently diagnosed with intermediate risk of high volume prostate cancer 12/2016 .  PSA 5.09 slowly rising over the past 2 years.  Rectal exam was abnormal bilaterally with a nodule at the apex.  Additionally, and a 1.5 cm nodular focus of enhancement in the peripheral zone there is no evidence of lymphadenopathy at that point.  Prostate biopsy revealed 12 of 12 cores positive for cancer, Gleason 3+4, high volume up to 99% of the tissue.  There was evidence of perineural invasion.  TRUS volume 33 g.  He is now s/p IMRT in 03/2017 and ADT (4 month depo given 08/2017, 30 mg).  He is tolerating ADT well without hot flashes.   BPH with urinary frequency and weak stream Today, he reports that he is having poorly controlled urinary symptoms.  He really feels like he is emptying his bladder sometimes.  He also has urgency and frequency with difficulty postponing his urination.  He gets up 3 times at night to void.  No dysuria or gross hematuria.  IPSS as below.  Underwent "heat shrinking" prostate procedure in 2005 which was ineffective.  Chronically on flomax.     Personal history of Parkinson's disease.  PVR today minimal  IPSS    Row Name 01/10/18 1500         International Prostate Symptom Score   How often have you had the sensation of not emptying your bladder?  About half the time     How often have you had to urinate less than every two hours?  More than half the time     How often have you found you stopped and started again several times when you urinated?   More than half the time     How often have you found it difficult to postpone urination?  Almost always     How often have you had a weak urinary stream?  Almost always     How often have you had to strain to start urination?  More than half the time     How many times did you typically get up at night to urinate?  3 Times     Total IPSS Score  28       Quality of Life due to urinary symptoms   If you were to spend the rest of your life with your urinary condition just the way it is now how would you feel about that?  Unhappy        Score:  1-7 Mild 8-19 Moderate 20-35 Severe   Microscopic hematuria Review of records reveal multiple episodes of microscopic hematuria now previous evaluation. No gross hematuria or UTIs.  On chronic ASA 81 mg managed by Dr. Ubaldo Clark.  He is a current smoker, 1 ppd  S/p cysto at the time of prostate biopsywithout evidence of bladder pathology   Erectile dysfunction Able to achieve but not maintain an erection. He reports that he loses interest.   History of kidney stones History of recurrent nephrolithiasis, multiple stone procedures including ESWL in the past.  Asymptomatic 12 mm  left upper pole renal stone on most recent CT scan (6.2018). No recent flank pain.  KUB today shows an essentially stable stone in the left upper pole.  He has a punctate stone on the right.   Renal cysts Multiple enlarging exophytic renal cyst, underwent further workup with MRI 6/2018of the abdomen characterized lesions as simple cyst. Largest cyst 1.3 cm on right kidney.   PMH: Past Medical History:  Diagnosis Date  . Anxiety   . Arthritis   . BBB (bundle branch block)    RIGHT AND LEFT  . Cancer [name])    Prostate Cancer  . Chronic fatigue and malaise    with STM loss  . Chronic kidney disease    stone  . Concussion 1993   with long term memory loss  . Coronary artery disease   . Diverticulosis   . Emphysema lung ([place])   . GERD  (gastroesophageal reflux disease)   . HH (hiatus hernia)   . History of kidney stones   . Hyperlipidemia   . Hypertension   . Myocardial infarction ([place])   . Parkinson disease ([place])   . Weight loss     Surgical History: Past Surgical History:  Procedure Laterality Date  . CARDIAC CATHETERIZATION    . COLONOSCOPY WITH PROPOFOL N/A 02/16/2016   Procedure: COLONOSCOPY WITH PROPOFOL;  Surgeon: Douglas Sails, MD;  Location: [facility] ENDOSCOPY;  Service: Endoscopy;  Laterality: N/A;  . CORONARY ARTERY BYPASS GRAFT     2000      . ESOPHAGOGASTRODUODENOSCOPY (EGD) WITH PROPOFOL N/A 02/16/2016   Procedure: ESOPHAGOGASTRODUODENOSCOPY (EGD) WITH PROPOFOL;  Surgeon: Douglas Sails, MD;  Location[facility] ENDOSCOPY;  Service: Endoscopy;  Laterality: N/A;  . ESOPHAGOGASTRODUODENOSCOPY (EGD) WITH PROPOFOL N/A 02/18/2017   Procedure: ESOPHAGOGASTRODUODENOSCOPY (EGD) WITH PROPOFOL;  Surgeon: Douglas Sails, MD;  Location: [facility] ENDOSCOPY;  Service: Endoscopy;  Laterality: N/A;  . KIDNEY SURGERY     REMOVED STONE FROM RT KIDNEY  . LITHOTRIPSY     X2   . LUMBAR LAMINECTOMY/DECOMPRESSION MICRODISCECTOMY Bilateral 02/04/2015   Procedure: LUMBAR LAMINECTOMY/DECOMPRESSION MICRODISCECTOMY  Bilateral - Lumbar two-three lumbar three-four lumbar four-five lumbar five sacral one ;  Surgeon: Douglas Cha, MD;  Location: [place] NEURO ORS;  Service: Neurosurgery;  Laterality: Bilateral;  . PILONIDAL CYST / SINUS EXCISION      Home Medications:  Allergies as of 01/10/2018      Reactions   Donepezil    Doxazosin Hives   Trazodone And Nefazodone Other (See Comments)   shivers   Hctz [hydrochlorothiazide] Rash   blisters      Medication List        Accurate as of 01/10/18 11:59 PM. Always use your most recent med list.          acetaminophen 500 MG tablet Commonly known as:  TYLENOL Take 500 mg by mouth every 6 (six) hours as needed. pain   amitriptyline 25 MG tablet Commonly known as:   ELAVIL Take 1 tablet by mouth daily.   aspirin 81 MG chewable tablet Chew 81 mg by mouth daily.   BENEFIBER DRINK MIX PO Take 5 mLs by mouth daily.   carbidopa-levodopa 25-100 MG tablet Commonly known as:  SINEMET IR Take 1 tablet by mouth 3 (three) times daily.   carvedilol 12.5 MG tablet Commonly known as:  COREG Take 1 tablet (12.5 mg total) by mouth 2 (two) times daily with a meal.   cholecalciferol 1000 units tablet Commonly known as:  VITAMIN D Take 2,000  Units by mouth daily.   clonazePAM 2 MG tablet Commonly known as:  KLONOPIN Take 2 mg by mouth at bedtime.   donepezil 5 MG tablet Commonly known as:  ARICEPT Take 5 mg by mouth at bedtime.   esomeprazole 20 MG capsule Commonly known as:  NEXIUM Take 20 mg by mouth daily.   HYDROcodone-acetaminophen 5-325 MG tablet Commonly known as:  NORCO/VICODIN Take 1 tablet by mouth every 6 (six) hours as needed for moderate pain.   losartan 50 MG tablet Commonly known as:  COZAAR Take by mouth.   mirtazapine 30 MG tablet Commonly known as:  REMERON Take 30 mg by mouth at bedtime.   MULTI-VITAMINS Tabs Take 1 tablet by mouth daily.   nitroGLYCERIN 0.4 MG SL tablet Commonly known as:  NITROSTAT Place 1 tablet (0.4 mg total) under the tongue every 5 (five) minutes as needed for chest pain.   pantoprazole 40 MG tablet Commonly known as:  PROTONIX Take 40 mg by mouth 2 (two) times daily.   PREVAGEN 10 MG Caps Generic drug:  Apoaequorin Take 1 capsule by mouth daily.   simvastatin 20 MG tablet Commonly known as:  ZOCOR Take 20 mg by mouth daily.   sucralfate 1 g tablet Commonly known as:  CARAFATE Take 1 tablet (1 g total) by mouth 3 (three) times daily.   vitamin B-12 1000 MCG tablet Commonly known as:  CYANOCOBALAMIN Take 2 tablets by mouth daily.       Allergies:  Allergies  Allergen Reactions  . Donepezil   . Doxazosin Hives  . Trazodone And Nefazodone Other (See Comments)    shivers  . Hctz  [Hydrochlorothiazide] Rash    blisters    Family History: Family History  Problem Relation Age of Onset  . Varicose Veins Mother   . Heart disease Father     Social History:  reports that he has been smoking cigarettes. He has been smoking about 1.00 pack per day. He has never used smokeless tobacco. He reports that he does not drink alcohol or use drugs.  ROS: UROLOGY Frequent Urination?: Yes Hard to postpone urination?: Yes Burning/pain with urination?: No Get up at night to urinate?: Yes Leakage of urine?: Yes Urine stream starts and stops?: Yes Trouble starting stream?: Yes Do you have to strain to urinate?: Yes Blood in urine?: No Urinary tract infection?: No Sexually transmitted disease?: No Injury to kidneys or bladder?: No Painful intercourse?: No Weak stream?: Yes Erection problems?: Yes Penile pain?: No  Gastrointestinal Nausea?: No Vomiting?: No Indigestion/heartburn?: Yes Diarrhea?: No Constipation?: No  Constitutional Fever: No Night sweats?: No Weight loss?: No Fatigue?: Yes  Skin Skin rash/lesions?: Yes Itching?: No  Eyes Blurred vision?: No Double vision?: No  Ears/Nose/Throat Sore throat?: Yes Sinus problems?: Yes  Hematologic/Lymphatic Swollen glands?: No Easy bruising?: Yes  Cardiovascular Leg swelling?: Yes Chest pain?: Yes  Respiratory Cough?: Yes Shortness of breath?: Yes  Endocrine Excessive thirst?: No  Musculoskeletal Back pain?: Yes Joint pain?: Yes  Neurological Headaches?: Yes Dizziness?: No  Psychologic Depression?: Yes Anxiety?: Yes  Physical Exam: BP (!) 157/87   Pulse 72   Ht 5\' 10"  (1.778 m)   Wt 177 lb 3.2 oz (80.4 kg)   BMI 25.43 kg/m   Constitutional:  Alert and oriented, No acute distress.  In wheelchair.  Accompanied by wife. HEENT: [place] AT, moist mucus membranes.  Trachea midline, no masses. Cardiovascular: No clubbing, cyanosis, or edema. Respiratory: Normal respiratory effort, no  increased work of breathing. Skin:  No rashes, bruises or suspicious lesions. Neurologic: Grossly intact, no focal deficits, moving all 4 extremities. Psychiatric: Normal mood and affect.  Laboratory Data: Lab Results  Component Value Date   WBC 6.2 03/31/2017   HGB 17.1 03/31/2017   HCT 49.9 03/31/2017   MCV 100.3 (H) 03/31/2017   PLT 109 (L) 03/31/2017    Lab Results  Component Value Date   CREATININE 1.00 11/25/2015    PSA as above  Urinalysis    Component Value Date/Time   COLORURINE YELLOW (A) 02/11/2015 2152   APPEARANCEUR Clear 01/10/2018 1505   LABSPEC 1.025 02/11/2015 2152   PHURINE 5.0 02/11/2015 2152   GLUCOSEU Negative 01/10/2018 1505   HGBUR 2+ (A) 02/11/2015 2152   BILIRUBINUR Negative 01/10/2018 1505   KETONESUR TRACE (A) 02/11/2015 2152   PROTEINUR 2+ (A) 01/10/2018 1505   PROTEINUR 30 (A) 02/11/2015 2152   NITRITE Negative 01/10/2018 1505   NITRITE NEGATIVE 02/11/2015 2152   LEUKOCYTESUR Trace (A) 01/10/2018 1505    Lab Results  Component Value Date   LABMICR Comment 01/10/2018   WBCUA 0-5 01/10/2018   RBCUA >30 (A) 01/10/2018   LABEPIT None seen 01/10/2018   MUCUS Present (A) 04/12/2017   BACTERIA None seen 01/10/2018    Pertinent Imaging: KUB personally reviewed today, he has a stable barbell shaped left-sided 12 mm stone as well as a punctate stone in the right.  Unchanged from previous CT scan would not have a second person performed.  Results for orders placed or performed in visit on 01/10/18  Microscopic Examination  Result Value Ref Range   WBC, UA 0-5 0 - 5 /hpf   RBC, UA >30 (A) 0 - 2 /hpf   Epithelial Cells (non renal) None seen 0 - 10 /hpf   Casts Present (A) None seen /lpf   Cast Type Hyaline casts N/A   Bacteria, UA None seen None seen/Few  Urinalysis, Complete  Result Value Ref Range   Specific Gravity, UA 1.025 1.005 - 1.030   pH, UA 5.5 5.0 - 7.5   Color, UA Yellow Yellow   Appearance Ur Clear Clear   Leukocytes, UA  Trace (A) Negative   Protein, UA 2+ (A) Negative/Trace   Glucose, UA Negative Negative   Ketones, UA Negative Negative   RBC, UA 3+ (A) Negative   Bilirubin, UA Negative Negative   Urobilinogen, Ur 0.2 0.2 - 1.0 mg/dL   Nitrite, UA Negative Negative   Microscopic Examination Comment    Microscopic Examination See below:   Bladder Scan (Post Void Residual) in office  Result Value Ref Range   Scan Result 32     Assessment & Plan:    1. BPH with obstruction/lower urinary tract symptoms Symptoms poorly controlled, see #4 Likely multifactorial including history of Parkinson's, BPH, recent radiation, etc. Good bladder emptying today - Urinalysis, Complete - Bladder Scan (Post Void Residual) in office  2. Prostate cancer ([place]) Status post IMRT with Lupron We discussed technically, his risk category is intermediate risk, albeit high volume disease We discussed the risks and benefits of continuing ADT versus stopping given that its been ~10 months This point time, we will hold off on further ADT and resume if his PSA begins to climb He will be seeing Dr. Donella Stade in a few months with repeat PSA Follow-up with me in 6 months  3. Kidney stone on left side Asymptomatic stable large left nonobstructing stone, approximately 12 mm He continues to desire conservative management, will reimage in 1 year  with KUB Minimal change in the past year is reassuring  4. OAB (overactive bladder) Refractory urgency frequency Continue Flomax He was given samples of Myrbetriq 25 mg x 1 month today and will get back to [place] if he finds that this is helpful for his urgency frequency symptoms  5. Microscopic hematuria Persistent microscopic hematuria status post unremarkable cystoscopy We will continue to follow and if persists, consider repeat cystoscopy at the 2-year interval (01/2019) Possibly related elated to large kidney stone    Return in about 6 months (around 07/12/2018) for PSA/ IPSS/  PVR.  Hollice Espy, MD   I spent 25 min with this patient of which greater than 50% was spent in counseling and coordination of care with the patient.    [place] [address], [place], [place] [address] [phone]

## 2018-01-19 ENCOUNTER — Emergency Department (HOSPITAL_COMMUNITY): Payer: Medicare Other

## 2018-01-19 ENCOUNTER — Encounter (HOSPITAL_COMMUNITY): Payer: Self-pay | Admitting: Emergency Medicine

## 2018-01-19 ENCOUNTER — Emergency Department (HOSPITAL_COMMUNITY)
Admission: EM | Admit: 2018-01-19 | Discharge: 2018-01-19 | Disposition: A | Discharge status: Home / Self Care | Payer: Medicare Other | Attending: Emergency Medicine | Admitting: Emergency Medicine

## 2018-01-19 DIAGNOSIS — S50311A Abrasion of right elbow, initial encounter: Secondary | ICD-10-CM | POA: Insufficient documentation

## 2018-01-19 DIAGNOSIS — Y929 Unspecified place or not applicable: Secondary | ICD-10-CM | POA: Diagnosis not present

## 2018-01-19 DIAGNOSIS — R079 Chest pain, unspecified: Secondary | ICD-10-CM | POA: Diagnosis not present

## 2018-01-19 DIAGNOSIS — Y998 Other external cause status: Secondary | ICD-10-CM | POA: Insufficient documentation

## 2018-01-19 DIAGNOSIS — T07XXXA Unspecified multiple injuries, initial encounter: Secondary | ICD-10-CM

## 2018-01-19 DIAGNOSIS — S471XXA Crushing injury of right shoulder and upper arm, initial encounter: Secondary | ICD-10-CM

## 2018-01-19 DIAGNOSIS — I714 Abdominal aortic aneurysm, without rupture: Secondary | ICD-10-CM | POA: Insufficient documentation

## 2018-01-19 DIAGNOSIS — S0081XA Abrasion of other part of head, initial encounter: Secondary | ICD-10-CM | POA: Diagnosis not present

## 2018-01-19 DIAGNOSIS — S59901A Unspecified injury of right elbow, initial encounter: Secondary | ICD-10-CM

## 2018-01-19 DIAGNOSIS — W19XXXA Unspecified fall, initial encounter: Secondary | ICD-10-CM

## 2018-01-19 DIAGNOSIS — S4991XA Unspecified injury of right shoulder and upper arm, initial encounter: Secondary | ICD-10-CM | POA: Diagnosis present

## 2018-01-19 DIAGNOSIS — Z23 Encounter for immunization: Secondary | ICD-10-CM | POA: Insufficient documentation

## 2018-01-19 DIAGNOSIS — Y9389 Activity, other specified: Secondary | ICD-10-CM | POA: Diagnosis not present

## 2018-01-19 DIAGNOSIS — W231XXA Caught, crushed, jammed, or pinched between stationary objects, initial encounter: Secondary | ICD-10-CM | POA: Insufficient documentation

## 2018-01-19 HISTORY — DX: Unspecified dementia, unspecified severity, without behavioral disturbance, psychotic disturbance, mood disturbance, and anxiety: F03.90

## 2018-01-19 LAB — ETHANOL

## 2018-01-19 LAB — URINALYSIS, ROUTINE W REFLEX MICROSCOPIC
Bilirubin Urine: NEGATIVE
GLUCOSE, UA: NEGATIVE mg/dL
KETONES UR: 5 mg/dL — AB
LEUKOCYTES UA: NEGATIVE
Nitrite: NEGATIVE
PH: 5 (ref 5.0–8.0)
Protein, ur: 30 mg/dL — AB
Specific Gravity, Urine: 1.02 (ref 1.005–1.030)

## 2018-01-19 LAB — CBC
HCT: 45.8 % (ref 39.0–52.0)
HEMOGLOBIN: 15.1 g/dL (ref 13.0–17.0)
MCH: 33 pg (ref 26.0–34.0)
MCHC: 33 g/dL (ref 30.0–36.0)
MCV: 100 fL (ref 80.0–100.0)
Platelets: 124 10*3/uL — ABNORMAL LOW (ref 150–400)
RBC: 4.58 MIL/uL (ref 4.22–5.81)
RDW: 12.6 % (ref 11.5–15.5)
WBC: 8.2 10*3/uL (ref 4.0–10.5)
nRBC: 0 % (ref 0.0–0.2)

## 2018-01-19 LAB — COMPREHENSIVE METABOLIC PANEL
ALBUMIN: 4.1 g/dL (ref 3.5–5.0)
ALT: 19 U/L (ref 0–44)
ANION GAP: 10 (ref 5–15)
AST: 29 U/L (ref 15–41)
Alkaline Phosphatase: 70 U/L (ref 38–126)
BUN: 14 mg/dL (ref 8–23)
CHLORIDE: 103 mmol/L (ref 98–111)
CO2: 26 mmol/L (ref 22–32)
Calcium: 9.8 mg/dL (ref 8.9–10.3)
Creatinine, Ser: 1.13 mg/dL (ref 0.61–1.24)
GFR calc Af Amer: >60 mL/min (ref >60)
GFR calc non Af Amer: >60 mL/min (ref >60)
GLUCOSE: 95 mg/dL (ref 70–99)
POTASSIUM: 4.1 mmol/L (ref 3.5–5.1)
SODIUM: 139 mmol/L (ref 135–145)
TOTAL PROTEIN: 7.1 g/dL (ref 6.5–8.1)
Total Bilirubin: 1 mg/dL (ref 0.3–1.2)

## 2018-01-19 LAB — SAMPLE TO BLOOD BANK

## 2018-01-19 LAB — I-STAT CHEM 8, ED
BUN: 17 mg/dL (ref 8–23)
CALCIUM ION: 1.15 mmol/L (ref 1.15–1.40)
Chloride: 103 mmol/L (ref 98–111)
Creatinine, Ser: 1.1 mg/dL (ref 0.61–1.24)
Glucose, Bld: 95 mg/dL (ref 70–99)
HCT: 45 % (ref 39.0–52.0)
HEMOGLOBIN: 15.3 g/dL (ref 13.0–17.0)
Potassium: 4.1 mmol/L (ref 3.5–5.1)
SODIUM: 139 mmol/L (ref 135–145)
TCO2: 27 mmol/L (ref 22–32)

## 2018-01-19 LAB — I-STAT CG4 LACTIC ACID, ED: Lactic Acid, Venous: 1.35 mmol/L (ref 0.5–1.9)

## 2018-01-19 LAB — PROTIME-INR
INR: 1.22
PROTHROMBIN TIME: 15.3 s — AB (ref 11.4–15.2)

## 2018-01-19 LAB — CDS SEROLOGY

## 2018-01-19 MED ORDER — IOHEXOL 300 MG/ML  SOLN
100.0000 mL | Freq: Once | INTRAMUSCULAR | Status: AC | PRN
  Administered 2018-01-19: 100 mL via INTRAVENOUS

## 2018-01-19 MED ORDER — FENTANYL CITRATE (PF) 100 MCG/2ML IJ SOLN
INTRAMUSCULAR | Status: AC
  Filled 2018-01-19: qty 2

## 2018-01-19 MED ORDER — OXYCODONE-ACETAMINOPHEN 5-325 MG PO TABS: 1.0000 | ORAL_TABLET | ORAL | 0 refills | Status: DC | PRN

## 2018-01-19 MED ORDER — CEPHALEXIN 500 MG PO CAPS: 500.0000 mg | ORAL_CAPSULE | Freq: Two times a day (BID) | ORAL | 0 refills | Status: DC

## 2018-01-19 MED ORDER — TETANUS-DIPHTH-ACELL PERTUSSIS 5-2.5-18.5 LF-MCG/0.5 IM SUSP
INTRAMUSCULAR | Status: AC
  Filled 2018-01-19: qty 0.5

## 2018-01-19 MED ORDER — TETANUS-DIPHTH-ACELL PERTUSSIS 5-2.5-18.5 LF-MCG/0.5 IM SUSP
0.5000 mL | Freq: Once | INTRAMUSCULAR | Status: AC
  Administered 2018-01-19: 0.5 mL via INTRAMUSCULAR

## 2018-01-19 MED ORDER — FENTANYL CITRATE (PF) 100 MCG/2ML IJ SOLN
50.0000 ug | Freq: Once | INTRAMUSCULAR | Status: AC
  Administered 2018-01-19: 50 ug via INTRAVENOUS

## 2018-01-19 NOTE — Progress Notes (Signed)
I responded to a Level 2 page from the ED. I arrived in the ED and remained present while the medical team worked with the patient. No family members were identified. I remained available to provide spiritual support as needed or requested.    01/19/18 1830  Clinical Encounter Type  Visited With Patient not available  Visit Type Spiritual support;ED  Referral From Nurse  Consult/Referral To Chaplain     Chaplain Dr Redgie Grayer
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
## 2018-01-19 NOTE — ED Notes (Signed)
ED Provider at bedside. 
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
## 2018-01-19 NOTE — Discharge Instructions (Addendum)
Your work-up today showed soft tissue skin tears and injuries to your elbow and on your hands however we did not find any evidence of fracture or dislocation.  The imaging of her torso did not show any fracture dislocations.  No evidence of organ injuries.  We did see the slightly enlarged aorta which you need to follow-up with your primary physician about and likely have an ultrasound performed in 3 years for surveillance.  Please watch for signs and symptoms of infection and take the antibiotics as we prescribed.  We update your tetanus shot.  If any symptoms change or worsen, please return to the nearest emergency department.
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
## 2018-01-19 NOTE — ED Notes (Signed)
Patient verbalizes understanding of discharge instructions. Opportunity for questioning and answers were provided. Armband removed by staff, pt discharged from ED in wheelchair.  

## 2018-01-20 ENCOUNTER — Other Ambulatory Visit: Payer: Self-pay

## 2018-01-20 ENCOUNTER — Encounter (HOSPITAL_COMMUNITY): Payer: Self-pay | Admitting: Emergency Medicine

## 2018-01-20 ENCOUNTER — Emergency Department (HOSPITAL_COMMUNITY)
Admission: EM | Admit: 2018-01-20 | Discharge: 2018-01-21 | Disposition: A | Discharge status: Home / Self Care | Payer: Medicare Other | Attending: Emergency Medicine | Admitting: Emergency Medicine

## 2018-01-20 ENCOUNTER — Encounter: Payer: Self-pay | Admitting: Urology

## 2018-01-20 DIAGNOSIS — Y9389 Activity, other specified: Secondary | ICD-10-CM | POA: Diagnosis not present

## 2018-01-20 DIAGNOSIS — R109 Unspecified abdominal pain: Secondary | ICD-10-CM | POA: Diagnosis not present

## 2018-01-20 DIAGNOSIS — Y999 Unspecified external cause status: Secondary | ICD-10-CM | POA: Insufficient documentation

## 2018-01-20 DIAGNOSIS — Z7982 Long term (current) use of aspirin: Secondary | ICD-10-CM | POA: Insufficient documentation

## 2018-01-20 DIAGNOSIS — Z79899 Other long term (current) drug therapy: Secondary | ICD-10-CM | POA: Insufficient documentation

## 2018-01-20 DIAGNOSIS — W010XXD Fall on same level from slipping, tripping and stumbling without subsequent striking against object, subsequent encounter: Secondary | ICD-10-CM | POA: Insufficient documentation

## 2018-01-20 DIAGNOSIS — S40811D Abrasion of right upper arm, subsequent encounter: Secondary | ICD-10-CM | POA: Insufficient documentation

## 2018-01-20 DIAGNOSIS — G20 Parkinson's disease: Secondary | ICD-10-CM | POA: Diagnosis not present

## 2018-01-20 DIAGNOSIS — Z951 Presence of aortocoronary bypass graft: Secondary | ICD-10-CM | POA: Diagnosis not present

## 2018-01-20 DIAGNOSIS — Y92015 Private garage of single-family (private) house as the place of occurrence of the external cause: Secondary | ICD-10-CM | POA: Diagnosis not present

## 2018-01-20 DIAGNOSIS — I2510 Atherosclerotic heart disease of native coronary artery without angina pectoris: Secondary | ICD-10-CM | POA: Diagnosis not present

## 2018-01-20 DIAGNOSIS — I10 Essential (primary) hypertension: Secondary | ICD-10-CM | POA: Diagnosis not present

## 2018-01-20 DIAGNOSIS — R112 Nausea with vomiting, unspecified: Secondary | ICD-10-CM | POA: Insufficient documentation

## 2018-01-20 DIAGNOSIS — Z8546 Personal history of malignant neoplasm of prostate: Secondary | ICD-10-CM | POA: Insufficient documentation

## 2018-01-20 DIAGNOSIS — R079 Chest pain, unspecified: Secondary | ICD-10-CM | POA: Insufficient documentation

## 2018-01-20 LAB — COMPREHENSIVE METABOLIC PANEL
ALK PHOS: 73 U/L (ref 38–126)
ALT: 6 U/L (ref 0–44)
ANION GAP: 8 (ref 5–15)
AST: 25 U/L (ref 15–41)
Albumin: 4.1 g/dL (ref 3.5–5.0)
BUN: 14 mg/dL (ref 8–23)
CO2: 30 mmol/L (ref 22–32)
Calcium: 10.3 mg/dL (ref 8.9–10.3)
Chloride: 100 mmol/L (ref 98–111)
Creatinine, Ser: 1.35 mg/dL — ABNORMAL HIGH (ref 0.61–1.24)
GFR, EST AFRICAN AMERICAN: 58 mL/min — AB (ref >60)
GFR, EST NON AFRICAN AMERICAN: 50 mL/min — AB (ref >60)
Glucose, Bld: 134 mg/dL — ABNORMAL HIGH (ref 70–99)
POTASSIUM: 4.2 mmol/L (ref 3.5–5.1)
SODIUM: 138 mmol/L (ref 135–145)
Total Bilirubin: 1.3 mg/dL — ABNORMAL HIGH (ref 0.3–1.2)
Total Protein: 7 g/dL (ref 6.5–8.1)

## 2018-01-20 LAB — CBC
HCT: 45.4 % (ref 39.0–52.0)
HEMOGLOBIN: 15.4 g/dL (ref 13.0–17.0)
MCH: 34 pg (ref 26.0–34.0)
MCHC: 33.9 g/dL (ref 30.0–36.0)
MCV: 100.2 fL — AB (ref 80.0–100.0)
Platelets: 134 10*3/uL — ABNORMAL LOW (ref 150–400)
RBC: 4.53 MIL/uL (ref 4.22–5.81)
RDW: 12.6 % (ref 11.5–15.5)
WBC: 11.7 10*3/uL — AB (ref 4.0–10.5)
nRBC: 0 % (ref 0.0–0.2)

## 2018-01-20 LAB — LIPASE, BLOOD: Lipase: 27 U/L (ref 11–51)

## 2018-01-20 MED ORDER — ONDANSETRON 4 MG PO TBDP
4.0000 mg | ORAL_TABLET | Freq: Once | ORAL | Status: AC | PRN
  Administered 2018-01-20: 4 mg via ORAL
  Filled 2018-01-20: qty 1

## 2018-01-20 MED ORDER — ONDANSETRON 4 MG PO TBDP: 4.0000 mg | ORAL_TABLET | Freq: Three times a day (TID) | ORAL | 0 refills | Status: DC | PRN
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
## 2018-01-20 NOTE — ED Notes (Signed)
Bandage changed wound cleaned with soap and water bacitgracin [employer]PLIED   [employer]  [employer]
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
## 2018-01-20 NOTE — Discharge Instructions (Addendum)
Take Zofran as needed for nausea if you should have recurrent symptoms. Return to the ED with any severe pain, high fever, bloody vomiting or new concern.

## 2018-01-21 ENCOUNTER — Other Ambulatory Visit: Payer: Self-pay

## 2018-01-21 ENCOUNTER — Inpatient Hospital Stay
Admission: EM | Admit: 2018-01-21 | Discharge: 2018-01-23 | DRG: 193 | Disposition: A | Discharge status: Home / Self Care | Payer: Medicare Other | Attending: Internal Medicine | Admitting: Internal Medicine

## 2018-01-21 ENCOUNTER — Encounter: Payer: Self-pay | Admitting: Emergency Medicine

## 2018-01-21 ENCOUNTER — Emergency Department: Payer: Medicare Other

## 2018-01-21 DIAGNOSIS — J440 Chronic obstructive pulmonary disease with acute lower respiratory infection: Secondary | ICD-10-CM | POA: Diagnosis present

## 2018-01-21 DIAGNOSIS — I2510 Atherosclerotic heart disease of native coronary artery without angina pectoris: Secondary | ICD-10-CM | POA: Diagnosis present

## 2018-01-21 DIAGNOSIS — Z951 Presence of aortocoronary bypass graft: Secondary | ICD-10-CM | POA: Diagnosis not present

## 2018-01-21 DIAGNOSIS — Z79899 Other long term (current) drug therapy: Secondary | ICD-10-CM

## 2018-01-21 DIAGNOSIS — I129 Hypertensive chronic kidney disease with stage 1 through stage 4 chronic kidney disease, or unspecified chronic kidney disease: Secondary | ICD-10-CM | POA: Diagnosis present

## 2018-01-21 DIAGNOSIS — N189 Chronic kidney disease, unspecified: Secondary | ICD-10-CM | POA: Diagnosis present

## 2018-01-21 DIAGNOSIS — I252 Old myocardial infarction: Secondary | ICD-10-CM | POA: Diagnosis not present

## 2018-01-21 DIAGNOSIS — J181 Lobar pneumonia, unspecified organism: Secondary | ICD-10-CM | POA: Diagnosis not present

## 2018-01-21 DIAGNOSIS — F0280 Dementia in other diseases classified elsewhere without behavioral disturbance: Secondary | ICD-10-CM | POA: Diagnosis present

## 2018-01-21 DIAGNOSIS — J9601 Acute respiratory failure with hypoxia: Secondary | ICD-10-CM | POA: Diagnosis present

## 2018-01-21 DIAGNOSIS — E785 Hyperlipidemia, unspecified: Secondary | ICD-10-CM | POA: Diagnosis present

## 2018-01-21 DIAGNOSIS — J189 Pneumonia, unspecified organism: Secondary | ICD-10-CM | POA: Diagnosis present

## 2018-01-21 DIAGNOSIS — F17200 Nicotine dependence, unspecified, uncomplicated: Secondary | ICD-10-CM | POA: Diagnosis present

## 2018-01-21 DIAGNOSIS — J9811 Atelectasis: Secondary | ICD-10-CM | POA: Diagnosis present

## 2018-01-21 DIAGNOSIS — G20 Parkinson's disease: Secondary | ICD-10-CM | POA: Diagnosis present

## 2018-01-21 DIAGNOSIS — Z8546 Personal history of malignant neoplasm of prostate: Secondary | ICD-10-CM | POA: Diagnosis not present

## 2018-01-21 DIAGNOSIS — Z7982 Long term (current) use of aspirin: Secondary | ICD-10-CM | POA: Diagnosis not present

## 2018-01-21 DIAGNOSIS — R0902 Hypoxemia: Secondary | ICD-10-CM | POA: Diagnosis not present

## 2018-01-21 LAB — ACETAMINOPHEN LEVEL: Acetaminophen (Tylenol), Serum: <10 ug/mL — ABNORMAL LOW (ref 10–30)

## 2018-01-21 LAB — PROCALCITONIN: Procalcitonin: <0.1 ng/mL

## 2018-01-21 LAB — CK: Total CK: 190 U/L (ref 49–397)

## 2018-01-21 LAB — BASIC METABOLIC PANEL WITH GFR
Anion gap: 7 (ref 5–15)
BUN: 28 mg/dL — ABNORMAL HIGH (ref 8–23)
CO2: 29 mmol/L (ref 22–32)
Calcium: 9.1 mg/dL (ref 8.9–10.3)
Chloride: 101 mmol/L (ref 98–111)
Creatinine, Ser: 1.55 mg/dL — ABNORMAL HIGH (ref 0.61–1.24)
GFR calc Af Amer: 49 mL/min — ABNORMAL LOW (ref >60)
GFR calc non Af Amer: 42 mL/min — ABNORMAL LOW (ref >60)
Glucose, Bld: 114 mg/dL — ABNORMAL HIGH (ref 70–99)
Potassium: 4.3 mmol/L (ref 3.5–5.1)
Sodium: 137 mmol/L (ref 135–145)

## 2018-01-21 LAB — CBC WITH DIFFERENTIAL/PLATELET
Abs Immature Granulocytes: 0.07 10*3/uL (ref 0.00–0.07)
Basophils Absolute: 0.1 10*3/uL (ref 0.0–0.1)
Basophils Relative: 1 %
EOS PCT: 1 %
Eosinophils Absolute: 0.2 10*3/uL (ref 0.0–0.5)
HEMATOCRIT: 40.4 % (ref 39.0–52.0)
Hemoglobin: 13.9 g/dL (ref 13.0–17.0)
Immature Granulocytes: 1 %
LYMPHS ABS: 1.2 10*3/uL (ref 0.7–4.0)
LYMPHS PCT: 11 %
MCH: 34.5 pg — AB (ref 26.0–34.0)
MCHC: 34.4 g/dL (ref 30.0–36.0)
MCV: 100.2 fL — AB (ref 80.0–100.0)
MONO ABS: 0.8 10*3/uL (ref 0.1–1.0)
Monocytes Relative: 7 %
Neutro Abs: 8.5 10*3/uL — ABNORMAL HIGH (ref 1.7–7.7)
Neutrophils Relative %: 79 %
Platelets: 130 10*3/uL — ABNORMAL LOW (ref 150–400)
RBC: 4.03 MIL/uL — ABNORMAL LOW (ref 4.22–5.81)
RDW: 12.7 % (ref 11.5–15.5)
WBC: 10.8 10*3/uL — ABNORMAL HIGH (ref 4.0–10.5)
nRBC: 0 % (ref 0.0–0.2)

## 2018-01-21 LAB — URINALYSIS, ROUTINE W REFLEX MICROSCOPIC
BILIRUBIN URINE: NEGATIVE
Glucose, UA: NEGATIVE mg/dL
KETONES UR: 20 mg/dL — AB
Leukocytes, UA: NEGATIVE
NITRITE: NEGATIVE
PH: 5 (ref 5.0–8.0)
Protein, ur: 100 mg/dL — AB
SPECIFIC GRAVITY, URINE: 1.039 — AB (ref 1.005–1.030)

## 2018-01-21 LAB — LACTIC ACID, PLASMA: Lactic Acid, Venous: 1.5 mmol/L (ref 0.5–1.9)

## 2018-01-21 MED ORDER — SODIUM CHLORIDE 0.9 % IV SOLN: 500.0000 mg | Freq: Once | INTRAVENOUS | Status: DC

## 2018-01-21 MED ORDER — ONDANSETRON HCL 4 MG/2ML IJ SOLN: 4.0000 mg | Freq: Four times a day (QID) | INTRAMUSCULAR | Status: DC | PRN

## 2018-01-21 MED ORDER — NALOXONE HCL 2 MG/2ML IJ SOSY: 0.4000 mg | PREFILLED_SYRINGE | Freq: Once | INTRAMUSCULAR | Status: DC

## 2018-01-21 MED ORDER — ACETAMINOPHEN 650 MG RE SUPP: 650.0000 mg | Freq: Four times a day (QID) | RECTAL | Status: DC | PRN

## 2018-01-21 MED ORDER — LEVOFLOXACIN IN D5W 250 MG/50ML IV SOLN
250.0000 mg | INTRAVENOUS | Status: DC
  Filled 2018-01-21: qty 50

## 2018-01-21 MED ORDER — NALOXONE HCL 2 MG/2ML IJ SOSY
0.4000 mg | PREFILLED_SYRINGE | Freq: Once | INTRAMUSCULAR | Status: AC
  Administered 2018-01-21: 0.4 mg via INTRAVENOUS

## 2018-01-21 MED ORDER — NALOXONE HCL 2 MG/2ML IJ SOSY
PREFILLED_SYRINGE | INTRAMUSCULAR | Status: AC
  Administered 2018-01-21: 0.5 mg via INTRAVENOUS
  Filled 2018-01-21: qty 2

## 2018-01-21 MED ORDER — TRAMADOL HCL 50 MG PO TABS: 50.0000 mg | ORAL_TABLET | Freq: Four times a day (QID) | ORAL | Status: DC | PRN

## 2018-01-21 MED ORDER — IBUPROFEN 400 MG PO TABS: 400.0000 mg | ORAL_TABLET | Freq: Four times a day (QID) | ORAL | Status: DC | PRN

## 2018-01-21 MED ORDER — SODIUM CHLORIDE 0.9 % IV BOLUS
1000.0000 mL | Freq: Once | INTRAVENOUS | Status: AC
  Administered 2018-01-21: 1000 mL via INTRAVENOUS

## 2018-01-21 MED ORDER — SODIUM CHLORIDE 0.9 % IV SOLN
INTRAVENOUS | Status: DC
  Administered 2018-01-21: 23:00:00 via INTRAVENOUS

## 2018-01-21 MED ORDER — ONDANSETRON HCL 4 MG PO TABS: 4.0000 mg | ORAL_TABLET | Freq: Four times a day (QID) | ORAL | Status: DC | PRN

## 2018-01-21 MED ORDER — IPRATROPIUM-ALBUTEROL 0.5-2.5 (3) MG/3ML IN SOLN
3.0000 mL | Freq: Four times a day (QID) | RESPIRATORY_TRACT | Status: DC
  Administered 2018-01-21 – 2018-01-22 (×4): 3 mL via RESPIRATORY_TRACT
  Filled 2018-01-21 (×4): qty 3

## 2018-01-21 MED ORDER — IPRATROPIUM-ALBUTEROL 0.5-2.5 (3) MG/3ML IN SOLN
3.0000 mL | Freq: Once | RESPIRATORY_TRACT | Status: AC
  Administered 2018-01-21: 3 mL via RESPIRATORY_TRACT
  Filled 2018-01-21: qty 3

## 2018-01-21 MED ORDER — LEVOFLOXACIN IN D5W 500 MG/100ML IV SOLN
500.0000 mg | Freq: Once | INTRAVENOUS | Status: AC
  Administered 2018-01-22: 500 mg via INTRAVENOUS
  Filled 2018-01-21: qty 100

## 2018-01-21 MED ORDER — NICOTINE 14 MG/24HR TD PT24
14.0000 mg | MEDICATED_PATCH | Freq: Once | TRANSDERMAL | Status: AC
  Administered 2018-01-21: 14 mg via TRANSDERMAL
  Filled 2018-01-21: qty 1

## 2018-01-21 MED ORDER — SODIUM CHLORIDE 0.9 % IV SOLN
2.0000 g | Freq: Once | INTRAVENOUS | Status: DC
  Administered 2018-01-21: 2 g via INTRAVENOUS
  Filled 2018-01-21: qty 20

## 2018-01-21 MED ORDER — ENOXAPARIN SODIUM 40 MG/0.4ML ~~LOC~~ SOLN
40.0000 mg | SUBCUTANEOUS | Status: DC
  Administered 2018-01-21 – 2018-01-22 (×2): 40 mg via SUBCUTANEOUS
  Filled 2018-01-21 (×2): qty 0.4

## 2018-01-21 MED ORDER — ACETAMINOPHEN 325 MG PO TABS: 650.0000 mg | ORAL_TABLET | Freq: Four times a day (QID) | ORAL | Status: DC | PRN

## 2018-01-21 MED ORDER — ALBUTEROL SULFATE (2.5 MG/3ML) 0.083% IN NEBU: 2.5000 mg | INHALATION_SOLUTION | RESPIRATORY_TRACT | Status: DC | PRN

## 2018-01-21 MED ORDER — NALOXONE HCL 2 MG/2ML IJ SOSY
0.4000 mg | PREFILLED_SYRINGE | INTRAMUSCULAR | Status: DC | PRN
  Filled 2018-01-21: qty 2

## 2018-01-21 MED ORDER — METHYLPREDNISOLONE SODIUM SUCC 125 MG IJ SOLR
125.0000 mg | INTRAMUSCULAR | Status: AC
  Administered 2018-01-21: 125 mg via INTRAVENOUS
  Filled 2018-01-21: qty 2

## 2018-01-21 MED ORDER — POLYETHYLENE GLYCOL 3350 17 G PO PACK: 17.0000 g | PACK | Freq: Every day | ORAL | Status: DC | PRN

## 2018-01-21 MED ORDER — NALOXONE HCL 2 MG/2ML IJ SOSY
0.5000 mg | PREFILLED_SYRINGE | Freq: Once | INTRAMUSCULAR | Status: AC
  Administered 2018-01-21: 0.5 mg via INTRAVENOUS

## 2018-01-21 NOTE — ED Notes (Signed)
Attempted to call report to floor. Primary RN unavailable at this time.  
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
## 2018-01-21 NOTE — Progress Notes (Signed)
Pharmacy Antibiotic Note  Douglas Clark is a 77 y.o. male admitted on 01/21/2018 with pneumonia.  Pharmacy has been consulted for Levaquin dosing.  Plan: Will order levaquin 500 mg IV X 1 to be given on 11/2 @ 2000 followed by levaquin 250 mg IV Q24H to start on 11/3 @ 2000.  Weight: 175 lb 14.8 oz (79.8 kg)  Temp (24hrs), Avg:97.7 F (36.5 C), Min:97.7 F (36.5 C), Max:97.7 F (36.5 C)  Recent Labs  Lab 01/19/18 1839 01/19/18 1847 01/20/18 1718 01/21/18 1742 01/21/18 1743 01/21/18 1843  WBC 8.2  --  11.7* 10.8*  --   --   CREATININE 1.13 1.10 1.35*  --   --  1.55*  LATICACIDVEN  --  1.35  --   --  1.5  --     Estimated Creatinine Clearance: 42.5 mL/min (A) (by C-G formula based on SCr of 1.55 mg/dL (H)).    Allergies  Allergen Reactions  . Donepezil   . Doxazosin Hives  . Trazodone And Nefazodone Other (See Comments)    shivers  . Hctz [Hydrochlorothiazide] Rash    blisters    Antimicrobials this admission:   >>    >>   Dose adjustments this admission:   Microbiology results:  BCx:   UCx:    Sputum:    MRSA PCR:   Thank you for allowing pharmacy to be a part of this patient's care.  Douglas Clark D 01/21/2018 8:13 PM

## 2018-01-21 NOTE — ED Notes (Signed)
Admitting MD at bedside.
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
## 2018-01-21 NOTE — Progress Notes (Signed)
Advance care planning  Purpose of Encounter Pneumonia and CODE STATUS discussion  Parties in Attendance Patient and wife at bedside  Patients Decisional capacity Patient is alert and oriented.  Able to make medical decisions.  Has mild cognitive impairment.  Discussed with patient and wife at bedside regarding pneumonia, treatment plan and prognosis.  Patient's designated healthcare power of attorney is his wife.  Discussed regarding CODE STATUS.  Initially patient said he does not want intubation or CPR.  Wife wanted him to be a full code.  After discussing among themselves and asking questions regarding CODE STATUS patient has chosen to be a full code without prolonged life support.  Wife is aware.  Full code  Time spent -18 minutes
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
## 2018-01-21 NOTE — ED Notes (Signed)
ED TO INPATIENT HANDOFF REPORT  Name/Age/Gender Douglas Clark 77 y.o. male  Code Status    Code Status Orders  (From admission, onward)         Start     Ordered   01/21/18 1936  Full code  Continuous     01/21/18 1936        Code Status History    Date Active Date Inactive Code Status Order ID Comments User Context   02/04/2015 1715 02/10/2015 1902 Full Code [id number]  Douglas Cha, MD Inpatient      Home/SNF/Other   Chief Complaint altered mental status  Level of Clark/Admitting Diagnosis ED Disposition    ED Disposition Condition [place]: [facility] [[id number]]  Level of Clark: Med-Surg [16]  Diagnosis: Pneumonia [227785]  Admitting Physician: Douglas Clark [[id number]]  Attending Physician: Douglas Clark [[id number]]  Estimated length of stay: past midnight tomorrow  Certification:: I certify this patient will need inpatient services for at least 2 midnights  PT Class (Do Not Modify): Inpatient [101]  PT Acc Code (Do Not Modify): Private [1]       Medical History Past Medical History:  Diagnosis Date  . Anxiety   . Arthritis   . BBB (bundle branch block)    RIGHT AND LEFT  . Cancer [facility])    Prostate Cancer  . Chronic fatigue and malaise    with STM loss  . Chronic kidney disease    stone  . Concussion 1993   with long term memory loss  . Coronary artery disease   . Dementia ([place])   . Diverticulosis   . Emphysema lung ([place])   . GERD (gastroesophageal reflux disease)   . HH (hiatus hernia)   . History of kidney stones   . Hyperlipidemia   . Hypertension   . Myocardial infarction ([place])   . Parkinson disease ([place])   . Weight loss     Allergies Allergies  Allergen Reactions  . Donepezil   . Doxazosin Hives  . Trazodone And Nefazodone Other (See Comments)    shivers  . Hctz [Hydrochlorothiazide] Rash    blisters    IV Location/Drains/Wounds Patient Lines/Drains/Airways Status   Active  Line/Drains/Airways    Name:   Placement date:   Placement time:   Site:   Days:   Peripheral IV 01/21/18 Left Forearm   01/21/18    1730    Forearm   less than 1   Incision (Closed) 02/04/15 Back Other (Comment)   02/04/15    1357     1082          Labs/Imaging Results for orders placed or performed during the Clark encounter of 01/21/18 (from the past 48 hour(s))  CBC with Differential     Status: Abnormal   Collection Time: 01/21/18  5:42 PM  Result Value Ref Range   WBC 10.8 (H) 4.0 - 10.5 K/uL   RBC 4.03 (L) 4.22 - 5.81 MIL/uL   Hemoglobin 13.9 13.0 - 17.0 g/dL   HCT 40.4 39.0 - 52.0 %   MCV 100.2 (H) 80.0 - 100.0 fL   MCH 34.5 (H) 26.0 - 34.0 pg   MCHC 34.4 30.0 - 36.0 g/dL   RDW 12.7 11.5 - 15.5 %   Platelets 130 (L) 150 - 400 K/uL   nRBC 0.0 0.0 - 0.2 %   Neutrophils Relative % 79 %   Neutro Abs 8.5 (H) 1.7 - 7.7 K/uL   Lymphocytes  Relative 11 %   Lymphs Abs 1.2 0.7 - 4.0 K/uL   Monocytes Relative 7 %   Monocytes Absolute 0.8 0.1 - 1.0 K/uL   Eosinophils Relative 1 %   Eosinophils Absolute 0.2 0.0 - 0.5 K/uL   Basophils Relative 1 %   Basophils Absolute 0.1 0.0 - 0.1 K/uL   Immature Granulocytes 1 %   Abs Immature Granulocytes 0.07 0.00 - 0.07 K/uL    Comment: Performed at [facility], [address] [place], [place] [address]  Lactic acid, plasma     Status: None   Collection Time: 01/21/18  5:43 PM  Result Value Ref Range   Lactic Acid, Venous 1.5 0.5 - 1.9 mmol/L    Comment: Performed at [facility], [address], [place], [place] [address]  Acetaminophen level     Status: Abnormal   Collection Time: 01/21/18  6:43 PM  Result Value Ref Range   Acetaminophen (Tylenol), Serum <10 (L) 10 - 30 ug/mL    Comment: (NOTE) Therapeutic concentrations vary significantly. A range of 10-30 ug/mL  may be an effective concentration for many patients. However, some  are best treated at concentrations outside of this range. Acetaminophen  concentrations >150 ug/mL at 4 hours after ingestion  and >50 ug/mL at 12 hours after ingestion are often associated with  toxic reactions. Performed at [facility], [place] [place], [place] [address]   Basic metabolic panel     Status: Abnormal   Collection Time: 01/21/18  6:43 PM  Result Value Ref Range   Sodium 137 135 - 145 mmol/L   Potassium 4.3 3.5 - 5.1 mmol/L   Chloride 101 98 - 111 mmol/L   CO2 29 22 - 32 mmol/L   Glucose, Bld 114 (H) 70 - 99 mg/dL   BUN 28 (H) 8 - 23 mg/dL   Creatinine, Ser 1.55 (H) 0.61 - 1.24 mg/dL   Calcium 9.1 8.9 - 10.3 mg/dL   GFR calc non Af Amer 42 (L) >60 mL/min   GFR calc Af Amer 49 (L) >60 mL/min    Comment: (NOTE) The eGFR has been calculated using the CKD EPI equation. This calculation has not been validated in all clinical situations. eGFR's persistently <60 mL/min signify possible Chronic Kidney Disease.    Anion gap 7 5 - 15    Comment: Performed at [facility], [address] [place], [place] [address]  CK     Status: None   Collection Time: 01/21/18  6:43 PM  Result Value Ref Range   Total CK 190 49 - 397 U/L    Comment: Performed at [facility], [address] [place],  [address]  Blood gas, venous     Status: None (Preliminary result)   Collection Time: 01/21/18  7:23 PM  Result Value Ref Range   pH, Ven 7.32 7.250 - 7.430   pCO2, Ven 49 44.0 - 60.0 mmHg   pO2, Ven PENDING 32.0 - 45.0 mmHg   Bicarbonate 25.2 20.0 - 28.0 mmol/L   Acid-base deficit 1.3 0.0 - 2.0 mmol/L   O2 Saturation 56.8 %   Patient temperature 37.0    Sample type VENOUS     Comment: Performed at [facility], [address] [place] [place],  [address]   Dg Chest 1 View  Result Date: 01/21/2018 CLINICAL DATA:  Shortness of breath. EXAM: CHEST  1 VIEW COMPARISON:  None. FINDINGS: Mild opacity in left base may represent atelectasis versus mild  infiltrate. No pneumothorax. The heart, hila,  mediastinum, lungs, and pleura are otherwise unremarkable. No fractures noted. IMPRESSION: Mild opacity in left base could represent atelectasis or early infiltrate. Recommend clinical correlation and follow-up to resolution. Electronically Signed   By: [facility] M.D   On: 01/21/2018 18:09   Ct Chest W Contrast  Result Date: 01/19/2018 CLINICAL DATA:  Pt states his camper rolled back onto him while unloading it today. Pt c/o left sided chest pain with dyspnea. Denies abdominal pain, denies n/v EXAM: CT CHEST, ABDOMEN, AND PELVIS WITH CONTRAST TECHNIQUE: Multidetector CT imaging of the chest, abdomen and pelvis was performed following the standard protocol during bolus administration of intravenous contrast. CONTRAST:  165m OMNIPAQUE IOHEXOL 300 MG/ML  SOLN COMPARISON:  None. FINDINGS: CT CHEST FINDINGS Cardiovascular: Heart size normal. No pericardial effusion. Coronary calcifications. Previous CABG. Moderate atheromatous plaque in the descending thoracic aorta. No dissection or stenosis. Mediastinum/Nodes: No mediastinal hematoma. No hilar or mediastinal adenopathy. Lungs/Pleura: No pleural effusion. Pulmonary emphysema. No pneumothorax. No focal airspace disease or nodule. Musculoskeletal: Previous median sternotomy. Old healed right eleventh rib fracture. No acute displaced fracture or worrisome bone lesion. CT ABDOMEN PELVIS FINDINGS Hepatobiliary: No focal liver abnormality is seen. No gallstones, gallbladder wall thickening, or biliary dilatation. Pancreas: Unremarkable. No pancreatic ductal dilatation or surrounding inflammatory changes. Spleen: Normal in size without focal abnormality. Adrenals/Urinary Tract: 13 mm hyperdense exophytic lesion from the mid right kidney. Smaller bilateral low-attenuation lesions statistically most likely cysts. No hydronephrosis. 11 mm calcification centrally in the left renal collecting system. Urinary bladder incompletely distended. Stomach/Bowel: Stomach is  nondistended. Small bowel decompressed. Normal appendix. Scattered distal descending and sigmoid diverticula without adjacent inflammatory/edematous change or abscess. Vascular/Lymphatic: Calcified atheromatous plaque in the aorta and iliac arteries. Fusiform infrarenal aortic aneurysm patient up to 3.4 cm, with some eccentric nonocclusive mural thrombus in the aneurysmal segments. Ectatic bilateral common iliac arteries. No abdominal or pelvic adenopathy. Reproductive: Prostate is unremarkable. Other: No ascites.  No free air. Musculoskeletal: Previous posterior lumbar decompression. Multilevel spondylitic change in the lumbar spine. No acute fracture or worrisome bone lesion. IMPRESSION: 1. No acute findings. 2. Aortic Atherosclerosis (ICD10-I70.0) and Emphysema (ICD10-J43.9). 3. 3.4 cm infrarenal abdominal aortic aneurysm. Recommend followup by ultrasound in 3 years. This recommendation follows ACR consensus guidelines: White Paper of the ACR Incidental Findings Committee II on Vascular Findings. J Am Coll Radiol 2013; 107:371-0624. Descending and sigmoid diverticulosis. Electronically Signed   By: DLucrezia EuropeM.D.   On: 01/19/2018 20:59   Ct Abdomen Pelvis W Contrast  Result Date: 01/19/2018 CLINICAL DATA:  Pt states his camper rolled back onto him while unloading it today. Pt c/o left sided chest pain with dyspnea. Denies abdominal pain, denies n/v EXAM: CT CHEST, ABDOMEN, AND PELVIS WITH CONTRAST TECHNIQUE: Multidetector CT imaging of the chest, abdomen and pelvis was performed following the standard protocol during bolus administration of intravenous contrast. CONTRAST:  1039mOMNIPAQUE IOHEXOL 300 MG/ML  SOLN COMPARISON:  None. FINDINGS: CT CHEST FINDINGS Cardiovascular: Heart size normal. No pericardial effusion. Coronary calcifications. Previous CABG. Moderate atheromatous plaque in the descending thoracic aorta. No dissection or stenosis. Mediastinum/Nodes: No mediastinal hematoma. No hilar or  mediastinal adenopathy. Lungs/Pleura: No pleural effusion. Pulmonary emphysema. No pneumothorax. No focal airspace disease or nodule. Musculoskeletal: Previous median sternotomy. Old healed right eleventh rib fracture. No acute displaced fracture or worrisome bone lesion. CT ABDOMEN PELVIS FINDINGS Hepatobiliary: No focal liver abnormality is seen. No gallstones, gallbladder wall thickening, or biliary dilatation.  Pancreas: Unremarkable. No pancreatic ductal dilatation or surrounding inflammatory changes. Spleen: Normal in size without focal abnormality. Adrenals/Urinary Tract: 13 mm hyperdense exophytic lesion from the mid right kidney. Smaller bilateral low-attenuation lesions statistically most likely cysts. No hydronephrosis. 11 mm calcification centrally in the left renal collecting system. Urinary bladder incompletely distended. Stomach/Bowel: Stomach is nondistended. Small bowel decompressed. Normal appendix. Scattered distal descending and sigmoid diverticula without adjacent inflammatory/edematous change or abscess. Vascular/Lymphatic: Calcified atheromatous plaque in the aorta and iliac arteries. Fusiform infrarenal aortic aneurysm patient up to 3.4 cm, with some eccentric nonocclusive mural thrombus in the aneurysmal segments. Ectatic bilateral common iliac arteries. No abdominal or pelvic adenopathy. Reproductive: Prostate is unremarkable. Other: No ascites.  No free air. Musculoskeletal: Previous posterior lumbar decompression. Multilevel spondylitic change in the lumbar spine. No acute fracture or worrisome bone lesion. IMPRESSION: 1. No acute findings. 2. Aortic Atherosclerosis (ICD10-I70.0) and Emphysema (ICD10-J43.9). 3. 3.4 cm infrarenal abdominal aortic aneurysm. Recommend followup by ultrasound in 3 years. This recommendation follows ACR consensus guidelines: White Paper of the ACR Incidental Findings Committee II on Vascular Findings. J Am Coll Radiol 2013; 16:109-604 4. Descending and sigmoid  diverticulosis. Electronically Signed   By: Lucrezia Europe M.D.   On: 01/19/2018 20:59    Pending Labs Unresulted Labs (From admission, onward)    Start     Ordered   01/28/18 0500  Creatinine, serum  (enoxaparin (LOVENOX)    CrCl >/= 30 ml/min)  Weekly,   STAT    Comments:  while on enoxaparin therapy    01/21/18 1936   01/22/18 5409  Basic metabolic panel  Tomorrow morning,   STAT     01/21/18 1936   01/22/18 0500  CBC  Tomorrow morning,   STAT     01/21/18 1936   01/21/18 1939  Procalcitonin - Baseline  Add-on,   AD     01/21/18 1938   01/21/18 1939  Culture, sputum-assessment  Once,   STAT     01/21/18 1939   01/21/18 1939  Gram stain  Once,   STAT     01/21/18 1939   01/21/18 1939  Strep pneumoniae urinary antigen  Once,   STAT     01/21/18 1939   01/21/18 1741  Blood culture (routine x 2)  BLOOD CULTURE X 2,   STAT     01/21/18 1740   01/21/18 1741  Lactic acid, plasma  Now then every 2 hours,   STAT     01/21/18 1740          Vitals/Pain Today's Vitals   01/21/18 1900 01/21/18 1912 01/21/18 1930 01/21/18 2000  BP: (!) 90/50 (!) 91/54  (!) 103/56  Pulse: 76 76 75 75  Resp: '19 20 20 ' (!) 21  SpO2: 95% 91% 92% 95%  Weight:      PainSc:        Isolation Precautions No active isolations  Medications Medications  nicotine (NICODERM CQ - dosed in mg/24 hours) patch 14 mg (14 mg Transdermal Patch Applied 01/21/18 1917)  sodium chloride 0.9 % bolus 1,000 mL (1,000 mLs Intravenous [facility]/Given 01/21/18 1927)  enoxaparin (LOVENOX) injection 40 mg (has no administration in time range)  acetaminophen (TYLENOL) tablet 650 mg (has no administration in time range)    Or  acetaminophen (TYLENOL) suppository 650 mg (has no administration in time range)  polyethylene glycol (MIRALAX / GLYCOLAX) packet 17 g (has no administration in time range)  ondansetron (ZOFRAN) tablet 4 mg (has no administration in  time range)    Or  ondansetron (ZOFRAN) injection 4 mg (has no administration  in time range)  albuterol (PROVENTIL) (2.5 MG/3ML) 0.083% nebulizer solution 2.5 mg (has no administration in time range)  ipratropium-albuterol (DUONEB) 0.5-2.5 (3) MG/3ML nebulizer solution 3 mL (has no administration in time range)  naloxone [facility]) injection 0.4 mg (has no administration in time range)  ibuprofen (ADVIL,MOTRIN) tablet 400 mg (has no administration in time range)  0.9 %  sodium chloride infusion (has no administration in time range)  naloxone [facility]) injection 0.5 mg (0.5 mg Intravenous Given 01/21/18 1736)  ipratropium-albuterol (DUONEB) 0.5-2.5 (3) MG/3ML nebulizer solution 3 mL (3 mLs Nebulization Given 01/21/18 1919)  methylPREDNISolone sodium succinate (SOLU-MEDROL) 125 mg/2 mL injection 125 mg (125 mg Intravenous Given 01/21/18 1915)  naloxone (NARCAN) injection 0.4 mg (0.4 mg Intravenous Given 01/21/18 1935)    Mobility Bed bound as of today per wife; formerly ambulated with cane or walker.

## 2018-01-22 ENCOUNTER — Inpatient Hospital Stay: Payer: Medicare Other

## 2018-01-22 LAB — BASIC METABOLIC PANEL
Anion gap: 8 (ref 5–15)
BUN: 27 mg/dL — ABNORMAL HIGH (ref 8–23)
CALCIUM: 8.8 mg/dL — AB (ref 8.9–10.3)
CO2: 27 mmol/L (ref 22–32)
Chloride: 102 mmol/L (ref 98–111)
Creatinine, Ser: 1.19 mg/dL (ref 0.61–1.24)
GFR calc non Af Amer: 58 mL/min — ABNORMAL LOW (ref >60)
Glucose, Bld: 135 mg/dL — ABNORMAL HIGH (ref 70–99)
POTASSIUM: 4.2 mmol/L (ref 3.5–5.1)
Sodium: 137 mmol/L (ref 135–145)

## 2018-01-22 LAB — CBC
HCT: 38.2 % — ABNORMAL LOW (ref 39.0–52.0)
HEMOGLOBIN: 12.8 g/dL — AB (ref 13.0–17.0)
MCH: 33.9 pg (ref 26.0–34.0)
MCHC: 33.5 g/dL (ref 30.0–36.0)
MCV: 101.1 fL — AB (ref 80.0–100.0)
NRBC: 0 % (ref 0.0–0.2)
PLATELETS: 111 10*3/uL — AB (ref 150–400)
RBC: 3.78 MIL/uL — ABNORMAL LOW (ref 4.22–5.81)
RDW: 12.7 % (ref 11.5–15.5)
WBC: 6.7 10*3/uL (ref 4.0–10.5)

## 2018-01-22 MED ORDER — LEVOFLOXACIN IN D5W 500 MG/100ML IV SOLN
500.0000 mg | INTRAVENOUS | Status: DC
  Administered 2018-01-22: 500 mg via INTRAVENOUS
  Filled 2018-01-22 (×2): qty 100

## 2018-01-22 NOTE — Plan of Care (Signed)

## 2018-01-22 NOTE — Progress Notes (Signed)
Pharmacy Antibiotic Note  Douglas Clark is a 77 y.o. male admitted on 01/21/2018 with pneumonia.  Pharmacy has been consulted for Levaquin dosing.  Plan: Renal function has improved. Will adjust order to levaquin 500 mg IV every 24 hours.   Weight: 184 lb 15.5 oz (83.9 kg)  Temp (24hrs), Avg:98.4 F (36.9 C), Min:98 F (36.7 C), Max:98.8 F (37.1 C)  Recent Labs  Lab 01/19/18 1839 01/19/18 1847 01/20/18 1718 01/21/18 1742 01/21/18 1743 01/21/18 1843 01/22/18 0421  WBC 8.2  --  11.7* 10.8*  --   --  6.7  CREATININE 1.13 1.10 1.35*  --   --  1.55* 1.19  LATICACIDVEN  --  1.35  --   --  1.5  --   --     Estimated Creatinine Clearance: 55.4 mL/min (by C-G formula based on SCr of 1.19 mg/dL).    Allergies  Allergen Reactions  . Donepezil   . Doxazosin Hives  . Trazodone And Nefazodone Other (See Comments)    shivers  . Hctz [Hydrochlorothiazide] Rash    blisters    Antimicrobials this admission:   11/2 levofloxacin >>   Microbiology results:  BCx: pending    Sputum:  pending  Thank you for allowing pharmacy to be a part of this patient's care.  Pernell Dupre, PharmD, BCPS Clinical Pharmacist 01/22/2018 10:35 AM

## 2018-01-22 NOTE — Progress Notes (Deleted)
Pt unresponsive most of the night, responds to pain at times. Pt BP elevated through-out the night. MD aware, new parameters given. Will continue to monitor.

## 2018-01-22 NOTE — Progress Notes (Signed)
Chaplain responded to an OR for an AD. Wife was at the bedside. Chaplain educated family and Patient on the AD. Chaplain prayed for the family and the patient. Family will review and decide.    01/22/18 1200  Clinical Encounter Type  Visited With Patient and family together  Visit Type Initial  Referral From Nurse  Spiritual Encounters  Spiritual Needs Brochure;Prayer

## 2018-01-22 NOTE — Progress Notes (Signed)
[facility] NAME: Douglas Clark    MR#:  [id number]  DATE OF BIRTH:  [birth year]  SUBJECTIVE:  patient came in with increasing shortness of breath and cough. Found to have pneumonia. He feels better. Does not use oxygen at home. Family in the room. Patient wishes to go home.  REVIEW OF SYSTEMS:   Review of Systems  Constitutional: Negative for chills, fever and weight loss.  HENT: Negative for ear discharge, ear pain and nosebleeds.   Eyes: Negative for blurred vision, pain and discharge.  Respiratory: Positive for cough, sputum production and shortness of breath. Negative for wheezing and stridor.   Cardiovascular: Negative for chest pain, palpitations, orthopnea and PND.  Gastrointestinal: Negative for abdominal pain, diarrhea, nausea and vomiting.  Genitourinary: Negative for frequency and urgency.  Musculoskeletal: Positive for joint pain. Negative for back pain.  Neurological: Negative for sensory change, speech change, focal weakness and weakness.  Psychiatric/Behavioral: Negative for depression and hallucinations. The patient is not nervous/anxious.    Tolerating Diet:yes Tolerating PT: ambulates by himself  DRUG ALLERGIES:   Allergies  Allergen Reactions  . Donepezil   . Doxazosin Hives  . Trazodone And Nefazodone Other (See Comments)    shivers  . Hctz [Hydrochlorothiazide] Rash    blisters    VITALS:  Blood pressure (!) 114/59, pulse 75, temperature 98.8 F (37.1 C), temperature source Oral, resp. rate 16, weight 83.9 kg, SpO2 99 %.  PHYSICAL EXAMINATION:   Physical Exam  GENERAL:  77 y.o.-year-old patient lying in the bed with no acute distress.  EYES: Pupils equal, round, reactive to light and accommodation. No scleral icterus. Extraocular muscles intact.  HEENT: Head atraumatic, normocephalic. Oropharynx and nasopharynx clear.  NECK:  Supple, no jugular venous distention. No thyroid enlargement,  no tenderness.  LUNGS: Normal breath sounds bilaterally, no wheezing, rales, rhonchi. No use of accessory muscles of respiration.  CARDIOVASCULAR: S1, S2 normal. No murmurs, rubs, or gallops.  ABDOMEN: Soft, nontender, nondistended. Bowel sounds present. No organomegaly or mass.  EXTREMITIES: No cyanosis, clubbing or edema b/l.   Right upper extremity dressing present secondary to abrasion from recent fall NEUROLOGIC: Cranial nerves II through XII are intact. No focal Motor or sensory deficits b/l.   PSYCHIATRIC:  patient is alert and oriented x 3.  SKIN: No obvious rash, lesion, or ulcer.   LABORATORY PANEL:  CBC Recent Labs  Lab 01/22/18 0421  WBC 6.7  HGB 12.8*  HCT 38.2*  PLT 111*    Chemistries  Recent Labs  Lab 01/20/18 1718  01/22/18 0421  NA 138   < > 137  K 4.2   < > 4.2  CL 100   < > 102  CO2 30   < > 27  GLUCOSE 134*   < > 135*  BUN 14   < > 27*  CREATININE 1.35*   < > 1.19  CALCIUM 10.3   < > 8.8*  AST 25  --   --   ALT 6  --   --   ALKPHOS 73  --   --   BILITOT 1.3*  --   --    < > = values in this interval not displayed.   Cardiac Enzymes No results for input(s): TROPONINI in the last 168 hours. RADIOLOGY:  Dg Chest 1 View  Result Date: 01/21/2018 CLINICAL DATA:  Shortness of breath. EXAM: CHEST  1 VIEW COMPARISON:  None. FINDINGS: Mild opacity in left  base may represent atelectasis versus mild infiltrate. No pneumothorax. The heart, hila, mediastinum, lungs, and pleura are otherwise unremarkable. No fractures noted. IMPRESSION: Mild opacity in left base could represent atelectasis or early infiltrate. Recommend clinical correlation and follow-up to resolution. Electronically Signed   By: Dorise Bullion III M.D   On: 01/21/2018 18:09   X-ray Chest Pa And Lateral  Result Date: 01/22/2018 CLINICAL DATA:  Altered mental status.  Hypoxia. EXAM: CHEST - 2 VIEW COMPARISON:  One-view chest x-ray 01/21/2018. FINDINGS: Heart size is normal. Lung volumes are low.  Progressive left lower lobe airspace disease is concerning for pneumonia. A left pleural effusion is suspected. Minimal atelectasis is again seen on the right. Aortic atherosclerosis is present. The patient is status post median sternotomy. IMPRESSION: 1. Progressive left lower lobe airspace disease consistent with pneumonia. 2. Probable small left pleural effusion. 3. Low lung volumes with mild right atelectasis. Electronically Signed   By: San Morelle M.D.   On: 01/22/2018 09:47   Ct Head Wo Contrast  Result Date: 01/22/2018 CLINICAL DATA:  Ataxia, stroke suspected.  Multiple recent falls. EXAM: CT HEAD WITHOUT CONTRAST TECHNIQUE: Contiguous axial images were obtained from the base of the skull through the vertex without intravenous contrast. COMPARISON:  MRI brain 06/23/2015 FINDINGS: Brain: Advanced atrophy and diffuse white matter disease is again seen. There is some progression since the prior exam. Ventriculomegaly is proportionate to the degree of atrophy and not significantly changed. No significant extra-axial fluid collection is present. Basal ganglia are intact. No acute or focal cortical abnormalities are present. Cerebellum is within normal limits. Vascular: Atherosclerotic calcifications are present within the cavernous internal carotid arteries bilaterally. There is no hyperdense vessel associated. Skull: The calvarium is intact. No focal lytic or blastic lesions are present. Sinuses/Orbits: Mucosal thickening is present in the inferior left frontal sinus. Previous ethmoid and frontal sinus surgery is present. Mild residual mucosal thickening is present in the ethmoid air cells. The mastoid air cells are clear bilaterally. IMPRESSION: 1. Advanced atrophy and diffuse white matter disease. This likely reflects the sequela of chronic microvascular ischemia. Ventriculomegaly is somewhat worse. Normal pressure hydrocephalus is not entirely excluded. 2. Atherosclerosis. 3. Status post sinus  surgery with some residual mucosal disease. Electronically Signed   By: San Morelle M.D.   On: 01/22/2018 08:06   ASSESSMENT AND PLAN:  Douglas Clark  is a 77 y.o. male with a known history of COPD, tobacco abuse, mild dementia, Parkinson's disease, hypertension who was recently at [facility] 2 days back for right upper extremity trauma after his camper ran over the arm presents to the emergency room due to lethargy and slurred speech.   Here he was found to be hypoxic at 87% on room air.  Chest x-ray shows left lower lobe atelectasis versus pneumonia.  * Left lower lobe pneumonia with acute hypoxic respiratory failure  - IV Levaquin.  Pharmacy to dose.  Sputum cultures ordered.   BC negative Unclear if this is atelectasis or pneumonia on chest x-ray.   -procalcitonin level is negative however repeat cxr shows Pneumonia Wean oxygen as tolerated -given history of tobacco abuse long-standing patient likely has COPD. -PRN breathing treatments. Incentive spirometer.  *Opioid sensitivity.  Patient received 2 hydrocodone tablets given to him from recent trauma ER visit.  Needed Narcan in the emergency room.  Now improved.  Will keep Narcan as needed.  Could be contributing to his hypoxia.  *Parkinson's disease.  Continue home medications.  Physical therapy evaluation.  *  Hypertension.  Hold medications.  *Tobacco abuse.  Counseled to quit smoking for greater than 3 minutes.  DVT prophylaxis with Lovenox   discussed with patient wife and daughter   case discussed with Care Management/Social Worker. Management plans discussed with the patient, family and they are in agreement.  CODE STATUS: *full DVT Prophylaxis: lovenox  TOTAL TIME TAKING CARE OF THIS PATIENT: *40* minutes.  >50% time spent on counselling and coordination of care  POSSIBLE D/C IN *1-2* DAYS, DEPENDING ON CLINICAL CONDITION.  Note: This dictation was prepared with Dragon dictation along with smaller  phrase technology. Any transcriptional errors that result from this process are unintentional.  Fritzi Mandes M.D on 01/22/2018 at 2:11 PM  Between 7am to 6pm - Pager - [phone]  After 6pm go to [email or web address] - password [email or web address] [facility]  Office  [phone]  CC: Primary care physician; Idelle Crouch, MDPatient ID: Douglas Clark, male   DOB: [birth year], 77 y.o.   MRN: [id number]

## 2018-01-23 LAB — PROCALCITONIN

## 2018-01-23 MED ORDER — IPRATROPIUM-ALBUTEROL 0.5-2.5 (3) MG/3ML IN SOLN: 3.0000 mL | RESPIRATORY_TRACT | Status: DC | PRN

## 2018-01-23 MED ORDER — LEVOFLOXACIN 500 MG PO TABS: 500.0000 mg | ORAL_TABLET | ORAL | Status: DC

## 2018-01-23 MED ORDER — IBUPROFEN 400 MG PO TABS: 400.0000 mg | ORAL_TABLET | Freq: Four times a day (QID) | ORAL | Status: DC | PRN

## 2018-01-23 MED ORDER — HYDROCODONE-ACETAMINOPHEN 10-325 MG PO TABS
1.0000 | ORAL_TABLET | Freq: Four times a day (QID) | ORAL | Status: DC | PRN
  Administered 2018-01-23: 12:00:00 1 via ORAL
  Filled 2018-01-23: qty 1

## 2018-01-23 MED ORDER — LEVOFLOXACIN 500 MG PO TABS: 500.0000 mg | ORAL_TABLET | ORAL | 0 refills | Status: DC

## 2018-01-23 MED ORDER — IPRATROPIUM-ALBUTEROL 0.5-2.5 (3) MG/3ML IN SOLN
3.0000 mL | Freq: Three times a day (TID) | RESPIRATORY_TRACT | Status: DC
  Administered 2018-01-23: 08:00:00 3 mL via RESPIRATORY_TRACT
  Filled 2018-01-23: qty 3

## 2018-01-23 NOTE — Discharge Summary (Signed)
[facility] NAME: Douglas Clark    MR#:  [id number]  DATE OF BIRTH:  1940/12/02  DATE OF ADMISSION:  01/21/2018 ADMITTING PHYSICIAN: Hillary Bow, MD  DATE OF DISCHARGE: 01/23/2018  PRIMARY CARE PHYSICIAN: Idelle Crouch, MD    ADMISSION DIAGNOSIS:  Hypoxia [R09.02]  DISCHARGE DIAGNOSIS:  acute hypoxic respiratory failure secondary to left lower lobe mild pneumonia/atelectasis hypoxia resolved right arm contusion/trauma --- to follow at the wound care center according to instructions given by [facility]  SECONDARY DIAGNOSIS:   Past Medical History:  Diagnosis Date  . Anxiety   . Arthritis   . BBB (bundle branch block)    RIGHT AND LEFT  . Cancer [facility])    Prostate Cancer  . Chronic fatigue and malaise    with STM loss  . Chronic kidney disease    stone  . Concussion 1993   with long term memory loss  . Coronary artery disease   . Dementia ([place])   . Diverticulosis   . Emphysema lung ([place])   . GERD (gastroesophageal reflux disease)   . HH (hiatus hernia)   . History of kidney stones   . Hyperlipidemia   . Hypertension   . Myocardial infarction ([place])   . Parkinson disease ([place])   . Weight loss     HOSPITAL COURSE:   Douglas Clark a77 y.o.malewith a known history of COPD, tobacco abuse, mild dementia, Parkinson's disease, hypertension who was recently at [facility] 2 days back for right upper extremity trauma after his camper ran over the arm presents to the emergency room due to lethargy and slurred speech. Here he was found to be hypoxic at 87% on room air. Chest x-ray shows left lower lobe atelectasis versus pneumonia.  *Left lower lobe pneumoniawith acute hypoxic respiratory failure  - IV Levaquin. Pharmacy to dose.-- Change to oral Levaquin -BC negative Unclear if this is atelectasis or pneumonia on chest x-ray.  -it's 95% on room air -procalcitonin level is negative  however repeat cxr shows Pneumonia -given history of tobacco abuse long-standing patient likely has COPD. -PRN breathing treatments. Incentive spirometer. -She feels a lot better. Hemodynamically stable  *Right upper extremity wound secondary to trauma couple days ago at home -wound care nurse to see patient -dressing change done. -Patient wife tells me he is supposed to follow up at the wound care center  *Parkinson's disease. Continue home medications. Physical therapy evaluation.  *Hypertension. Resume medications.  *Tobacco abuse. Counseled to quit smoking for greater than 3 minutes.  DVT prophylaxis with Lovenox  Discharge patient to home. Discussed with wife if she thinks patient will need home health. Wife tells me she is going to talk with Dr. Doy Hutching and arrange it as outpatient if she feels it necessary  CONSULTS OBTAINED:    DRUG ALLERGIES:   Allergies  Allergen Reactions  . Donepezil   . Doxazosin Hives  . Trazodone And Nefazodone Other (See Comments)    shivers  . Hctz [Hydrochlorothiazide] Rash    blisters    DISCHARGE MEDICATIONS:   Allergies as of 01/23/2018      Reactions   Donepezil    Doxazosin Hives   Trazodone And Nefazodone Other (See Comments)   shivers   Hctz [hydrochlorothiazide] Rash   blisters      Medication List    STOP taking these medications   cephALEXin 500 MG capsule Commonly known as:  KEFLEX   oxyCODONE-acetaminophen 5-325 MG  tablet Commonly known as:  PERCOCET/ROXICET   sucralfate 1 g tablet Commonly known as:  CARAFATE     TAKE these medications   amitriptyline 25 MG tablet Commonly known as:  ELAVIL Take 1 tablet by mouth at bedtime.   aspirin 81 MG chewable tablet Chew 81 mg by mouth daily.   BENEFIBER DRINK MIX PO Take 5 mLs by mouth daily.   carbidopa-levodopa 25-100 MG tablet Commonly known as:  SINEMET IR Take 1 tablet by mouth 3 (three) times daily.   carvedilol 12.5 MG tablet Commonly  known as:  COREG Take 1 tablet (12.5 mg total) by mouth 2 (two) times daily with a meal.   cholecalciferol 1000 units tablet Commonly known as:  VITAMIN D Take 2,000 Units by mouth daily.   clonazePAM 2 MG tablet Commonly known as:  KLONOPIN Take 2 mg by mouth at bedtime.   Cranberry 400 MG Tabs Take 800 mg by mouth daily.   esomeprazole 20 MG capsule Commonly known as:  NEXIUM Take 20 mg by mouth daily.   HYDROcodone-acetaminophen 10-325 MG tablet Commonly known as:  NORCO Take 1 tablet by mouth every 6 (six) hours as needed for moderate pain. for pain   levofloxacin 500 MG tablet Commonly known as:  LEVAQUIN Take 1 tablet (500 mg total) by mouth daily.   losartan 50 MG tablet Commonly known as:  COZAAR Take 50 mg by mouth daily.   mirtazapine 30 MG tablet Commonly known as:  REMERON Take 30 mg by mouth at bedtime.   MULTI-VITAMINS Tabs Take 1 tablet by mouth daily.   nitroGLYCERIN 0.4 MG SL tablet Commonly known as:  NITROSTAT Place 1 tablet (0.4 mg total) under the tongue every 5 (five) minutes as needed for chest pain.   ondansetron 4 MG disintegrating tablet Commonly known as:  ZOFRAN-ODT Take 1 tablet (4 mg total) by mouth every 8 (eight) hours as needed for nausea or vomiting. What changed:  additional instructions   pantoprazole 40 MG tablet Commonly known as:  PROTONIX Take 40 mg by mouth 2 (two) times daily.   PREVAGEN 10 MG Caps Generic drug:  Apoaequorin Take 1 capsule by mouth daily.   simvastatin 20 MG tablet Commonly known as:  ZOCOR Take 20 mg by mouth daily.   vitamin B-12 1000 MCG tablet Commonly known as:  CYANOCOBALAMIN Take 2 tablets by mouth daily.       If you experience worsening of your admission symptoms, develop shortness of breath, life threatening emergency, suicidal or homicidal thoughts you must seek medical attention immediately by calling 911 or calling your MD immediately  if symptoms less severe.  You Must read  complete instructions/literature along with all the possible adverse reactions/side effects for all the Medicines you take and that have been prescribed to you. Take any new Medicines after you have completely understood and accept all the possible adverse reactions/side effects.   Please note  You were cared for by a hospitalist during your hospital stay. If you have any questions about your discharge medications or the care you received while you were in the hospital after you are discharged, you can call the unit and asked to speak with the hospitalist on call if the hospitalist that took care of you is not available. Once you are discharged, your primary care physician will handle any further medical issues. Please note that NO REFILLS for any discharge medications will be authorized once you are discharged, as it is imperative that you return to  your primary care physician (or establish a relationship with a primary care physician if you do not have one) for your aftercare needs so that they can reassess your need for medications and monitor your lab values. Today   SUBJECTIVE   Overall feels better. A bit anxious about dressing change on right upper extremity  VITAL SIGNS:  Blood pressure (!) 154/82, pulse 73, temperature (!) 97.4 F (36.3 C), temperature source Oral, resp. rate 16, weight 80.9 kg, SpO2 95 %.  I/O:    Intake/Output Summary (Last 24 hours) at 01/23/2018 1430 Last data filed at 01/23/2018 1017 Gross per 24 hour  Intake 332.22 ml  Output 800 ml  Net -467.78 ml    PHYSICAL EXAMINATION:  GENERAL:  77 y.o.-year-old patient lying in the bed with no acute distress.  EYES: Pupils equal, round, reactive to light and accommodation. No scleral icterus. Extraocular muscles intact.  HEENT: Head atraumatic, normocephalic. Oropharynx and nasopharynx clear.  NECK:  Supple, no jugular venous distention. No thyroid enlargement, no tenderness.  LUNGS: Normal breath sounds bilaterally,  no wheezing, rales,rhonchi or crepitation. No use of accessory muscles of respiration.  CARDIOVASCULAR: S1, S2 normal. No murmurs, rubs, or gallops.  ABDOMEN: Soft, non-tender, non-distended. Bowel sounds present. No organomegaly or mass.  EXTREMITIES: No pedal edema, cyanosis, or clubbing right upper extremity dressing present NEUROLOGIC: Cranial nerves II through XII are intact. Muscle strength 5/5 in all extremities. Sensation intact. Gait not checked.  PSYCHIATRIC: The patient is alert and oriented x 3.  SKIN: as above DATA REVIEW:   CBC  Recent Labs  Lab 01/22/18 0421  WBC 6.7  HGB 12.8*  HCT 38.2*  PLT 111*    Chemistries  Recent Labs  Lab 01/20/18 1718  01/22/18 0421  NA 138   < > 137  K 4.2   < > 4.2  CL 100   < > 102  CO2 30   < > 27  GLUCOSE 134*   < > 135*  BUN 14   < > 27*  CREATININE 1.35*   < > 1.19  CALCIUM 10.3   < > 8.8*  AST 25  --   --   ALT 6  --   --   ALKPHOS 73  --   --   BILITOT 1.3*  --   --    < > = values in this interval not displayed.    Microbiology Results   Recent Results (from the past 240 hour(s))  Blood culture (routine x 2)     Status: None (Preliminary result)   Collection Time: 01/21/18  5:42 PM  Result Value Ref Range Status   Specimen Description   Final    BLOOD Blood Culture results may not be optimal due to an excessive volume of blood received in culture bottles   Special Requests   Final    BOTTLES DRAWN AEROBIC AND ANAEROBIC BLOOD RIGHT HAND   Culture   Final    NO GROWTH 2 DAYS Performed at [facility], [address] [place], [place] [address]    Report Status PENDING  Incomplete  Blood culture (routine x 2)     Status: None (Preliminary result)   Collection Time: 01/21/18  5:42 PM  Result Value Ref Range Status   Specimen Description   Final    BLOOD Blood Culture results may not be optimal due to an excessive volume of blood received in culture bottles   Special Requests   Final  BOTTLES DRAWN  AEROBIC AND ANAEROBIC LEFT ANTECUBITAL   Culture   Final    NO GROWTH 2 DAYS Performed at [facility], [address] [place], [place] [address]    Report Status PENDING  Incomplete    RADIOLOGY:  Dg Chest 1 View  Result Date: 01/21/2018 CLINICAL DATA:  Shortness of breath. EXAM: CHEST  1 VIEW COMPARISON:  None. FINDINGS: Mild opacity in left base may represent atelectasis versus mild infiltrate. No pneumothorax. The heart, hila, mediastinum, lungs, and pleura are otherwise unremarkable. No fractures noted. IMPRESSION: Mild opacity in left base could represent atelectasis or early infiltrate. Recommend clinical correlation and follow-up to resolution. Electronically Signed   By: Dorise Bullion III M.D   On: 01/21/2018 18:09   X-ray Chest Pa And Lateral  Result Date: 01/22/2018 CLINICAL DATA:  Altered mental status.  Hypoxia. EXAM: CHEST - 2 VIEW COMPARISON:  One-view chest x-ray 01/21/2018. FINDINGS: Heart size is normal. Lung volumes are low. Progressive left lower lobe airspace disease is concerning for pneumonia. A left pleural effusion is suspected. Minimal atelectasis is again seen on the right. Aortic atherosclerosis is present. The patient is status post median sternotomy. IMPRESSION: 1. Progressive left lower lobe airspace disease consistent with pneumonia. 2. Probable small left pleural effusion. 3. Low lung volumes with mild right atelectasis. Electronically Signed   By: San Morelle M.D.   On: 01/22/2018 09:47   Ct Head Wo Contrast  Result Date: 01/22/2018 CLINICAL DATA:  Ataxia, stroke suspected.  Multiple recent falls. EXAM: CT HEAD WITHOUT CONTRAST TECHNIQUE: Contiguous axial images were obtained from the base of the skull through the vertex without intravenous contrast. COMPARISON:  MRI brain 06/23/2015 FINDINGS: Brain: Advanced atrophy and diffuse white matter disease is again seen. There is some progression since the prior exam. Ventriculomegaly is  proportionate to the degree of atrophy and not significantly changed. No significant extra-axial fluid collection is present. Basal ganglia are intact. No acute or focal cortical abnormalities are present. Cerebellum is within normal limits. Vascular: Atherosclerotic calcifications are present within the cavernous internal carotid arteries bilaterally. There is no hyperdense vessel associated. Skull: The calvarium is intact. No focal lytic or blastic lesions are present. Sinuses/Orbits: Mucosal thickening is present in the inferior left frontal sinus. Previous ethmoid and frontal sinus surgery is present. Mild residual mucosal thickening is present in the ethmoid air cells. The mastoid air cells are clear bilaterally. IMPRESSION: 1. Advanced atrophy and diffuse white matter disease. This likely reflects the sequela of chronic microvascular ischemia. Ventriculomegaly is somewhat worse. Normal pressure hydrocephalus is not entirely excluded. 2. Atherosclerosis. 3. Status post sinus surgery with some residual mucosal disease. Electronically Signed   By: San Morelle M.D.   On: 01/22/2018 08:06     Management plans discussed with the patient, family and they are in agreement.  CODE STATUS:     Code Status Orders  (From admission, onward)         Start     Ordered   01/21/18 1936  Full code  Continuous     01/21/18 1936        Code Status History    Date Active Date Inactive Code Status Order ID Comments User Context   02/04/2015 1715 02/10/2015 1902 Full Code [id number]  Leeroy Cha, MD Inpatient      TOTAL TIME TAKING CARE OF THIS PATIENT: *40* minutes.    Fritzi Mandes M.D on 01/23/2018 at 2:30 PM  Between 7am to 6pm - Pager -  [phone] After 6pm go to [email or web address] - password [email or web address] [facility] [facility]  Office  [phone]  CC: Primary care physician; Idelle Crouch, MD

## 2018-01-23 NOTE — Discharge Instructions (Signed)
Patient's wife instructed to follow-up wound clinic for right arm laceration.

## 2018-01-23 NOTE — Consult Note (Signed)
[facility] Nurse wound consult note Reason for Consult: traumatic wound to Right UE. Patient and wife are very anxious about pain during wound dressing change. Patient has been premedicated and is tearful. Procedure performed slowly and gently and I am assisted by the patient's Nursing Tech who provided distraction and support. Wound type:trauma Pressure Injury POA: NA Measurement: denuded area in anticubital space and proximal measuring 12cm x 6cm x 0.1cm. Scattered denuded partial thickness areas on the posterior aspect of the arm in a 12cm x 6cm area Wound bed: red, moist Drainage (amount, consistency, odor) small to moderate amount light yellow serous drainage from anterior aspect, no drainage from posterior aspect Periwound: intact, ecchymosis in the UE (proximal to injury) toward axillae Dressing procedure/placement/frequency: Cleanse with NS, or potable (tap) water, pat gently dry. Cover with Vaseline gauze, top with ABD pad, secure with Kerlix/paper tape.  Change daily.  Patient is to follow up with PCP.  [facility] nursing team will not follow, but will remain available to this patient, the nursing and medical teams.  Please re-consult if needed. Thanks, Maudie Flakes, MSN, RN, [place], Arther Abbott  Pager# [phone]

## 2018-01-26 LAB — CULTURE, BLOOD (ROUTINE X 2)
CULTURE: NO GROWTH
Culture: NO GROWTH

## 2018-02-04 LAB — BLOOD GAS, VENOUS
Acid-base deficit: 1.3 mmol/L (ref 0.0–2.0)
Bicarbonate: 25.2 mmol/L (ref 20.0–28.0)
O2 Saturation: 56.8 %
Patient temperature: 37
pCO2, Ven: 49 mmHg (ref 44.0–60.0)
pH, Ven: 7.32 (ref 7.250–7.430)

## 2018-03-09 ENCOUNTER — Ambulatory Visit: Payer: Medicare Other | Admitting: Radiation Oncology

## 2018-03-09 ENCOUNTER — Other Ambulatory Visit: Payer: Self-pay | Admitting: *Deleted

## 2018-03-09 DIAGNOSIS — C61 Malignant neoplasm of prostate: Secondary | ICD-10-CM

## 2018-03-22 DIAGNOSIS — G4733 Obstructive sleep apnea (adult) (pediatric): Secondary | ICD-10-CM | POA: Diagnosis not present

## 2018-03-31 DIAGNOSIS — G4733 Obstructive sleep apnea (adult) (pediatric): Secondary | ICD-10-CM | POA: Diagnosis not present

## 2018-04-04 ENCOUNTER — Other Ambulatory Visit: Payer: Self-pay | Admitting: Radiation Oncology

## 2018-04-08 DIAGNOSIS — G4733 Obstructive sleep apnea (adult) (pediatric): Secondary | ICD-10-CM | POA: Diagnosis not present

## 2018-04-13 ENCOUNTER — Inpatient Hospital Stay: Payer: PPO | Attending: Radiation Oncology

## 2018-04-13 ENCOUNTER — Other Ambulatory Visit: Payer: Self-pay | Admitting: *Deleted

## 2018-04-13 DIAGNOSIS — G8929 Other chronic pain: Secondary | ICD-10-CM | POA: Diagnosis not present

## 2018-04-13 DIAGNOSIS — Z0000 Encounter for general adult medical examination without abnormal findings: Secondary | ICD-10-CM | POA: Diagnosis not present

## 2018-04-13 DIAGNOSIS — I10 Essential (primary) hypertension: Secondary | ICD-10-CM | POA: Diagnosis not present

## 2018-04-13 DIAGNOSIS — G20 Parkinson's disease: Secondary | ICD-10-CM | POA: Diagnosis not present

## 2018-04-13 DIAGNOSIS — C61 Malignant neoplasm of prostate: Secondary | ICD-10-CM | POA: Diagnosis not present

## 2018-04-13 DIAGNOSIS — I2510 Atherosclerotic heart disease of native coronary artery without angina pectoris: Secondary | ICD-10-CM | POA: Diagnosis not present

## 2018-04-13 DIAGNOSIS — I25708 Atherosclerosis of coronary artery bypass graft(s), unspecified, with other forms of angina pectoris: Secondary | ICD-10-CM | POA: Diagnosis not present

## 2018-04-13 DIAGNOSIS — M545 Low back pain: Secondary | ICD-10-CM | POA: Diagnosis not present

## 2018-04-13 DIAGNOSIS — E782 Mixed hyperlipidemia: Secondary | ICD-10-CM | POA: Diagnosis not present

## 2018-04-13 LAB — PSA: Prostatic Specific Antigen: 0.03 ng/mL (ref 0.00–4.00)

## 2018-04-20 ENCOUNTER — Inpatient Hospital Stay: Payer: PPO

## 2018-04-20 ENCOUNTER — Ambulatory Visit: Payer: PPO | Admitting: Radiation Oncology

## 2018-05-01 DIAGNOSIS — G4752 REM sleep behavior disorder: Secondary | ICD-10-CM | POA: Diagnosis not present

## 2018-05-01 DIAGNOSIS — R413 Other amnesia: Secondary | ICD-10-CM | POA: Diagnosis not present

## 2018-05-01 DIAGNOSIS — Z8673 Personal history of transient ischemic attack (TIA), and cerebral infarction without residual deficits: Secondary | ICD-10-CM | POA: Diagnosis not present

## 2018-05-01 DIAGNOSIS — G20 Parkinson's disease: Secondary | ICD-10-CM | POA: Diagnosis not present

## 2018-05-03 ENCOUNTER — Other Ambulatory Visit: Payer: Self-pay | Admitting: Neurology

## 2018-05-03 DIAGNOSIS — Z8673 Personal history of transient ischemic attack (TIA), and cerebral infarction without residual deficits: Secondary | ICD-10-CM

## 2018-05-08 ENCOUNTER — Other Ambulatory Visit: Payer: Self-pay | Admitting: *Deleted

## 2018-05-09 ENCOUNTER — Other Ambulatory Visit: Payer: Self-pay | Admitting: Nurse Practitioner

## 2018-05-09 ENCOUNTER — Encounter: Payer: Self-pay | Admitting: Radiation Oncology

## 2018-05-09 DIAGNOSIS — G4733 Obstructive sleep apnea (adult) (pediatric): Secondary | ICD-10-CM | POA: Diagnosis not present

## 2018-05-09 DIAGNOSIS — C61 Malignant neoplasm of prostate: Secondary | ICD-10-CM

## 2018-05-09 NOTE — Progress Notes (Signed)
Treatment plan for lupron 30 mg IM once on 05/10/2018 entered on behalf of Dr. Baruch Gouty.

## 2018-05-10 ENCOUNTER — Inpatient Hospital Stay: Payer: PPO | Attending: Radiation Oncology

## 2018-05-10 ENCOUNTER — Other Ambulatory Visit: Payer: Self-pay

## 2018-05-10 ENCOUNTER — Encounter: Payer: Self-pay | Admitting: Radiation Oncology

## 2018-05-10 ENCOUNTER — Ambulatory Visit
Admission: RE | Admit: 2018-05-10 | Discharge: 2018-05-10 | Disposition: A | Discharge status: Home / Self Care | Payer: PPO | Source: Ambulatory Visit | Attending: Radiation Oncology | Admitting: Radiation Oncology

## 2018-05-10 DIAGNOSIS — C61 Malignant neoplasm of prostate: Secondary | ICD-10-CM | POA: Diagnosis not present

## 2018-05-10 DIAGNOSIS — Z923 Personal history of irradiation: Secondary | ICD-10-CM | POA: Insufficient documentation

## 2018-05-10 DIAGNOSIS — Z993 Dependence on wheelchair: Secondary | ICD-10-CM | POA: Diagnosis not present

## 2018-05-10 DIAGNOSIS — G20 Parkinson's disease: Secondary | ICD-10-CM | POA: Insufficient documentation

## 2018-05-10 DIAGNOSIS — Z79818 Long term (current) use of other agents affecting estrogen receptors and estrogen levels: Secondary | ICD-10-CM | POA: Insufficient documentation

## 2018-05-10 MED ORDER — LEUPROLIDE ACETATE (4 MONTH) 30 MG IM KIT
30.0000 mg | PACK | Freq: Once | INTRAMUSCULAR | Status: AC
  Administered 2018-05-10: 30 mg via INTRAMUSCULAR

## 2018-05-10 NOTE — Progress Notes (Signed)
Radiation Oncology Follow up Note  Name: Douglas Clark   Date:   05/10/2018 MRN:  [id number] DOB: 1940-07-29    This 78 y.o. male presents to the clinic today for 1 year follow-up status post radiation therapy for stage IIB adenocarcinoma prostate presenting the PSA of 5.Marland Kitchen  REFERRING PROVIDER: Idelle Crouch, MD  HPI: patient is a 78 year old male now out 1 year having completed IM RT radiation therapy to his prostate for Gleason 7 (3+4) adenocarcinoma prostate presenting the PSA of 5. He is seen today in routine follow-up is doing well. He specifically denies any increased lower urinary tract symptoms or diarrhea. Patient has been on Lupron will receive his last injection today.Marland Kitchenatient is dealing with Parkinson's disease is wheelchair-bound.  COMPLICATIONS OF TREATMENT: none  FOLLOW UP COMPLIANCE: keeps appointments   PHYSICAL EXAM:  BP (P) 125/79 (BP Location: Left Arm, Patient Position: Sitting)   Pulse (P) 61   Temp (!) (P) 95.8 F (35.4 C) (Tympanic)   Resp (P) 76  Wheelchair-bound male in NAD.Well-developed well-nourished patient in NAD. HEENT reveals PERLA, EOMI, discs not visualized.  Oral cavity is clear. No oral mucosal lesions are identified. Neck is clear without evidence of cervical or supraclavicular adenopathy. Lungs are clear to A&P. Cardiac examination is essentially unremarkable with regular rate and rhythm without murmur rub or thrill. Abdomen is benign with no organomegaly or masses noted. Motor sensory and DTR levels are equal and symmetric in the upper and lower extremities. Cranial nerves II through XII are grossly intact. Proprioception is intact. No peripheral adenopathy or edema is identified. No motor or sensory levels are noted. Crude visual fields are within normal range.  RADIOLOGY RESULTS: no current films for review  PLAN: present time patient is doing well under excellent biochemical control of his prostate cancer. His PSA has not changed in over 8  months is at 0.03. He will receive his last Lupron injection today. I will see him back in 1 year for follow-up a PSA at that time. Patient family know to call at anytime with any concerns.  I would like to take this opportunity to thank you for allowing me to participate in the care of your patient.Noreene Filbert, MD

## 2018-05-15 ENCOUNTER — Ambulatory Visit: Payer: PRIVATE HEALTH INSURANCE

## 2018-05-16 ENCOUNTER — Ambulatory Visit
Admission: RE | Admit: 2018-05-16 | Discharge: 2018-05-16 | Disposition: A | Discharge status: Home / Self Care | Payer: PPO | Source: Ambulatory Visit | Attending: Neurology | Admitting: Neurology

## 2018-05-16 DIAGNOSIS — Z8673 Personal history of transient ischemic attack (TIA), and cerebral infarction without residual deficits: Secondary | ICD-10-CM | POA: Insufficient documentation

## 2018-05-16 LAB — POCT I-STAT CREATININE: CREATININE: 1 mg/dL (ref 0.61–1.24)

## 2018-05-16 MED ORDER — GADOBUTROL 1 MMOL/ML IV SOLN
8.0000 mL | Freq: Once | INTRAVENOUS | Status: AC | PRN
  Administered 2018-05-16: 8 mL via INTRAVENOUS

## 2018-05-25 DIAGNOSIS — Z8673 Personal history of transient ischemic attack (TIA), and cerebral infarction without residual deficits: Secondary | ICD-10-CM | POA: Diagnosis not present

## 2018-05-25 DIAGNOSIS — G4752 REM sleep behavior disorder: Secondary | ICD-10-CM | POA: Diagnosis not present

## 2018-05-25 DIAGNOSIS — R2681 Unsteadiness on feet: Secondary | ICD-10-CM | POA: Diagnosis not present

## 2018-05-25 DIAGNOSIS — G20 Parkinson's disease: Secondary | ICD-10-CM | POA: Diagnosis not present

## 2018-06-06 DIAGNOSIS — G20 Parkinson's disease: Secondary | ICD-10-CM | POA: Diagnosis not present

## 2018-06-06 DIAGNOSIS — I10 Essential (primary) hypertension: Secondary | ICD-10-CM | POA: Diagnosis not present

## 2018-06-06 DIAGNOSIS — I452 Bifascicular block: Secondary | ICD-10-CM | POA: Diagnosis not present

## 2018-07-04 ENCOUNTER — Other Ambulatory Visit: Payer: Medicare Other

## 2018-07-05 ENCOUNTER — Encounter: Payer: Self-pay | Admitting: Urology

## 2018-07-11 ENCOUNTER — Ambulatory Visit: Payer: Medicare Other | Admitting: Urology

## 2018-07-11 ENCOUNTER — Other Ambulatory Visit: Payer: Self-pay

## 2018-07-11 ENCOUNTER — Telehealth (INDEPENDENT_AMBULATORY_CARE_PROVIDER_SITE_OTHER): Payer: Medicare Other | Admitting: Urology

## 2018-07-11 DIAGNOSIS — R350 Frequency of micturition: Secondary | ICD-10-CM

## 2018-07-11 DIAGNOSIS — C61 Malignant neoplasm of prostate: Secondary | ICD-10-CM

## 2018-07-11 DIAGNOSIS — R82998 Other abnormal findings in urine: Secondary | ICD-10-CM

## 2018-07-11 DIAGNOSIS — N200 Calculus of kidney: Secondary | ICD-10-CM

## 2018-07-12 NOTE — Progress Notes (Signed)
Virtual Visit via Telephone Note  I connected with Douglas Clark on 07/12/18 at 10:00 AM EDT by telephone and verified that I am speaking with the correct person using two identifiers.   I discussed the limitations, risks, security and privacy concerns of performing an evaluation and management service by telephone and the availability of in person appointments. I also discussed with the patient that there may be a patient responsible charge related to this service. The patient expressed understanding and agreed to proceed.   History of Present Illness: 78 year old male with personal history of intermediate risk prostate cancer and BPH with lower urinary symptoms who presents today via telephone encounter to discuss primarily his urinary symptoms.    In terms of cancer control, he status post XRT and had his last injection with Dr. Donella Stade in February.  He is not having any side effects or hot flashes from this.  Most recent PSA 03 on 04/13/2026.  Please see previous notes for details.  He does have personal history of Parkinson's disease and significant urinary symptoms.  He is currently on Flomax for urinary symptoms.  He reports that he has been having difficulty ambulating and getting to the bathroom on time.  He is now hanging urine all over the edge of his walker so that he does not have any accidents.  Use the urinal, he is afraid he might not get to the bathroom on time.  He does have daytime frequency.  He gets up anywhere from 0-3 times a night depending on the evening.  He is also concerned today that his urine is dark in appearance.  He does have a personal history of blood in the urine status post previous work-up including cystoscopy.  He denies any overt gross hematuria or clots.  Denies any flank pain or gross hematuria.   Observations/Objective: Speech somewhat slurred.  More alert throughout the conversation as it progressed.  Assessment and Plan:  1. Prostate cancer  ([place]) Status post XRT with Lupron No further plans for ADT, course completed Plan to check PSA in 3 months  2. Urinary frequency We discussed today consideration of anticholinergic or Myrbetriq to his regimen for urinary frequency symptoms Likely related to underlying Parkinson's disease Continue Flomax for the time being Will consider addition of medication at next follow-up  3. Kidney stone on left side Currently asymptomatic, will continue to follow  4. Dark urine Urged hydration We will check UA at next visit, consider repeat cystoscopy if there is evidence of microscopic blood  Symptoms reviewed  Follow Up Instructions: F/u 3 months for PSA/ IPSS/ PVR/ UA   I discussed the assessment and treatment plan with the patient. The patient was provided an opportunity to ask questions and all were answered. The patient agreed with the plan and demonstrated an understanding of the instructions.   The patient was advised to call back or seek an in-person evaluation if the symptoms worsen or if the condition fails to improve as anticipated.  I provided 18 minutes of non-face-to-face time during this encounter.   Hollice Espy, MD

## 2018-10-09 ENCOUNTER — Other Ambulatory Visit: Payer: Self-pay | Admitting: *Deleted

## 2018-10-09 DIAGNOSIS — C61 Malignant neoplasm of prostate: Secondary | ICD-10-CM

## 2018-10-10 ENCOUNTER — Ambulatory Visit (INDEPENDENT_AMBULATORY_CARE_PROVIDER_SITE_OTHER): Payer: Medicare Other | Admitting: Urology

## 2018-10-10 ENCOUNTER — Encounter: Payer: Self-pay | Admitting: Urology

## 2018-10-10 ENCOUNTER — Other Ambulatory Visit: Payer: Self-pay

## 2018-10-10 VITALS — BP 161/85 | HR 67 | Ht 70.0 in | Wt 167.0 lb

## 2018-10-10 DIAGNOSIS — C61 Malignant neoplasm of prostate: Secondary | ICD-10-CM | POA: Diagnosis not present

## 2018-10-10 DIAGNOSIS — N401 Enlarged prostate with lower urinary tract symptoms: Secondary | ICD-10-CM

## 2018-10-10 DIAGNOSIS — R82998 Other abnormal findings in urine: Secondary | ICD-10-CM | POA: Diagnosis not present

## 2018-10-10 DIAGNOSIS — N200 Calculus of kidney: Secondary | ICD-10-CM

## 2018-10-10 DIAGNOSIS — N138 Other obstructive and reflux uropathy: Secondary | ICD-10-CM

## 2018-10-10 LAB — BLADDER SCAN AMB NON-IMAGING: Scan Result: 81

## 2018-10-10 NOTE — Progress Notes (Signed)
10/10/2018 10:47 AM   Douglas Clark June 21, 1940 [id number]  Referring provider: Idelle Crouch, MD [facility] [place],  [place] [address]  Chief Complaint  Patient presents with  . Prostate Cancer    HPI: 78 year old male who presents today for follow-up for history of prostate cancer, urinary symptoms, and history of kidney stone.  He was seen virtually in April.  His PSA today is pending.  Prostate cancer Recently diagnosed with intermediate risk of high volume prostate cancer10/2018. PSA 5.09 slowly rising over the past 2 years. Rectal exam was abnormal bilaterally with a nodule at the apex. Additionally, and a 1.5 cm nodular focus of enhancement in the peripheral zone there is no evidence of lymphadenopathy at that point. Prostate biopsy revealed 12 of 12 cores positive for cancer, Gleason 3+4, high volumeup to 99% of the tissue. There was evidence of perineural invasion. TRUS volume 33 g.  He is now s/p IMRT in 03/2017 and ADT x 8 mo.  PSA today is pending.  Most recent PSA 0.03 on 04/13/2018.  BPH with urinary frequency and weak stream Continues to have daytime urinary frequency urgency as well as nocturia x3.  This is unchanged.  Since his been staying at home during [place], he is learned to manage this and has a urinal with him on his walker.  He is significantly less bother than previous.  He continues take Flomax. IPSS as below.  Underwent "heat shrinking" prostate procedure in 2005 which was ineffective.   Personal history of Parkinson's disease.  Kidney stone History of recurrent nephrolithiasis, multiple stone procedures including ESWL in the past.  Asymptomatic 12 mm left upper pole renal stone on most recent CT scan (6.2018).   No recent flank pain.  Having dark urine occasionally but also reports he is not drinking enough fluids.  No obvious gross hematuria.   IPSS    Row Name 10/10/18 1500         International Prostate Symptom Score   How often have you had the sensation of not emptying your bladder?  About half the time     How often have you had to urinate less than every two hours?  About half the time     How often have you found you stopped and started again several times when you urinated?  Almost always     How often have you found it difficult to postpone urination?  More than half the time     How often have you had a weak urinary stream?  Almost always     How often have you had to strain to start urination?  Less than half the time     How many times did you typically get up at night to urinate?  3 Times     Total IPSS Score  25       Quality of Life due to urinary symptoms   If you were to spend the rest of your life with your urinary condition just the way it is now how would you feel about that?  Mostly Satisfied        Score:  1-7 Mild 8-19 Moderate 20-35 Severe    PMH: Past Medical History:  Diagnosis Date  . Anxiety   . Arthritis   . BBB (bundle branch block)    RIGHT AND LEFT  . Cancer [facility])    Prostate Cancer  . Chronic fatigue and malaise    with STM loss  .  Chronic kidney disease    stone  . Concussion 1993   with long term memory loss  . Coronary artery disease   . Dementia ([place])   . Diverticulosis   . Emphysema lung ([place])   . GERD (gastroesophageal reflux disease)   . HH (hiatus hernia)   . History of kidney stones   . Hyperlipidemia   . Hypertension   . Myocardial infarction ([place])   . Parkinson disease ([place])   . Weight loss     Surgical History: Past Surgical History:  Procedure Laterality Date  . CARDIAC CATHETERIZATION    . COLONOSCOPY WITH PROPOFOL N/A 02/16/2016   Procedure: COLONOSCOPY WITH PROPOFOL;  Surgeon: Lollie Sails, MD;  Location: [facility] ENDOSCOPY;  Service: Endoscopy;  Laterality: N/A;  . CORONARY ARTERY BYPASS GRAFT     2000      . Denies    . ESOPHAGOGASTRODUODENOSCOPY (EGD) WITH PROPOFOL N/A 02/16/2016    Procedure: ESOPHAGOGASTRODUODENOSCOPY (EGD) WITH PROPOFOL;  Surgeon: Lollie Sails, MD;  Location: [facility] ENDOSCOPY;  Service: Endoscopy;  Laterality: N/A;  . ESOPHAGOGASTRODUODENOSCOPY (EGD) WITH PROPOFOL N/A 02/18/2017   Procedure: ESOPHAGOGASTRODUODENOSCOPY (EGD) WITH PROPOFOL;  Surgeon: Lollie Sails, MD;  Location: [facility] ENDOSCOPY;  Service: Endoscopy;  Laterality: N/A;  . KIDNEY SURGERY     REMOVED STONE FROM RT KIDNEY  . LITHOTRIPSY     X2   . LUMBAR LAMINECTOMY/DECOMPRESSION MICRODISCECTOMY Bilateral 02/04/2015   Procedure: LUMBAR LAMINECTOMY/DECOMPRESSION MICRODISCECTOMY  Bilateral - Lumbar two-three lumbar three-four lumbar four-five lumbar five sacral one ;  Surgeon: Leeroy Cha, MD;  Location: [place] NEURO ORS;  Service: Neurosurgery;  Laterality: Bilateral;  . PILONIDAL CYST / SINUS EXCISION      Home Medications:  Allergies as of 10/10/2018      Reactions   Donepezil    Doxazosin Hives   Trazodone And Nefazodone Other (See Comments)   shivers   Hctz [hydrochlorothiazide] Rash   blisters      Medication List       Accurate as of October 10, 2018 11:59 PM. If you have any questions, ask your nurse or doctor.        STOP taking these medications   levofloxacin 500 MG tablet Commonly known as: LEVAQUIN Stopped by: Hollice Espy, MD     TAKE these medications   amitriptyline 25 MG tablet Commonly known as: ELAVIL Take 1 tablet by mouth at bedtime.   aspirin 81 MG chewable tablet Chew 81 mg by mouth daily.   BENEFIBER DRINK MIX PO Take 5 mLs by mouth daily.   carbidopa-levodopa 25-100 MG tablet Commonly known as: SINEMET IR Take 1 tablet by mouth 3 (three) times daily.   carvedilol 12.5 MG tablet Commonly known as: COREG Take 1 tablet (12.5 mg total) by mouth 2 (two) times daily with a meal.   cholecalciferol 1000 units tablet Commonly known as: VITAMIN D Take 2,000 Units by mouth daily.   clonazePAM 2 MG tablet Commonly known as: KLONOPIN Take 2  mg by mouth at bedtime.   Cranberry 400 MG Tabs Take 800 mg by mouth daily.   esomeprazole 20 MG capsule Commonly known as: NEXIUM Take 20 mg by mouth daily.   HYDROcodone-acetaminophen 10-325 MG tablet Commonly known as: NORCO Take 1 tablet by mouth every 6 (six) hours as needed for moderate pain. for pain   losartan 50 MG tablet Commonly known as: COZAAR Take 50 mg by mouth daily.   mirtazapine 30 MG tablet Commonly known as: REMERON Take 30 mg  by mouth at bedtime.   Multi-Vitamins Tabs Take 1 tablet by mouth daily.   nitroGLYCERIN 0.4 MG SL tablet Commonly known as: NITROSTAT Place 1 tablet (0.4 mg total) under the tongue every 5 (five) minutes as needed for chest pain.   ondansetron 4 MG disintegrating tablet Commonly known as: Zofran ODT Take 1 tablet (4 mg total) by mouth every 8 (eight) hours as needed for nausea or vomiting. What changed: additional instructions   pantoprazole 40 MG tablet Commonly known as: PROTONIX Take 40 mg by mouth 2 (two) times daily.   Prevagen 10 MG Caps Generic drug: Apoaequorin Take 1 capsule by mouth daily.   simvastatin 20 MG tablet Commonly known as: ZOCOR Take 20 mg by mouth daily.   tamsulosin 0.4 MG Caps capsule Commonly known as: FLOMAX Take by mouth.   vitamin B-12 1000 MCG tablet Commonly known as: CYANOCOBALAMIN Take 2 tablets by mouth daily.       Allergies:  Allergies  Allergen Reactions  . Donepezil   . Doxazosin Hives  . Trazodone And Nefazodone Other (See Comments)    shivers  . Hctz [Hydrochlorothiazide] Rash    blisters    Family History: Family History  Problem Relation Age of Onset  . Varicose Veins Mother   . Heart disease Father     Social History:  reports that he has been smoking cigarettes. He has been smoking about 1.00 pack per day. He has never used smokeless tobacco. He reports previous alcohol use. He reports previous drug use.  ROS: UROLOGY Frequent Urination?: Yes Hard to  postpone urination?: Yes Burning/pain with urination?: No Get up at night to urinate?: Yes Leakage of urine?: Yes Urine stream starts and stops?: No Trouble starting stream?: Yes Do you have to strain to urinate?: No Blood in urine?: No Urinary tract infection?: No Sexually transmitted disease?: No Injury to kidneys or bladder?: No Painful intercourse?: No Weak stream?: No Erection problems?: Yes Penile pain?: No  Gastrointestinal Nausea?: No Vomiting?: No Indigestion/heartburn?: No Diarrhea?: No Constipation?: No  Constitutional Fever: No Night sweats?: No Weight loss?: No Fatigue?: No  Skin Skin rash/lesions?: No Itching?: No  Eyes Blurred vision?: No Double vision?: No  Ears/Nose/Throat Sore throat?: No Sinus problems?: No  Hematologic/Lymphatic Swollen glands?: No Easy bruising?: No  Cardiovascular Leg swelling?: No Chest pain?: No  Respiratory Cough?: No Shortness of breath?: No  Endocrine Excessive thirst?: No  Musculoskeletal Back pain?: No Joint pain?: No  Neurological Headaches?: No Dizziness?: No  Psychologic Depression?: No Anxiety?: No  Physical Exam: BP (!) 161/85   Pulse 67   Ht 5\' 10"  (1.778 m)   Wt 167 lb (75.8 kg)   BMI 23.96 kg/m   Constitutional:  Alert and oriented, No acute distress. HEENT: [place] AT, moist mucus membranes.  Trachea midline, no masses. Cardiovascular: No clubbing, cyanosis, or edema. Respiratory: Normal respiratory effort, no increased work of breathing. Skin: No rashes, bruises or suspicious lesions. Neurologic: Grossly intact, no focal deficits, moving all 4 extremities. Psychiatric: Normal mood and affect.  Laboratory Data: Lab Results  Component Value Date   WBC 6.7 01/22/2018   HGB 12.8 (L) 01/22/2018   HCT 38.2 (L) 01/22/2018   MCV 101.1 (H) 01/22/2018   PLT 111 (L) 01/22/2018    Lab Results  Component Value Date   CREATININE 1.00 05/16/2018    PSA today pending  Urinalysis  Results for orders placed or performed in visit on 10/10/18  Microscopic Examination   URINE  Result Value  Ref Range   WBC, UA 6-10 (A) 0 - 5 /hpf   RBC 11-30 (A) 0 - 2 /hpf   Epithelial Cells (non renal) 0-10 0 - 10 /hpf   Casts Present (A) None seen /lpf   Cast Type Hyaline casts N/A   Mucus, UA Present (A) Not Estab.   Bacteria, UA None seen None seen/Few  PSA  Result Value Ref Range   Prostate Specific Ag, Serum <0.1 0.0 - 4.0 ng/mL  Urinalysis, Complete  Result Value Ref Range   Specific Gravity, UA 1.025 1.005 - 1.030   pH, UA 5.5 5.0 - 7.5   Color, UA Yellow Yellow   Appearance Ur Hazy (A) Clear   Leukocytes,UA Trace (A) Negative   Protein,UA 1+ (A) Negative/Trace   Glucose, UA Negative Negative   Ketones, UA Negative Negative   RBC, UA 2+ (A) Negative   Bilirubin, UA Negative Negative   Urobilinogen, Ur 0.2 0.2 - 1.0 mg/dL   Nitrite, UA Negative Negative   Microscopic Examination See below:   Bladder Scan (Post Void Residual) in office  Result Value Ref Range   Scan Result 81      Pertinent Imaging: Results for orders placed or performed in visit on 10/10/18  Microscopic Examination   URINE  Result Value Ref Range   WBC, UA 6-10 (A) 0 - 5 /hpf   RBC 11-30 (A) 0 - 2 /hpf   Epithelial Cells (non renal) 0-10 0 - 10 /hpf   Casts Present (A) None seen /lpf   Cast Type Hyaline casts N/A   Mucus, UA Present (A) Not Estab.   Bacteria, UA None seen None seen/Few  PSA  Result Value Ref Range   Prostate Specific Ag, Serum <0.1 0.0 - 4.0 ng/mL  Urinalysis, Complete  Result Value Ref Range   Specific Gravity, UA 1.025 1.005 - 1.030   pH, UA 5.5 5.0 - 7.5   Color, UA Yellow Yellow   Appearance Ur Hazy (A) Clear   Leukocytes,UA Trace (A) Negative   Protein,UA 1+ (A) Negative/Trace   Glucose, UA Negative Negative   Ketones, UA Negative Negative   RBC, UA 2+ (A) Negative   Bilirubin, UA Negative Negative   Urobilinogen, Ur 0.2 0.2 - 1.0 mg/dL   Nitrite, UA  Negative Negative   Microscopic Examination See below:   Bladder Scan (Post Void Residual) in office  Result Value Ref Range   Scan Result 81     Assessment & Plan:    1. Prostate cancer ([place]) Status post XRT and Lupron No further Lupron at this time PSA today is pending We will continue to follow this on a every 40-month basis - Bladder Scan (Post Void Residual) in office - PSA - Urinalysis, Complete  2. Kidney stone on left side Currently asymptomatic despite fairly large stone Repeat KUB in 6 months to ensure that the stone is not enlarging Return sooner if he develops symptoms - Abdomen 1 view (KUB); Future  3. Dark urine There is a small amount of blood in his urine Encourage hydration He underwent cystoscopy the time of prostate biopsy recently, may be related to radiation changes and do not suspect underlying malignancy at this point We will repeat his UA at next visit and if he has persistent blood, will consider repeat cystoscopy at that time  4. BPH with obstruction/lower urinary tract symptoms Continue Flomax Adequate emptying today Symptoms not well controlled although he does not desire any further pharmacotherapy at this point in  time, has adapted his behavior including having a urinal near him as needed Symptoms also likely multifactorial given history of Parkinson's   Return in about 6 months (around 04/12/2019) for PSA, KUB, PVR.  Hollice Espy, MD  [facility] [address] [place], [place] [address] [phone]

## 2018-10-11 ENCOUNTER — Telehealth: Payer: Self-pay

## 2018-10-11 LAB — URINALYSIS, COMPLETE
Bilirubin, UA: NEGATIVE
Glucose, UA: NEGATIVE
Ketones, UA: NEGATIVE
Nitrite, UA: NEGATIVE
Specific Gravity, UA: 1.025 (ref 1.005–1.030)
Urobilinogen, Ur: 0.2 mg/dL (ref 0.2–1.0)
pH, UA: 5.5 (ref 5.0–7.5)

## 2018-10-11 LAB — MICROSCOPIC EXAMINATION: Bacteria, UA: NONE SEEN

## 2018-10-11 LAB — PSA: Prostate Specific Ag, Serum: <0.1 ng/mL (ref 0.0–4.0)

## 2018-10-11 NOTE — Telephone Encounter (Signed)
-----   Message from Hollice Espy, MD sent at 10/11/2018  8:29 AM EDT ----- PSA is undetectable.  Great news.  Hollice Espy, MD

## 2018-10-12 NOTE — Telephone Encounter (Signed)
mychart sent.

## 2019-04-12 ENCOUNTER — Other Ambulatory Visit: Payer: Medicare Other

## 2019-04-18 ENCOUNTER — Ambulatory Visit: Payer: Medicare Other | Admitting: Urology

## 2019-05-09 ENCOUNTER — Other Ambulatory Visit: Payer: Self-pay

## 2019-05-09 ENCOUNTER — Inpatient Hospital Stay: Payer: Medicare Other | Attending: Radiation Oncology

## 2019-05-09 DIAGNOSIS — C61 Malignant neoplasm of prostate: Secondary | ICD-10-CM

## 2019-05-09 LAB — PSA: Prostatic Specific Antigen: 0.01 ng/mL (ref 0.00–4.00)

## 2019-05-16 ENCOUNTER — Other Ambulatory Visit: Payer: Self-pay

## 2019-05-16 ENCOUNTER — Encounter: Payer: Self-pay | Admitting: Radiation Oncology

## 2019-05-16 ENCOUNTER — Ambulatory Visit
Admission: RE | Admit: 2019-05-16 | Discharge: 2019-05-16 | Disposition: A | Discharge status: Home / Self Care | Payer: Medicare Other | Source: Ambulatory Visit | Attending: Radiation Oncology | Admitting: Radiation Oncology

## 2019-05-16 VITALS — BP 162/90 | HR 60 | Temp 95.2°F | Resp 16 | Wt 161.0 lb

## 2019-05-16 DIAGNOSIS — R351 Nocturia: Secondary | ICD-10-CM | POA: Diagnosis not present

## 2019-05-16 DIAGNOSIS — Z87442 Personal history of urinary calculi: Secondary | ICD-10-CM | POA: Diagnosis not present

## 2019-05-16 DIAGNOSIS — Z923 Personal history of irradiation: Secondary | ICD-10-CM | POA: Diagnosis not present

## 2019-05-16 DIAGNOSIS — G20 Parkinson's disease: Secondary | ICD-10-CM | POA: Diagnosis not present

## 2019-05-16 DIAGNOSIS — C61 Malignant neoplasm of prostate: Secondary | ICD-10-CM | POA: Diagnosis not present

## 2019-05-16 NOTE — Progress Notes (Signed)
Radiation Oncology Follow up Note  Name: Douglas Clark   Date:   05/16/2019 MRN:  [id number] DOB: [birth year]    This 79 y.o. male presents to the clinic today for 2-year follow-up status post IMRT radiation therapy for stage IIb adenocarcinoma the prostate presenting with a PSA of 5.  REFERRING PROVIDER: Idelle Crouch, MD  HPI: Patient is a 79 year old male now out 2 years having completed IMRT radiation therapy to his prostate for Gleason 7 adenocarcinoma (3+4).  Seen today in routine follow-up he is doing well.  Specifically denies any increased lower urinary tract symptoms diarrhea.  He has nocturia x1.  His most recent PSA is less than 0.01.Marland Kitchen  Patient does have a history of renal stone also Parkinson's disease.  He is wheelchair-bound.  COMPLICATIONS OF TREATMENT: none  FOLLOW UP COMPLIANCE: keeps appointments   PHYSICAL EXAM:  BP (!) 162/90   Pulse 60   Temp (!) 95.2 F (35.1 C) (Tympanic)   Resp 16   Wt 161 lb (73 kg)   BMI 23.10 kg/m  Elderly appearing wheelchair-bound male in NAD.  Well-developed well-nourished patient in NAD. HEENT reveals PERLA, EOMI, discs not visualized.  Oral cavity is clear. No oral mucosal lesions are identified. Neck is clear without evidence of cervical or supraclavicular adenopathy. Lungs are clear to A&P. Cardiac examination is essentially unremarkable with regular rate and rhythm without murmur rub or thrill. Abdomen is benign with no organomegaly or masses noted. Motor sensory and DTR levels are equal and symmetric in the upper and lower extremities. Cranial nerves II through XII are grossly intact. Proprioception is intact. No peripheral adenopathy or edema is identified. No motor or sensory levels are noted. Crude visual fields are within normal range.  RADIOLOGY RESULTS: No current films for review  PLAN: Present time patient is under excellent biochemical control of his prostate cancer.  Based on his Parkinson's disease and difficulty with  transportation I am going to turn follow-up care over to urology and his PMD.  Patient is accompanied by his daughter today and that is fine with them.  I would be happy to reevaluate him at any time should further radiation oncology consultation be required.  I would like to take this opportunity to thank you for allowing me to participate in the care of your patient.Noreene Filbert, MD

## 2020-03-26 ENCOUNTER — Encounter: Payer: Self-pay | Admitting: Emergency Medicine

## 2020-03-26 ENCOUNTER — Other Ambulatory Visit: Payer: Self-pay

## 2020-03-26 DIAGNOSIS — R296 Repeated falls: Secondary | ICD-10-CM | POA: Diagnosis not present

## 2020-03-26 DIAGNOSIS — F17210 Nicotine dependence, cigarettes, uncomplicated: Secondary | ICD-10-CM | POA: Insufficient documentation

## 2020-03-26 DIAGNOSIS — S4991XA Unspecified injury of right shoulder and upper arm, initial encounter: Secondary | ICD-10-CM | POA: Diagnosis present

## 2020-03-26 DIAGNOSIS — I129 Hypertensive chronic kidney disease with stage 1 through stage 4 chronic kidney disease, or unspecified chronic kidney disease: Secondary | ICD-10-CM | POA: Diagnosis not present

## 2020-03-26 DIAGNOSIS — G20 Parkinson's disease: Secondary | ICD-10-CM | POA: Insufficient documentation

## 2020-03-26 DIAGNOSIS — Z7982 Long term (current) use of aspirin: Secondary | ICD-10-CM | POA: Insufficient documentation

## 2020-03-26 DIAGNOSIS — S42001A Fracture of unspecified part of right clavicle, initial encounter for closed fracture: Secondary | ICD-10-CM | POA: Insufficient documentation

## 2020-03-26 DIAGNOSIS — I714 Abdominal aortic aneurysm, without rupture: Secondary | ICD-10-CM | POA: Insufficient documentation

## 2020-03-26 DIAGNOSIS — Z79899 Other long term (current) drug therapy: Secondary | ICD-10-CM | POA: Insufficient documentation

## 2020-03-26 DIAGNOSIS — Z20822 Contact with and (suspected) exposure to covid-19: Secondary | ICD-10-CM | POA: Insufficient documentation

## 2020-03-26 DIAGNOSIS — F0281 Dementia in other diseases classified elsewhere with behavioral disturbance: Secondary | ICD-10-CM | POA: Diagnosis not present

## 2020-03-26 DIAGNOSIS — I25810 Atherosclerosis of coronary artery bypass graft(s) without angina pectoris: Secondary | ICD-10-CM | POA: Insufficient documentation

## 2020-03-26 DIAGNOSIS — N189 Chronic kidney disease, unspecified: Secondary | ICD-10-CM | POA: Diagnosis not present

## 2020-03-26 DIAGNOSIS — Z8546 Personal history of malignant neoplasm of prostate: Secondary | ICD-10-CM | POA: Diagnosis not present

## 2020-03-26 DIAGNOSIS — W19XXXA Unspecified fall, initial encounter: Secondary | ICD-10-CM | POA: Insufficient documentation

## 2020-03-26 LAB — BASIC METABOLIC PANEL
Anion gap: 11 (ref 5–15)
BUN: 15 mg/dL (ref 8–23)
CO2: 30 mmol/L (ref 22–32)
Calcium: 9.4 mg/dL (ref 8.9–10.3)
Chloride: 95 mmol/L — ABNORMAL LOW (ref 98–111)
Creatinine, Ser: 0.88 mg/dL (ref 0.61–1.24)
GFR, Estimated: >60 mL/min (ref >60)
Glucose, Bld: 120 mg/dL — ABNORMAL HIGH (ref 70–99)
Potassium: 4 mmol/L (ref 3.5–5.1)
Sodium: 136 mmol/L (ref 135–145)

## 2020-03-26 LAB — CBC WITH DIFFERENTIAL/PLATELET
Abs Immature Granulocytes: 0.04 10*3/uL (ref 0.00–0.07)
Basophils Absolute: 0.1 10*3/uL (ref 0.0–0.1)
Basophils Relative: 1 %
Eosinophils Absolute: 0 10*3/uL (ref 0.0–0.5)
Eosinophils Relative: 0 %
HCT: 46.6 % (ref 39.0–52.0)
Hemoglobin: 16.2 g/dL (ref 13.0–17.0)
Immature Granulocytes: 0 %
Lymphocytes Relative: 11 %
Lymphs Abs: 1 10*3/uL (ref 0.7–4.0)
MCH: 33.5 pg (ref 26.0–34.0)
MCHC: 34.8 g/dL (ref 30.0–36.0)
MCV: 96.3 fL (ref 80.0–100.0)
Monocytes Absolute: 0.5 10*3/uL (ref 0.1–1.0)
Monocytes Relative: 5 %
Neutro Abs: 7.7 10*3/uL (ref 1.7–7.7)
Neutrophils Relative %: 83 %
Platelets: 131 10*3/uL — ABNORMAL LOW (ref 150–400)
RBC: 4.84 MIL/uL (ref 4.22–5.81)
RDW: 12.4 % (ref 11.5–15.5)
WBC: 9.3 10*3/uL (ref 4.0–10.5)
nRBC: 0 % (ref 0.0–0.2)

## 2020-03-26 NOTE — ED Notes (Signed)
Pt unable to urinate at this time, given specimen container for when is able to do so. Warm blankets placed around pt. Pt wheeled back to the lobby via wc. Wife with pt.

## 2020-03-26 NOTE — ED Triage Notes (Signed)
Pt to ED via EMS with family member, per family member pt has parkinsons and has lost all control of his body. Pt's wife reports this morning found patient on the floor at approx 0830, per wife pt reports had been on the floor since 0400. Pt's wife reports that patient was referred to ED by PCP for possible placement and workup. Pt c/o back pain at this time.

## 2020-03-26 NOTE — ED Notes (Signed)
Rainbow sent to the lab.  

## 2020-03-26 NOTE — ED Notes (Signed)
First nurse note: pt comes ems from home for a fall. Hx of parkinsons that he is refusing tx for. Pain in right shoulder and lower back pain. MD wanted pt to come here and evaluated for placement.

## 2020-03-27 ENCOUNTER — Emergency Department: Payer: Medicare Other

## 2020-03-27 ENCOUNTER — Emergency Department
Admission: EM | Admit: 2020-03-27 | Discharge: 2020-03-29 | Disposition: A | Discharge status: Home / Self Care | Payer: Medicare Other | Attending: Emergency Medicine | Admitting: Emergency Medicine

## 2020-03-27 DIAGNOSIS — S42001A Fracture of unspecified part of right clavicle, initial encounter for closed fracture: Secondary | ICD-10-CM

## 2020-03-27 DIAGNOSIS — I714 Abdominal aortic aneurysm, without rupture, unspecified: Secondary | ICD-10-CM

## 2020-03-27 DIAGNOSIS — R296 Repeated falls: Secondary | ICD-10-CM

## 2020-03-27 LAB — URINALYSIS, COMPLETE (UACMP) WITH MICROSCOPIC
Bacteria, UA: NONE SEEN
Bilirubin Urine: NEGATIVE
Glucose, UA: NEGATIVE mg/dL
Ketones, ur: 20 mg/dL — AB
Nitrite: NEGATIVE
Protein, ur: 30 mg/dL — AB
Specific Gravity, Urine: 1.016 (ref 1.005–1.030)
Squamous Epithelial / HPF: NONE SEEN (ref 0–5)
pH: 6 (ref 5.0–8.0)

## 2020-03-27 LAB — SARS CORONAVIRUS 2 (TAT 6-24 HRS): SARS Coronavirus 2: NEGATIVE

## 2020-03-27 LAB — CBG MONITORING, ED: Glucose-Capillary: 100 mg/dL — ABNORMAL HIGH (ref 70–99)

## 2020-03-27 MED ORDER — APOAEQUORIN 10 MG PO CAPS: 1.0000 | ORAL_CAPSULE | Freq: Every day | ORAL | Status: DC

## 2020-03-27 MED ORDER — MIRTAZAPINE 15 MG PO TABS
30.0000 mg | ORAL_TABLET | Freq: Every day | ORAL | Status: DC
  Administered 2020-03-27 – 2020-03-28 (×2): 30 mg via ORAL
  Filled 2020-03-27 (×2): qty 2

## 2020-03-27 MED ORDER — ADULT MULTIVITAMIN W/MINERALS CH
1.0000 | ORAL_TABLET | Freq: Every day | ORAL | Status: DC
  Administered 2020-03-27 – 2020-03-29 (×3): 1 via ORAL
  Filled 2020-03-27 (×3): qty 1

## 2020-03-27 MED ORDER — VENLAFAXINE HCL ER 37.5 MG PO CP24
37.5000 mg | ORAL_CAPSULE | Freq: Every day | ORAL | Status: DC
  Administered 2020-03-27 – 2020-03-29 (×3): 37.5 mg via ORAL
  Filled 2020-03-27 (×4): qty 1

## 2020-03-27 MED ORDER — ASPIRIN 81 MG PO CHEW
81.0000 mg | CHEWABLE_TABLET | Freq: Every day | ORAL | Status: DC
  Administered 2020-03-27 – 2020-03-29 (×3): 81 mg via ORAL
  Filled 2020-03-27 (×4): qty 1

## 2020-03-27 MED ORDER — VITAMIN B-12 1000 MCG PO TABS
2000.0000 ug | ORAL_TABLET | Freq: Every day | ORAL | Status: DC
  Administered 2020-03-27 – 2020-03-29 (×3): 2000 ug via ORAL
  Filled 2020-03-27 (×3): qty 2

## 2020-03-27 MED ORDER — SUCRALFATE 1 G PO TABS
1.0000 g | ORAL_TABLET | Freq: Three times a day (TID) | ORAL | Status: DC
  Administered 2020-03-27 – 2020-03-29 (×8): 1 g via ORAL
  Filled 2020-03-27 (×7): qty 1

## 2020-03-27 MED ORDER — SIMVASTATIN 10 MG PO TABS
40.0000 mg | ORAL_TABLET | Freq: Every day | ORAL | Status: DC
  Administered 2020-03-27 – 2020-03-28 (×2): 40 mg via ORAL
  Filled 2020-03-27 (×2): qty 4

## 2020-03-27 MED ORDER — TAMSULOSIN HCL 0.4 MG PO CAPS
0.4000 mg | ORAL_CAPSULE | Freq: Every day | ORAL | Status: DC
  Administered 2020-03-27 – 2020-03-29 (×3): 0.4 mg via ORAL
  Filled 2020-03-27 (×3): qty 1

## 2020-03-27 MED ORDER — CLONAZEPAM 0.5 MG PO TABS
2.0000 mg | ORAL_TABLET | Freq: Every day | ORAL | Status: DC
  Administered 2020-03-27 – 2020-03-28 (×2): 2 mg via ORAL
  Filled 2020-03-27 (×2): qty 4

## 2020-03-27 MED ORDER — LOSARTAN POTASSIUM 50 MG PO TABS
50.0000 mg | ORAL_TABLET | Freq: Every day | ORAL | Status: DC
  Administered 2020-03-27 – 2020-03-29 (×3): 50 mg via ORAL
  Filled 2020-03-27 (×3): qty 1

## 2020-03-27 MED ORDER — AMITRIPTYLINE HCL 50 MG PO TABS
25.0000 mg | ORAL_TABLET | Freq: Every day | ORAL | Status: DC
  Administered 2020-03-27 – 2020-03-28 (×2): 25 mg via ORAL
  Filled 2020-03-27 (×2): qty 1

## 2020-03-27 MED ORDER — CARVEDILOL 6.25 MG PO TABS
12.5000 mg | ORAL_TABLET | Freq: Two times a day (BID) | ORAL | Status: DC
  Administered 2020-03-27 – 2020-03-29 (×5): 12.5 mg via ORAL
  Filled 2020-03-27 (×5): qty 2

## 2020-03-27 MED ORDER — CARBIDOPA-LEVODOPA 25-250 MG PO TABS
1.0000 | ORAL_TABLET | Freq: Three times a day (TID) | ORAL | Status: DC
  Administered 2020-03-27 – 2020-03-29 (×6): 1 via ORAL
  Filled 2020-03-27 (×11): qty 1

## 2020-03-27 MED ORDER — VITAMIN D 25 MCG (1000 UNIT) PO TABS
2000.0000 [IU] | ORAL_TABLET | Freq: Every day | ORAL | Status: DC
  Administered 2020-03-27 – 2020-03-29 (×3): 2000 [IU] via ORAL
  Filled 2020-03-27 (×3): qty 2

## 2020-03-27 MED ORDER — HYDROCODONE-ACETAMINOPHEN 10-325 MG PO TABS
1.0000 | ORAL_TABLET | Freq: Four times a day (QID) | ORAL | Status: DC | PRN
  Administered 2020-03-27: 1 via ORAL
  Filled 2020-03-27: qty 1

## 2020-03-27 MED ORDER — PANTOPRAZOLE SODIUM 40 MG PO TBEC
40.0000 mg | DELAYED_RELEASE_TABLET | Freq: Two times a day (BID) | ORAL | Status: DC
  Administered 2020-03-27 – 2020-03-29 (×5): 40 mg via ORAL
  Filled 2020-03-27 (×5): qty 1

## 2020-03-27 NOTE — ED Notes (Signed)
Missing 2200 meds requested from pharmacy.

## 2020-03-27 NOTE — ED Provider Notes (Signed)
Was asked to evaluate patient given nurse's concern that his speech was now slurred. She had him as an assignment earlier this morning and then was switched but upon being switched back felt his speech was slurred. On my exam he seemed to have some slurred speech however it was not consistent. He stated he felt like he has been having some difficulty with his speech since this morning. Did have a head CT done upon initial ER evaluation. Will plan on checking MRI to evaluate for CVA.   MRI without acute stroke. Shows findings for possible normal pressure hydrocephalus, however it appears to be stable from previous imaging. At this time will continue with plan set in place by Dr. Elesa Massed.   Phineas Semen, MD 03/27/20 2223

## 2020-03-27 NOTE — Evaluation (Signed)
Physical Therapy Evaluation Patient Details Name: Douglas Clark MRN: [id number] DOB: 08/15/1940 Today's Date: 03/27/2020   History of Present Illness  80 y.o. male with history of dementia, CAD, hypertension, hyperlipidemia, Parkinson's disease who presents to the emergency department with his wife for concerns for frequent falls over the past several weeks.  She states he last fell 2 days ago.  Has been having R shoulder pain, imaging reveals R clavical fx.  Apparently he is mostly in a wheelchair, wife unable to help him up when he does fall.  Clinical Impression  Pt was eager to work with PT, but ultimately was very limited, most notedly he was unable to keep his weight forward in sitting and especially standing needing essentially constant assist and/or cuing to keep from leaning/falling backward.  R UE in sling (PT adjusted to more appropriate positioning) and essentially maintained NWBing on R t/o the session.  He initially reports that he has been able to do regular in-home ambulation with walker, but eventually he reported that actually he is nearly w/c recently.  Pt is not safe to return home at this time and PT is recommending STR once cleared for discharge.     Follow Up Recommendations SNF;Supervision/Assistance - 24 hour    Equipment Recommendations  None recommended by PT    Recommendations for Other Services       Precautions / Restrictions Precautions Precautions: Fall Restrictions Weight Bearing Restrictions: No (no offical orders, but R UE in cling and maintained R UE NWBing t/o the PT exam)      Mobility  Bed Mobility Overal bed mobility: Needs Assistance Bed Mobility: Supine to Sit;Sidelying to Sit   Sidelying to sit: Min assist;Mod assist Supine to sit: Min assist;Mod assist     General bed mobility comments: Pt was able to initiate some LE movement toward EOB, but essentially got stuck and needed assist to square to bed and even more so to elevate/maintain  sitting (pt leaning back heavily)    Transfers Overall transfer level: Needs assistance Equipment used: 1 person hand held assist Transfers: Sit to/from Stand Sit to Stand: Mod assist         General transfer comment: Pt able to initiate rise to standing, but leaning back t/o the session and unable to shift weight forward, back of legs leaning heavily on bed and unable to shift forward despite much cuing and assist  Ambulation/Gait             General Gait Details: unable to shift weight forward enough to make an attempt at standing.  Stairs            Wheelchair Mobility    Modified Rankin (Stroke Patients Only)       Balance Overall balance assessment: Needs assistance Sitting-balance support: Single extremity supported Sitting balance-Leahy Scale: Poor Sitting balance - Comments: Pt needing at least some assist to keep from falling back nearly the entire time in sitting (despite assist to shift hips forward, cues for L UE use, etc)   Standing balance support: Single extremity supported Standing balance-Leahy Scale: Zero Standing balance comment: Pt leaning back heavily, unable to atain upright standing                             Pertinent Vitals/Pain Pain Assessment: Faces (reports only mild R shoulder pain) Faces Pain Scale: Hurts a little bit    Home Living Family/patient expects to be discharged to:: Skilled  nursing facility                      Prior Function Level of Independence: Needs assistance   Gait / Transfers Assistance Needed: apparently he could do some limited ambulation with walker but is mostly in the w/c for mobility  ADL's / Homemaking Assistance Needed: wife helps with most tasks, reports he uses pull-ups rather than go to the actual bathroom        Hand Dominance        Extremity/Trunk Assessment   Upper Extremity Assessment Upper Extremity Assessment: Generalized weakness (R shoulder not tested,  otherwise grossly 3+/5)    Lower Extremity Assessment Lower Extremity Assessment: Generalized weakness       Communication   Communication: No difficulties  Cognition Arousal/Alertness: Awake/alert Behavior During Therapy: WFL for tasks assessed/performed Overall Cognitive Status: History of cognitive impairments - at baseline                                 General Comments: Pt acknowledges that he has short-term memory issues, did have awareness of location and to a lesser degree his situation      General Comments      Exercises     Assessment/Plan    PT Assessment Patient needs continued PT services  PT Problem List Decreased strength;Decreased range of motion;Decreased activity tolerance;Decreased balance;Decreased mobility;Decreased coordination;Decreased cognition;Decreased knowledge of use of DME;Decreased safety awareness;Pain       PT Treatment Interventions Gait training;Stair training;Functional mobility training;Therapeutic exercise;Therapeutic activities;Balance training;DME instruction;Patient/family education    PT Goals (Current goals can be found in the Care Plan section)  Acute Rehab PT Goals Patient Stated Goal: Pt hopes to regain ability to walk more than a few feet PT Goal Formulation: With patient Time For Goal Achievement: 04/10/20 Potential to Achieve Goals: Fair    Frequency Min 2X/week   Barriers to discharge        Co-evaluation               AM-PAC PT "6 Clicks" Mobility  Outcome Measure Help needed turning from your back to your side while in a flat bed without using bedrails?: A Lot Help needed moving from lying on your back to sitting on the side of a flat bed without using bedrails?: A Lot Help needed moving to and from a bed to a chair (including a wheelchair)?: Total Help needed standing up from a chair using your arms (e.g., wheelchair or bedside chair)?: Total Help needed to walk in hospital room?:  Total Help needed climbing 3-5 steps with a railing? : Total 6 Click Score: 8    End of Session Equipment Utilized During Treatment: Gait belt Activity Tolerance: Patient tolerated treatment well;Patient limited by fatigue Patient left: in bed;with call bell/phone within reach Nurse Communication: Mobility status PT Visit Diagnosis: Muscle weakness (generalized) (M62.81);Difficulty in walking, not elsewhere classified (R26.2);Repeated falls (R29.6)    Time: 1884-1660 PT Time Calculation (min) (ACUTE ONLY): 25 min   Charges:   PT Evaluation $PT Eval Low Complexity: 1 Low PT Treatments $Therapeutic Activity: 8-22 mins        Malachi Pro, DPT 03/27/2020, 11:10 AM

## 2020-03-27 NOTE — ED Provider Notes (Signed)
[facility] Emergency Department Provider Note   ____________________________________________   Event Date/Time   First MD Initiated Contact with Patient 03/27/20 0320     (approximate)  I have reviewed the triage vital signs and the nursing notes.   HISTORY  Chief Complaint Fall    HPI Douglas Clark is a 80 y.o. male with history of dementia, CAD, hypertension, hyperlipidemia, Parkinson's disease who presents to the emergency department with his wife for concerns for frequent falls over the past several weeks.  She states he last fell 2 days ago.  She states she has to call EMS multiple times a week to help get him up but she is not able to lift him.  States that he is mostly in a wheelchair.  He is unable to transfer.  He is complaining of right shoulder pain after his last fall.  He is on aspirin but no anticoagulant.   She states that she is concerned because he has "knots" over his lower back and he has had previous lumbar surgery.  He denies back pain.  No fevers, cough, vomiting, diarrhea, chest pain or shortness of breath.   Wife Edd Fabian [phone]  Past Medical History:  Diagnosis Date  . Anxiety   . Arthritis   . BBB (bundle branch block)    RIGHT AND LEFT  . Cancer [facility])    Prostate Cancer  . Chronic fatigue and malaise    with STM loss  . Chronic kidney disease    stone  . Concussion 1993   with long term memory loss  . Coronary artery disease   . Dementia ([place])   . Diverticulosis   . Emphysema lung ([place])   . GERD (gastroesophageal reflux disease)   . HH (hiatus hernia)   . History of kidney stones   . Hyperlipidemia   . Hypertension   . Myocardial infarction ([place])   . Parkinson disease ([place])   . Weight loss     Patient Active Problem List   Diagnosis Date Noted  . Prostate cancer ([place]) 05/09/2018  . Pneumonia 01/21/2018  . Parkinsonism ([place]) 07/15/2015  . CTS (carpal tunnel syndrome) 07/10/2015  . Neck pain  06/24/2015  . Memory loss 06/11/2015  . Abnormality of gait 06/11/2015  . Lumbar stenosis with neurogenic claudication 02/04/2015  . Bifascicular block 12/13/2013  . CAD (coronary artery disease) of artery bypass graft 12/13/2013  . Essential hypertension 12/13/2013  . Anxiety and depression 12/13/2013  . Tobacco use 12/13/2013    Past Surgical History:  Procedure Laterality Date  . CARDIAC CATHETERIZATION    . COLONOSCOPY WITH PROPOFOL N/A 02/16/2016   Procedure: COLONOSCOPY WITH PROPOFOL;  Surgeon: Lollie Sails, MD;  Location: [facility] ENDOSCOPY;  Service: Endoscopy;  Laterality: N/A;  . CORONARY ARTERY BYPASS GRAFT     2000      . Denies    . ESOPHAGOGASTRODUODENOSCOPY (EGD) WITH PROPOFOL N/A 02/16/2016   Procedure: ESOPHAGOGASTRODUODENOSCOPY (EGD) WITH PROPOFOL;  Surgeon: Lollie Sails, MD;  Location: [facility] ENDOSCOPY;  Service: Endoscopy;  Laterality: N/A;  . ESOPHAGOGASTRODUODENOSCOPY (EGD) WITH PROPOFOL N/A 02/18/2017   Procedure: ESOPHAGOGASTRODUODENOSCOPY (EGD) WITH PROPOFOL;  Surgeon: Lollie Sails, MD;  Location: [facility] ENDOSCOPY;  Service: Endoscopy;  Laterality: N/A;  . KIDNEY SURGERY     REMOVED STONE FROM RT KIDNEY  . LITHOTRIPSY     X2   . LUMBAR LAMINECTOMY/DECOMPRESSION MICRODISCECTOMY Bilateral 02/04/2015   Procedure: LUMBAR LAMINECTOMY/DECOMPRESSION MICRODISCECTOMY  Bilateral - Lumbar two-three lumbar three-four  lumbar four-five lumbar five sacral one ;  Surgeon: Leeroy Cha, MD;  Location: [place] NEURO ORS;  Service: Neurosurgery;  Laterality: Bilateral;  . PILONIDAL CYST / SINUS EXCISION      Prior to Admission medications   Medication Sig Start Date End Date Taking? Authorizing Provider  amitriptyline (ELAVIL) 25 MG tablet Take 1 tablet by mouth at bedtime.  06/10/16 01/20/26  [provider]  Apoaequorin (PREVAGEN) 10 MG CAPS Take 1 capsule by mouth daily.    [provider]  aspirin 81 MG chewable tablet Chew 81 mg by mouth daily.     [provider]  carbidopa-levodopa (SINEMET IR) 25-100 MG tablet Take 1 tablet by mouth 3 (three) times daily. 07/15/15   Marcial Pacas, MD  carvedilol (COREG) 12.5 MG tablet Take 1 tablet (12.5 mg total) by mouth 2 (two) times daily with a meal. 12/17/13   Belva Crome, MD  cholecalciferol (VITAMIN D) 1000 UNITS tablet Take 2,000 Units by mouth daily.    [provider]  clonazePAM (KLONOPIN) 2 MG tablet Take 2 mg by mouth at bedtime. 11/20/14   [provider]  Cranberry 400 MG TABS Take 800 mg by mouth daily.    [provider]  esomeprazole (NEXIUM) 20 MG capsule Take 20 mg by mouth daily.    [provider]  HYDROcodone-acetaminophen (NORCO) 10-325 MG tablet Take 1 tablet by mouth every 6 (six) hours as needed for moderate pain. for pain 01/09/18   [provider]  losartan (COZAAR) 50 MG tablet Take 50 mg by mouth daily.  03/26/15 01/20/26  [provider]  mirtazapine (REMERON) 30 MG tablet Take 30 mg by mouth at bedtime.    [provider]  Multiple Vitamin (MULTI-VITAMINS) TABS Take 1 tablet by mouth daily.    [provider]  nitroGLYCERIN (NITROSTAT) 0.4 MG SL tablet Place 1 tablet (0.4 mg total) under the tongue every 5 (five) minutes as needed for chest pain. 12/13/13   Belva Crome, MD  ondansetron (ZOFRAN ODT) 4 MG disintegrating tablet Take 1 tablet (4 mg total) by mouth every 8 (eight) hours as needed for nausea or vomiting. Patient taking differently: Take 4 mg by mouth every 8 (eight) hours as needed for nausea or vomiting. Has not filled this medication yet 01/20/18   Charlann Lange, PA-C  pantoprazole (PROTONIX) 40 MG tablet Take 40 mg by mouth 2 (two) times daily. 07/11/17   [provider]  simvastatin (ZOCOR) 20 MG tablet Take 20 mg by mouth daily.    [provider]  vitamin B-12 (CYANOCOBALAMIN) 1000 MCG tablet Take 2 tablets by mouth daily.    [provider]  Wheat Dextrin  (BENEFIBER DRINK MIX PO) Take 5 mLs by mouth daily.    [provider]    Allergies Donepezil, Doxazosin, Trazodone and nefazodone, and Hctz [hydrochlorothiazide]  Family History  Problem Relation Age of Onset  . Varicose Veins Mother   . Heart disease Father     Social History Social History   Tobacco Use  . Smoking status: Current Every Day Smoker    Packs/day: 1.00    Types: Cigarettes  . Smokeless tobacco: Never Used  Vaping Use  . Vaping Use: Never used  Substance Use Topics  . Alcohol use: Not Currently    Comment: OCC  . Drug use: Not Currently    Review of Systems  Level 5 caveat secondary to dementia ____________________________________________   PHYSICAL EXAM:  VITAL SIGNS: ED Triage  Vitals  Enc Vitals Group     BP 03/26/20 1650 127/74     Pulse Rate 03/26/20 1650 90     Resp 03/26/20 1650 17     Temp 03/26/20 1650 97.8 F (36.6 C)     Temp Source 03/26/20 1650 Oral     SpO2 03/26/20 1650 100 %     Weight 03/26/20 1651 150 lb (68 kg)     Height 03/26/20 1651 5\' 9"  (1.753 m)     Head Circumference --      Peak Flow --      Pain Score --      Pain Loc --      Pain Edu? --      Excl. in GC? --     Constitutional: Alert and oriented person and place but not time.  Chronically ill-appearing, elderly, in no acute distress. Eyes: Conjunctivae are normal. PERRL. EOMI. Head: Atraumatic. Nose: No congestion/rhinnorhea. Mouth/Throat: Mucous membranes are moist.  Oropharynx non-erythematous. Neck: No stridor.  No cervical spine tenderness to palpation.  No step-off or deformity noted. Cardiovascular: Normal rate, regular rhythm. Grossly normal heart sounds.  Good peripheral circulation.  Chest wall is nontender to palpation. Respiratory: Normal respiratory effort.  No retractions. Lungs CTAB. Gastrointestinal: Soft and nontender. No distention. No abdominal bruits. No CVA tenderness. Musculoskeletal: Tender over the right anterior shoulder  without deformity.  Otherwise no extremity tenderness on exam.  Compartments soft.  No joint effusions noted.  Extremities warm and well-perfused. Neurologic:  Normal speech and language.  Patient is able to move all extremities equally.  No facial asymmetry.  He has a very hard time standing on his own and requires 2 people to transfer from wheelchair to bed. Skin:  Skin is warm, dry and intact. No rash noted. Psychiatric: Mood and affect are normal. Speech and behavior are normal.  ____________________________________________   LABS (all labs ordered are listed, but only abnormal results are displayed)  Labs Reviewed  CBC WITH DIFFERENTIAL/PLATELET - Abnormal; Notable for the following components:      Result Value   Platelets 131 (*)    All other components within normal limits  BASIC METABOLIC PANEL - Abnormal; Notable for the following components:   Chloride 95 (*)    Glucose, Bld 120 (*)    All other components within normal limits  URINALYSIS, COMPLETE (UACMP) WITH MICROSCOPIC   ____________________________________________  EKG   EKG Interpretation  Date/Time:  Wednesday March 26 2020 16:56:52 EST Ventricular Rate:  91 PR Interval:  128 QRS Duration: 140 QT Interval:  452 QTC Calculation: 555 R Axis:   -83 Text Interpretation: Normal sinus rhythm Right bundle branch block Left anterior fascicular block Septal infarct , age undetermined Abnormal ECG No significant change since last tracing Confirmed by 01-18-1985 [phone]) on 03/27/2020 5:32:54 AM       ____________________________________________  RADIOLOGY  ED MD interpretation: CT head shows no intracranial hemorrhage.  CT cervical spine shows no acute abnormality.  X-ray of the right shoulder shows distal right clavicle fracture.  Lumbar x-ray shows T12 compression fracture suspected to be remote in nature.  Official radiology report(s): DG Lumbar Spine Complete  Result Date: 03/27/2020 CLINICAL DATA:  Fall  EXAM: LUMBAR SPINE - COMPLETE 4+ VIEW COMPARISON:  CT abdomen pelvis 09/08/2016 FINDINGS: Normal lumbar lordosis. There is interval development of a moderate anterior wedge compression fracture at T12 with approximately 50% loss of height resulting in mild focal kyphosis at the thoracolumbar junction. Remaining vertebral body  heights are preserved. No listhesis. There is diffuse intervertebral disc space narrowing and endplate remodeling throughout the lumbar spine in keeping with changes of moderate to severe degenerative disc disease. The paraspinal soft tissues are unremarkable. In particular no significant paraspinal soft tissue swelling is seen adjacent to the T12 vertebral body suggesting that this represents a remote fracture. Vascular calcifications are seen within the abdominal aorta. A minimum 3.8 cm infrarenal abdominal aortic aneurysm is present. IMPRESSION: T12 compression fracture with 50% loss of height, likely remote in nature. Diffuse moderate to severe degenerative disc disease. Minimum 3.8 cm infrarenal abdominal aortic aneurysm. This appears enlarged when compared to prior CT examination of the abdomen and pelvis. Follow-up CT imaging may be warranted for further evaluation once the patient's acute issues have resolved. Electronically Signed   By: Fidela Salisbury MD   On: 03/27/2020 04:42   DG Shoulder Right  Result Date: 03/27/2020 CLINICAL DATA:  Fall EXAM: RIGHT SHOULDER - 2+ VIEW COMPARISON:  None. FINDINGS: Three view radiograph right shoulder demonstrates an acute, minimally displaced fracture of the distal right clavicle, distal to the coracoclavicular articulation. The acromioclavicular joint space is preserved. Fracture fragments are in grossly anatomic alignment. No other fracture identified. Normal glenohumeral alignment. Visualized right hemithorax is unremarkable. IMPRESSION: Mildly displaced, anatomically aligned fracture of the distal right clavicle. Electronically Signed   By:  Fidela Salisbury MD   On: 03/27/2020 04:38   CT Head Wo Contrast  Result Date: 03/27/2020 CLINICAL DATA:  Head trauma, Unwitnessed fall EXAM: CT HEAD WITHOUT CONTRAST TECHNIQUE: Contiguous axial images were obtained from the base of the skull through the vertex without intravenous contrast. COMPARISON:  MRI 05/16/2018 FINDINGS: Brain: Normal anatomic configuration. Parenchymal volume loss is commensurate with the patient's age. Moderate to severe subcortical and periventricular periventricular white matter changes are present likely reflecting the sequela of small vessel ischemia. Remote lacunar infarcts are noted within the left subinsular white matter anteriorly and right caudate nucleus no abnormal intra or extra-axial mass lesion or fluid collection. No abnormal mass effect or midline shift. No evidence of acute intracranial hemorrhage or infarct. Ventricular size is commensurate with the degree of cerebral atrophy. Cerebellum unremarkable. Vascular: No asymmetric hyperdense vasculature at the skull base. Skull: Intact Sinuses/Orbits: Left middle turbinectomy and bilateral maxillary antrostomy has been performed. The paranasal sinuses are clear. The orbits are unremarkable. Other: Mastoid air cells and middle ear cavities are clear. IMPRESSION: No evidence of acute intracranial hemorrhage or infarct. No calvarial fracture. Extensive periventricular white matter changes, likely representing chronic microvascular ischemic change. Remote lacunar infarcts within the left subinsular white matter and right caudate nucleus. Electronically Signed   By: Fidela Salisbury MD   On: 03/27/2020 05:02   CT Cervical Spine Wo Contrast  Result Date: 03/27/2020 CLINICAL DATA:  Neck trauma EXAM: CT CERVICAL SPINE WITHOUT CONTRAST TECHNIQUE: Multidetector CT imaging of the cervical spine was performed without intravenous contrast. Multiplanar CT image reconstructions were also generated. COMPARISON:  None. FINDINGS: Alignment: No  traumatic malalignment. Skull base and vertebrae: No acute fracture Soft tissues and spinal canal: No prevertebral fluid or swelling. No visible canal hematoma. Thickening and indistinctness of the right trapezius. There is a known right clavicle fracture. Disc levels: Ordinary disc degeneration most notable at C6-7 where there is uncovertebral ridging and biforaminal stenosis. Multilevel facet spurring. Upper chest: Biapical emphysema IMPRESSION: Negative for cervical spine fracture. Right trapezius strain in the setting of known right clavicle fracture. Electronically Signed   By: Monte Fantasia  M.D.   On: 03/27/2020 06:35    ____________________________________________   PROCEDURES  Procedure(s) performed: None  Procedures  Critical Care performed: No  ____________________________________________   INITIAL IMPRESSION / ASSESSMENT AND PLAN / ED COURSE  Patient here after progressive decline over the past several months.  Here with frequent falls.  Labs obtained in triage show no acute abnormality.  Will obtain CT of the head, cervical spine, x-rays of the right shoulder and lumbar spine.  Will obtain urinalysis, Covid testing.  Wife brought patient here for placement.  Will consult social work/case management and physical therapy.    Imaging shows right distal clavicle fracture.  Will place in sling.  CT head shows no acute abnormality.  Lumbar x-ray shows T12 compression fracture but this is suspected to be remote in nature.  He also has a AAA seen on x-ray but no chest pain, abdominal pain today.  This can be followed as an outpatient.  I doubt patient would be a good operative candidate given multiple medical issues and functional decline over the past few months.  Urine and Covid swab pending.  Signed out to oncoming EDP to follow-up on these results.  Social work, case Mudlogger, PT evaluation pending for placement.   I reviewed all nursing notes and pertinent previous records as  available.  I have reviewed and interpreted any EKGs, lab and urine results, imaging (as available).  ____________________________________________   FINAL CLINICAL IMPRESSION(S) / ED DIAGNOSES  Final diagnoses:  Frequent falls  Closed displaced fracture of right clavicle, unspecified part of clavicle, initial encounter  Abdominal aortic aneurysm (AAA) without rupture [facility])     ED Discharge Orders    None       Note:  This document was prepared using Dragon voice recognition software and may include unintentional dictation errors.    Theordore Cisnero, Delice Bison, DO 03/27/20 [phone]

## 2020-03-27 NOTE — ED Notes (Signed)
   03/27/20 1649  Assess: MEWS Score  BP 122/69  Pulse Rate (!) 114  Resp 20  SpO2 99 %  O2 Device Room Air   Pt called out on the call bell. Went into patient room. Pt stated that he cant talk. Pt speech is slurred more than normal . Dr. Derrill Kay called to bedside. Pt speech went back to his normal baseline from earlier while Dr. Derrill Kay at bedside.

## 2020-03-27 NOTE — ED Notes (Signed)
Pt in room resting his eyes. Pt said he needed to use the urinal. Assisted pt with the urinal. Changed pts pull-up.

## 2020-03-27 NOTE — ED Notes (Signed)
MD at bedside. 

## 2020-03-27 NOTE — ED Notes (Signed)
Patient denies pain and is resting comfortably. Wife at bedside

## 2020-03-27 NOTE — ED Notes (Signed)
Pt sleeping. Pt wife at bedside. NAD at this time.

## 2020-03-27 NOTE — ED Notes (Signed)
Pt provided with phone to contact wife. Pt given coffee and tv remote. Denies other needs at this time.

## 2020-03-27 NOTE — Progress Notes (Signed)
Medications given per MAR. This RN called wife as patient could not remember phone number. Breakfast tray given. NAD at this time.

## 2020-03-27 NOTE — Progress Notes (Signed)
PHARMACIST - PHYSICIAN ORDER COMMUNICATION  CONCERNING: P&T Medication Policy on Herbal Medications  DESCRIPTION:  This patient's order for:  Apoaequorin (Prevagen)  has been noted.  This product(s) is classified as an "herbal" or natural product. Due to a lack of definitive safety studies or FDA approval, nonstandard manufacturing practices, plus the potential risk of unknown drug-drug interactions while on inpatient medications, the Pharmacy and Therapeutics Committee does not permit the use of "herbal" or natural products of this type within [facility].   ACTION TAKEN: The pharmacy department is unable to verify this order at this time.  Please reevaluate patient's clinical condition at discharge and address if the herbal or natural product(s) should be resumed at that time.   Sharen Hones, PharmD, BCPS Clinical Pharmacist

## 2020-03-27 NOTE — ED Notes (Signed)
Pt resting. Pt wife at bedside. NAD at this time.

## 2020-03-28 MED ORDER — OLANZAPINE 5 MG PO TABS
5.0000 mg | ORAL_TABLET | Freq: Every day | ORAL | Status: DC
  Administered 2020-03-29: 5 mg via ORAL
  Filled 2020-03-28: qty 1

## 2020-03-28 NOTE — ED Notes (Signed)
Pt currently in the ER. Pt states coming in due to having multiple falls. Pt in bed, NAD noted.

## 2020-03-28 NOTE — ED Notes (Signed)
Wife at bedside.

## 2020-03-28 NOTE — ED Notes (Signed)
Pt requested I call his wife so he can speak with her. RN attempted to call cell phone and home phone of wife. No answer. Pt laying in bed.

## 2020-03-28 NOTE — ED Notes (Signed)
Pt ate 100% of meal tray 

## 2020-03-28 NOTE — ED Notes (Signed)
Pt resting peacefully in hallways; eyes closed. Side rails up X 2.

## 2020-03-28 NOTE — ED Notes (Signed)
Wife states she is leaving at this time, will return later this evening.

## 2020-03-28 NOTE — TOC Initial Note (Signed)
Transition of Care [facility]) - Initial/Assessment Note    Patient Details  Name: Douglas Clark MRN: [id number] Date of Birth: August 31, 1940  Transition of Care [facility]) CM/SW Contact:    Shelbie Ammons, RN Phone Number: 03/28/2020, 1:00 PM  Clinical Narrative:  RNCM reached out to patient's wife [name] by telephone for assessment and to discuss discharge planning. [name] reports that up until this point her husband has lived at home but has become increasingly harder to care for and requiring more assistance with frequent falls. Discussed that currently the recommendation is for skilled placement for rehab. Explained steps involved in this and wife verbalizes understanding and agreement. She reports that ultimately they would like to see him regain enough strength to be able to come home but they understand that they will need to see how rehab goes. Wife is agreeable to a search of all local facilities.  RNCM verified PASSR, completed FL2 and started bed search. TOC will follow and facilitate discharge when ready.             Expected Discharge Plan: Skilled Nursing Facility Barriers to Discharge: No Barriers Identified   Patient Goals and CMS Choice        Expected Discharge Plan and Services Expected Discharge Plan: [place] arrangements for the past 2 months: Single Family Home                                      Prior Living Arrangements/Services Living arrangements for the past 2 months: Single Family Home Lives with:: Spouse Patient language and need for interpreter reviewed:: Yes        Need for Family Participation in Patient Care: Yes (Comment) Care giver support system in place?: Yes (comment)   Criminal Activity/Legal Involvement Pertinent to Current Situation/Hospitalization: No - Comment as needed  Activities of Daily Living      Permission Sought/Granted                  Emotional Assessment              Admission  diagnosis:  fall Patient Active Problem List   Diagnosis Date Noted  . Prostate cancer ([place]) 05/09/2018  . Pneumonia 01/21/2018  . Parkinsonism ([place]) 07/15/2015  . CTS (carpal tunnel syndrome) 07/10/2015  . Neck pain 06/24/2015  . Memory loss 06/11/2015  . Abnormality of gait 06/11/2015  . Lumbar stenosis with neurogenic claudication 02/04/2015  . Bifascicular block 12/13/2013  . CAD (coronary artery disease) of artery bypass graft 12/13/2013  . Essential hypertension 12/13/2013  . Anxiety and depression 12/13/2013  . Tobacco use 12/13/2013   PCP:  Idelle Crouch, MD Pharmacy:   [id number] - [place], [place] [address] [place] [address] Phone: [phone] Fax: [phone]     Social Determinants of Health (SDOH) Interventions    Readmission Risk Interventions No flowsheet data found.

## 2020-03-28 NOTE — ED Notes (Signed)
Pt awake and using urinal with minimal assistance. No complaints or distress noted at this time.

## 2020-03-28 NOTE — ED Notes (Signed)
Given lunch; wife at bedside to assist. Requesting medical update, EDP aware.

## 2020-03-28 NOTE — ED Notes (Signed)
EDP at bedside to update 

## 2020-03-28 NOTE — ED Notes (Signed)
Wife at bedside, pt has food and drink

## 2020-03-28 NOTE — ED Notes (Signed)
Pt noted to have urinated in depends. This RN and ED tech cleaned pt, placed new depends on with chux under pt.

## 2020-03-28 NOTE — NC FL2 (Signed)
[place] LEVEL OF CARE SCREENING TOOL     IDENTIFICATION  Patient Name: Douglas Clark Birthdate: 1940/12/12 Sex: male Admission Date (Current Location): 03/27/2020  [place] and [place] Number:  Engineering geologist and Address:  [facility], [address], [place], [place] [address]      Provider Number: [id number]  Attending Physician Name and Address:  No att. providers found  Relative Name and Phone Number:  Ramal Eckhardt [phone]    Current Level of Care: Hospital Recommended Level of Care: [place] Prior Approval Number:    Date Approved/Denied:   PASRR Number: [id number]  Discharge Plan: SNF    Current Diagnoses: Patient Active Problem List   Diagnosis Date Noted  . Prostate cancer ([place]) 05/09/2018  . Pneumonia 01/21/2018  . Parkinsonism ([place]) 07/15/2015  . CTS (carpal tunnel syndrome) 07/10/2015  . Neck pain 06/24/2015  . Memory loss 06/11/2015  . Abnormality of gait 06/11/2015  . Lumbar stenosis with neurogenic claudication 02/04/2015  . Bifascicular block 12/13/2013  . CAD (coronary artery disease) of artery bypass graft 12/13/2013  . Essential hypertension 12/13/2013  . Anxiety and depression 12/13/2013  . Tobacco use 12/13/2013    Orientation RESPIRATION BLADDER Height & Weight     Self,Situation,Time,Place  Normal Incontinent Weight: 68 kg Height:  5\' 9"  (175.3 cm)  BEHAVIORAL SYMPTOMS/MOOD NEUROLOGICAL BOWEL NUTRITION STATUS      Continent Diet (Heart Healthy)  AMBULATORY STATUS COMMUNICATION OF NEEDS Skin   Extensive Assist Verbally Normal                       Personal Care Assistance Level of Assistance  Dressing,Feeding,Bathing Bathing Assistance: Maximum assistance Feeding assistance: Limited assistance Dressing Assistance: Maximum assistance     Functional Limitations Info  Sight,Hearing,Speech Sight Info: Adequate Hearing Info: Adequate Speech Info: Adequate     SPECIAL CARE FACTORS FREQUENCY  PT (By licensed PT),OT (By licensed OT)                    Contractures Contractures Info: Not present    Additional Factors Info  Code Status,Allergies Code Status Info: Full Allergies Info: Donepezil, Doxazosin, Trazodone, HCTZ           Current Medications (03/28/2020):  This is the current hospital active medication list Current Facility-Administered Medications  Medication Dose Route Frequency Provider Last Rate Last Admin  . amitriptyline (ELAVIL) tablet 25 mg  25 mg Oral QHS Ward, Kristen N, DO   25 mg at 03/27/20 2205  . aspirin chewable tablet 81 mg  81 mg Oral Daily Ward, Kristen N, DO   81 mg at 03/28/20 0914  . carbidopa-levodopa (SINEMET IR) 25-250 MG per tablet immediate release 1 tablet  1 tablet Oral TID Ward, Delice Bison, DO   1 tablet at 03/28/20 0909  . carvedilol (COREG) tablet 12.5 mg  12.5 mg Oral BID WC Ward, Kristen N, DO   12.5 mg at 03/28/20 0910  . cholecalciferol (VITAMIN D3) tablet 2,000 Units  2,000 Units Oral Daily Ward, Kristen N, DO   2,000 Units at 03/28/20 0911  . clonazePAM (KLONOPIN) tablet 2 mg  2 mg Oral QHS Ward, Kristen N, DO   2 mg at 03/27/20 2207  . HYDROcodone-acetaminophen (NORCO) 10-325 MG per tablet 1 tablet  1 tablet Oral Q6H PRN Ward, Delice Bison, DO   1 tablet at 03/27/20 2224  . losartan (COZAAR) tablet 50 mg  50 mg Oral Daily  Ward, Delice Bison, DO   50 mg at 03/28/20 0910  . mirtazapine (REMERON) tablet 30 mg  30 mg Oral QHS Ward, Kristen N, DO   30 mg at 03/27/20 2205  . multivitamin with minerals tablet 1 tablet  1 tablet Oral Daily Ward, Kristen N, DO   1 tablet at 03/28/20 0910  . pantoprazole (PROTONIX) EC tablet 40 mg  40 mg Oral BID Ward, Kristen N, DO   40 mg at 03/28/20 0908  . simvastatin (ZOCOR) tablet 40 mg  40 mg Oral q1800 Ward, Kristen N, DO   40 mg at 03/27/20 1703  . sucralfate (CARAFATE) tablet 1 g  1 g Oral TID AC Ward, Kristen N, DO   1 g at 03/28/20 1138  . tamsulosin (FLOMAX)  capsule 0.4 mg  0.4 mg Oral QPC breakfast Ward, Kristen N, DO   0.4 mg at 03/28/20 0911  . venlafaxine XR (EFFEXOR-XR) 24 hr capsule 37.5 mg  37.5 mg Oral Q breakfast Ward, Kristen N, DO   37.5 mg at 03/28/20 0908  . vitamin B-12 (CYANOCOBALAMIN) tablet 2,000 mcg  2,000 mcg Oral Daily Ward, Kristen N, DO   2,000 mcg at 03/28/20 0909   Current Outpatient Medications  Medication Sig Dispense Refill  . amitriptyline (ELAVIL) 25 MG tablet Take 25 mg by mouth at bedtime.    Marland Kitchen Apoaequorin 10 MG CAPS Take 1 capsule by mouth daily.    Marland Kitchen aspirin 81 MG chewable tablet Chew 81 mg by mouth daily.    . carvedilol (COREG) 12.5 MG tablet Take 1 tablet (12.5 mg total) by mouth 2 (two) times daily with a meal. 60 tablet 10  . cholecalciferol (VITAMIN D) 1000 UNITS tablet Take 2,000 Units by mouth daily.    . clonazePAM (KLONOPIN) 2 MG tablet Take 2 mg by mouth at bedtime.    . Cranberry 400 MG TABS Take 800 mg by mouth daily.    Marland Kitchen HYDROcodone-acetaminophen (NORCO) 10-325 MG tablet Take 1 tablet by mouth every 6 (six) hours as needed for moderate pain. for pain  0  . losartan (COZAAR) 50 MG tablet Take 50 mg by mouth daily.     . mirtazapine (REMERON) 30 MG tablet Take 30 mg by mouth at bedtime.    . Multiple Vitamin (MULTI-VITAMINS) TABS Take 1 tablet by mouth daily.    . nitroGLYCERIN (NITROSTAT) 0.4 MG SL tablet Place 1 tablet (0.4 mg total) under the tongue every 5 (five) minutes as needed for chest pain. 25 tablet 3  . pantoprazole (PROTONIX) 40 MG tablet Take 40 mg by mouth 2 (two) times daily.  3  . sucralfate (CARAFATE) 1 g tablet Take 1 g by mouth 3 (three) times daily after meals.    . tamsulosin (FLOMAX) 0.4 MG CAPS capsule Take 0.4 mg by mouth daily.    Marland Kitchen venlafaxine XR (EFFEXOR-XR) 37.5 MG 24 hr capsule Take 37.5 mg by mouth daily.    . vitamin B-12 (CYANOCOBALAMIN) 1000 MCG tablet Take 2,000 mcg by mouth daily.    . Wheat Dextrin (BENEFIBER DRINK MIX PO) Take 5 mLs by mouth daily.      Facility-Administered Medications Ordered in Other Encounters  Medication Dose Route Frequency Provider Last Rate Last Admin  . leuprolide (LUPRON) injection 30 mg  30 mg Intramuscular Once Noreene Filbert, MD         Discharge Medications: Please see discharge summary for a list of discharge medications.  Relevant Imaging Results:  Relevant Lab Results:   Additional  Information SSN [id number]  Shelbie Ammons, RN

## 2020-03-28 NOTE — ED Notes (Signed)
Pt eating breakfast at this time. Appears to be eating independently without difficulty. Will assist when needed. 8oz PO fluids intake.

## 2020-03-28 NOTE — ED Notes (Signed)
Pt clean and dry. repositioned in bed.

## 2020-03-28 NOTE — ED Notes (Signed)
Wife has left bedside. As per wife, pt does have some deteriation of the brain that does cause some altered mental status.

## 2020-03-29 NOTE — ED Notes (Signed)
Pt given a lunch sandwich tray from dining wife stated she would help patient eat

## 2020-03-29 NOTE — ED Notes (Signed)
Pt's wife refused to sign AMA , took patient out of ED , ED RN offered help to car , pts daughter refused help.

## 2020-03-29 NOTE — TOC Progression Note (Signed)
Transition of Care [facility]) - Progression Note    Patient Details  Name: Douglas Clark MRN: [id number] Date of Birth: Aug 12, 1940  Transition of Care [facility]) CM/SW [place], LCSW Phone Number: 03/29/2020, 8:47 AM  Clinical Narrative:   Patient will discharge to SNF. Waiting for bed offer.   CSW call patient's wife Douglas Clark, [phone], to update her of bed search.  [facility] made bed offer. She decline and would like "something closer, [place] or [place].   SNF bed search continue.    Expected Discharge Plan: [place] Barriers to Discharge: No Barriers Identified  Expected Discharge Plan and Services Expected Discharge Plan: [place] arrangements for the past 2 months: Single Family Home                   Social Determinants of Health (SDOH) Interventions   Readmission Risk Interventions No flowsheet data found.

## 2020-03-29 NOTE — ED Notes (Addendum)
RN was able to get a hold of wife. Wife stated concern, as this does not sound like her husband, and pt talked with wife on phone and pt states that is not his wife. Wife states she is coming in. ER provider notified. Pt is refusing medications at this time, provider notified as well.  Prior to this pt had both legs over the side rail. Charge RN helped this RN to get pt back into bed.

## 2020-03-29 NOTE — ED Notes (Signed)
Pt's bed and brief soiled, clean linens changed out

## 2020-03-29 NOTE — ED Notes (Signed)
Pt sleeping now. Family has left. Pt resting. Family very nice and appreciative.

## 2020-03-29 NOTE — ED Notes (Signed)
Pt refusing to put legs back in bed, they are currently over the side rail. Screener called and stated daughter is here and wife is on her way. Pt states he wont put legs back in the bed till family is here

## 2020-03-29 NOTE — ED Notes (Signed)
Patient was dry all 4 p's addressed

## 2020-03-29 NOTE — ED Notes (Signed)
Called wife to give an update and clarify how the pt best takes pills. She stated no applesauce, just a few at a time w/small sips

## 2020-03-29 NOTE — ED Notes (Signed)
Pt keeps throwing legs over the side rail. RN has put them back in the bed. Pt has thrown them back over the side rail.

## 2020-03-29 NOTE — ED Notes (Signed)
Daughter of patient entered lobby screaming wanting to take her father out of ED, this Probation officer asked daughter to wait for ED RN charge in a quieter room just adjacent from lobby  But daughter  stormed out of ED . ,This Probation officer spoke with wife of patient and wife stated she was going to take patient to [facility]. Patient unable to make medical decisions at thsi time.  ED MD made aware , wifer of patient un willing to sign AMA form. ED rn Charge made aware. Wife arrived to ED and ED RN (this Probation officer ) helped re cloth patient and help patient to their wheelchair. Patients diper was changed and was wet. Wife was upset and insisted to still take patient with her.

## 2020-03-29 NOTE — Progress Notes (Signed)
TOC CM expanded SNF referrals to [facility]. Waiting for bed offers. [place], [place] ED TOC CM [phone]

## 2020-03-31 NOTE — TOC Progression Note (Signed)
Transition of Care [facility]) - Progression Note    Patient Details  Name: Douglas Clark MRN: [id number] Date of Birth: [birth year]  Transition of Care [facility]) CM/SW Contact  Anselm Pancoast, RN Phone Number: 03/31/2020, 10:41 AM  Clinical Narrative:    Detailed discussion had with daughter yesterday 03/30/20  regarding needs and concerns for patient once he is home. Dtr again voiced her concerns regarding resources available. She became tearful once asked about goals of care and stated she knew he was far passed rehab being an option and they really just need aide services to assist at home. Family is not interested in placement and wants to keep him at home.  We discussed further the financial side of this care and the final decision was made to make referral to Hospice services. The daughter was very pleasant and states she is feeling overwhelmed and frustrated with the lack of resources available to help them. We reviewed Medicaid requirements and qualifications (patient does not qualify for Medicaid after quick review) for community resources and agreed to talk again Monday after I outreach to physicians and Hospice.   1/10 @ 10:42: Spoke with Dianna @ [place] for possible referral. Dianna will contact daughter and assess for possible services.     Expected Discharge Plan: [place] Barriers to Discharge: No Barriers Identified  Expected Discharge Plan and Services Expected Discharge Plan: [place] arrangements for the past 2 months: Single Family Home                                       Social Determinants of Health (SDOH) Interventions    Readmission Risk Interventions No flowsheet data found.

## 2020-12-11 ENCOUNTER — Emergency Department: Payer: Medicare Other

## 2020-12-11 ENCOUNTER — Observation Stay
Admission: EM | Admit: 2020-12-11 | Discharge: 2020-12-18 | Disposition: A | Discharge status: Home / Self Care | Payer: Medicare Other | Attending: Family Medicine | Admitting: Family Medicine

## 2020-12-11 ENCOUNTER — Encounter: Payer: Self-pay | Admitting: Family Medicine

## 2020-12-11 DIAGNOSIS — N189 Chronic kidney disease, unspecified: Secondary | ICD-10-CM | POA: Insufficient documentation

## 2020-12-11 DIAGNOSIS — Z7982 Long term (current) use of aspirin: Secondary | ICD-10-CM | POA: Diagnosis not present

## 2020-12-11 DIAGNOSIS — R296 Repeated falls: Secondary | ICD-10-CM

## 2020-12-11 DIAGNOSIS — J441 Chronic obstructive pulmonary disease with (acute) exacerbation: Secondary | ICD-10-CM | POA: Diagnosis not present

## 2020-12-11 DIAGNOSIS — J440 Chronic obstructive pulmonary disease with acute lower respiratory infection: Secondary | ICD-10-CM

## 2020-12-11 DIAGNOSIS — F0390 Unspecified dementia without behavioral disturbance: Secondary | ICD-10-CM | POA: Diagnosis not present

## 2020-12-11 DIAGNOSIS — Z23 Encounter for immunization: Secondary | ICD-10-CM | POA: Insufficient documentation

## 2020-12-11 DIAGNOSIS — Z79899 Other long term (current) drug therapy: Secondary | ICD-10-CM | POA: Insufficient documentation

## 2020-12-11 DIAGNOSIS — I10 Essential (primary) hypertension: Secondary | ICD-10-CM | POA: Diagnosis not present

## 2020-12-11 DIAGNOSIS — F32A Depression, unspecified: Secondary | ICD-10-CM

## 2020-12-11 DIAGNOSIS — J209 Acute bronchitis, unspecified: Secondary | ICD-10-CM | POA: Insufficient documentation

## 2020-12-11 DIAGNOSIS — F419 Anxiety disorder, unspecified: Secondary | ICD-10-CM | POA: Diagnosis not present

## 2020-12-11 DIAGNOSIS — G20 Parkinson's disease: Secondary | ICD-10-CM | POA: Diagnosis not present

## 2020-12-11 DIAGNOSIS — Z951 Presence of aortocoronary bypass graft: Secondary | ICD-10-CM | POA: Diagnosis not present

## 2020-12-11 DIAGNOSIS — Z8546 Personal history of malignant neoplasm of prostate: Secondary | ICD-10-CM | POA: Diagnosis not present

## 2020-12-11 DIAGNOSIS — R531 Weakness: Secondary | ICD-10-CM | POA: Insufficient documentation

## 2020-12-11 DIAGNOSIS — I129 Hypertensive chronic kidney disease with stage 1 through stage 4 chronic kidney disease, or unspecified chronic kidney disease: Secondary | ICD-10-CM | POA: Insufficient documentation

## 2020-12-11 DIAGNOSIS — M25552 Pain in left hip: Secondary | ICD-10-CM | POA: Diagnosis present

## 2020-12-11 DIAGNOSIS — Z20822 Contact with and (suspected) exposure to covid-19: Secondary | ICD-10-CM | POA: Diagnosis not present

## 2020-12-11 DIAGNOSIS — I2510 Atherosclerotic heart disease of native coronary artery without angina pectoris: Secondary | ICD-10-CM | POA: Diagnosis not present

## 2020-12-11 DIAGNOSIS — F17210 Nicotine dependence, cigarettes, uncomplicated: Secondary | ICD-10-CM | POA: Insufficient documentation

## 2020-12-11 LAB — COMPREHENSIVE METABOLIC PANEL
ALT: 15 U/L (ref 0–44)
AST: 18 U/L (ref 15–41)
Albumin: 3.7 g/dL (ref 3.5–5.0)
Alkaline Phosphatase: 49 U/L (ref 38–126)
Anion gap: 8 (ref 5–15)
BUN: 18 mg/dL (ref 8–23)
CO2: 28 mmol/L (ref 22–32)
Calcium: 9.4 mg/dL (ref 8.9–10.3)
Chloride: 102 mmol/L (ref 98–111)
Creatinine, Ser: 1.17 mg/dL (ref 0.61–1.24)
GFR, Estimated: >60 mL/min (ref >60)
Glucose, Bld: 92 mg/dL (ref 70–99)
Potassium: 4.5 mmol/L (ref 3.5–5.1)
Sodium: 138 mmol/L (ref 135–145)
Total Bilirubin: 1.5 mg/dL — ABNORMAL HIGH (ref 0.3–1.2)
Total Protein: 7 g/dL (ref 6.5–8.1)

## 2020-12-11 LAB — CBC WITH DIFFERENTIAL/PLATELET
Abs Immature Granulocytes: 0.04 10*3/uL (ref 0.00–0.07)
Basophils Absolute: 0.1 10*3/uL (ref 0.0–0.1)
Basophils Relative: 1 %
Eosinophils Absolute: 0.4 10*3/uL (ref 0.0–0.5)
Eosinophils Relative: 4 %
HCT: 39.5 % (ref 39.0–52.0)
Hemoglobin: 13.3 g/dL (ref 13.0–17.0)
Immature Granulocytes: 0 %
Lymphocytes Relative: 13 %
Lymphs Abs: 1.2 10*3/uL (ref 0.7–4.0)
MCH: 34.3 pg — ABNORMAL HIGH (ref 26.0–34.0)
MCHC: 33.7 g/dL (ref 30.0–36.0)
MCV: 101.8 fL — ABNORMAL HIGH (ref 80.0–100.0)
Monocytes Absolute: 0.5 10*3/uL (ref 0.1–1.0)
Monocytes Relative: 5 %
Neutro Abs: 7.1 10*3/uL (ref 1.7–7.7)
Neutrophils Relative %: 77 %
Platelets: 113 10*3/uL — ABNORMAL LOW (ref 150–400)
RBC: 3.88 MIL/uL — ABNORMAL LOW (ref 4.22–5.81)
RDW: 12.9 % (ref 11.5–15.5)
Smear Review: DECREASED
WBC: 9.2 10*3/uL (ref 4.0–10.5)
nRBC: 0 % (ref 0.0–0.2)

## 2020-12-11 LAB — RESP PANEL BY RT-PCR (FLU A&B, COVID) ARPGX2
Influenza A by PCR: NEGATIVE
Influenza B by PCR: NEGATIVE
SARS Coronavirus 2 by RT PCR: NEGATIVE

## 2020-12-11 MED ORDER — CRANBERRY 400 MG PO TABS: 800.0000 mg | ORAL_TABLET | Freq: Every day | ORAL | Status: DC

## 2020-12-11 MED ORDER — ACETAMINOPHEN 500 MG PO TABS
1000.0000 mg | ORAL_TABLET | Freq: Once | ORAL | Status: AC
  Administered 2020-12-11: 1000 mg via ORAL
  Filled 2020-12-11: qty 2

## 2020-12-11 MED ORDER — ONDANSETRON HCL 4 MG/2ML IJ SOLN: 4.0000 mg | Freq: Four times a day (QID) | INTRAMUSCULAR | Status: DC | PRN

## 2020-12-11 MED ORDER — PANTOPRAZOLE SODIUM 40 MG PO TBEC
40.0000 mg | DELAYED_RELEASE_TABLET | Freq: Two times a day (BID) | ORAL | Status: DC
  Administered 2020-12-11 – 2020-12-18 (×14): 40 mg via ORAL
  Filled 2020-12-11 (×14): qty 1

## 2020-12-11 MED ORDER — MAGNESIUM HYDROXIDE 400 MG/5ML PO SUSP: 30.0000 mL | Freq: Every day | ORAL | Status: DC | PRN

## 2020-12-11 MED ORDER — PREDNISONE 20 MG PO TABS
40.0000 mg | ORAL_TABLET | Freq: Every day | ORAL | Status: AC
  Administered 2020-12-13 – 2020-12-16 (×4): 40 mg via ORAL
  Filled 2020-12-11 (×4): qty 2

## 2020-12-11 MED ORDER — MIRTAZAPINE 15 MG PO TABS
30.0000 mg | ORAL_TABLET | Freq: Every day | ORAL | Status: DC
  Administered 2020-12-11 – 2020-12-17 (×7): 30 mg via ORAL
  Filled 2020-12-11 (×7): qty 2

## 2020-12-11 MED ORDER — ACETAMINOPHEN 325 MG PO TABS: 650.0000 mg | ORAL_TABLET | Freq: Four times a day (QID) | ORAL | Status: DC | PRN

## 2020-12-11 MED ORDER — HYDROCODONE-ACETAMINOPHEN 10-325 MG PO TABS
1.0000 | ORAL_TABLET | Freq: Four times a day (QID) | ORAL | Status: DC | PRN
  Administered 2020-12-13 – 2020-12-18 (×5): 1 via ORAL
  Filled 2020-12-11 (×5): qty 1

## 2020-12-11 MED ORDER — VITAMIN D 25 MCG (1000 UNIT) PO TABS
2000.0000 [IU] | ORAL_TABLET | Freq: Every day | ORAL | Status: DC
  Administered 2020-12-12 – 2020-12-18 (×7): 2000 [IU] via ORAL
  Filled 2020-12-11 (×7): qty 2

## 2020-12-11 MED ORDER — ENOXAPARIN SODIUM 40 MG/0.4ML IJ SOSY
40.0000 mg | PREFILLED_SYRINGE | INTRAMUSCULAR | Status: DC
  Administered 2020-12-12 – 2020-12-18 (×7): 40 mg via SUBCUTANEOUS
  Filled 2020-12-11 (×7): qty 0.4

## 2020-12-11 MED ORDER — VENLAFAXINE HCL ER 37.5 MG PO CP24
37.5000 mg | ORAL_CAPSULE | Freq: Every day | ORAL | Status: DC
  Administered 2020-12-12 – 2020-12-18 (×7): 37.5 mg via ORAL
  Filled 2020-12-11 (×7): qty 1

## 2020-12-11 MED ORDER — CLONAZEPAM 1 MG PO TABS
2.0000 mg | ORAL_TABLET | Freq: Every day | ORAL | Status: DC
  Administered 2020-12-11 – 2020-12-17 (×7): 2 mg via ORAL
  Filled 2020-12-11 (×5): qty 2
  Filled 2020-12-11: qty 4
  Filled 2020-12-11: qty 2

## 2020-12-11 MED ORDER — LOSARTAN POTASSIUM 50 MG PO TABS
50.0000 mg | ORAL_TABLET | Freq: Every day | ORAL | Status: DC
  Administered 2020-12-12 – 2020-12-17 (×6): 50 mg via ORAL
  Filled 2020-12-11 (×7): qty 1

## 2020-12-11 MED ORDER — NITROGLYCERIN 0.4 MG SL SUBL: 0.4000 mg | SUBLINGUAL_TABLET | SUBLINGUAL | Status: DC | PRN

## 2020-12-11 MED ORDER — SERTRALINE HCL 50 MG PO TABS
25.0000 mg | ORAL_TABLET | Freq: Every day | ORAL | Status: DC
  Administered 2020-12-12 – 2020-12-18 (×7): 25 mg via ORAL
  Filled 2020-12-11 (×7): qty 1

## 2020-12-11 MED ORDER — ONDANSETRON HCL 4 MG PO TABS: 4.0000 mg | ORAL_TABLET | Freq: Four times a day (QID) | ORAL | Status: DC | PRN

## 2020-12-11 MED ORDER — SODIUM CHLORIDE 0.9 % IV SOLN
1.0000 g | INTRAVENOUS | Status: DC
Start: 2020-12-11 — End: 2020-12-13
  Administered 2020-12-11 – 2020-12-12 (×2): 1 g via INTRAVENOUS
  Filled 2020-12-11 (×3): qty 10

## 2020-12-11 MED ORDER — METHYLPREDNISOLONE SODIUM SUCC 125 MG IJ SOLR
125.0000 mg | Freq: Once | INTRAMUSCULAR | Status: AC
  Administered 2020-12-11: 125 mg via INTRAVENOUS
  Filled 2020-12-11: qty 2

## 2020-12-11 MED ORDER — ADULT MULTIVITAMIN W/MINERALS CH
1.0000 | ORAL_TABLET | Freq: Every day | ORAL | Status: DC
  Administered 2020-12-12 – 2020-12-18 (×7): 1 via ORAL
  Filled 2020-12-11 (×7): qty 1

## 2020-12-11 MED ORDER — CARVEDILOL 12.5 MG PO TABS
12.5000 mg | ORAL_TABLET | Freq: Two times a day (BID) | ORAL | Status: DC
  Administered 2020-12-12 – 2020-12-18 (×13): 12.5 mg via ORAL
  Filled 2020-12-11 (×13): qty 1

## 2020-12-11 MED ORDER — ACETAMINOPHEN 650 MG RE SUPP: 650.0000 mg | Freq: Four times a day (QID) | RECTAL | Status: DC | PRN

## 2020-12-11 MED ORDER — VITAMIN B-12 1000 MCG PO TABS
2000.0000 ug | ORAL_TABLET | Freq: Every day | ORAL | Status: DC
  Administered 2020-12-12 – 2020-12-18 (×7): 2000 ug via ORAL
  Filled 2020-12-11 (×8): qty 2

## 2020-12-11 MED ORDER — GUAIFENESIN ER 600 MG PO TB12
600.0000 mg | ORAL_TABLET | Freq: Two times a day (BID) | ORAL | Status: DC
  Administered 2020-12-11 – 2020-12-18 (×14): 600 mg via ORAL
  Filled 2020-12-11 (×14): qty 1

## 2020-12-11 MED ORDER — METHYLPREDNISOLONE SODIUM SUCC 125 MG IJ SOLR
80.0000 mg | Freq: Two times a day (BID) | INTRAMUSCULAR | Status: AC
  Administered 2020-12-12: 80 mg via INTRAVENOUS
  Filled 2020-12-11: qty 2

## 2020-12-11 MED ORDER — IPRATROPIUM-ALBUTEROL 0.5-2.5 (3) MG/3ML IN SOLN
3.0000 mL | Freq: Once | RESPIRATORY_TRACT | Status: AC
  Administered 2020-12-11: 3 mL via RESPIRATORY_TRACT
  Filled 2020-12-11: qty 3

## 2020-12-11 MED ORDER — APOAEQUORIN 10 MG PO CAPS: 1.0000 | ORAL_CAPSULE | Freq: Every day | ORAL | Status: DC

## 2020-12-11 MED ORDER — SODIUM CHLORIDE 0.9 % IV SOLN: INTRAVENOUS | Status: DC

## 2020-12-11 MED ORDER — TAMSULOSIN HCL 0.4 MG PO CAPS
0.4000 mg | ORAL_CAPSULE | Freq: Every day | ORAL | Status: DC
  Administered 2020-12-12 – 2020-12-18 (×7): 0.4 mg via ORAL
  Filled 2020-12-11 (×7): qty 1

## 2020-12-11 MED ORDER — SUCRALFATE 1 G PO TABS
1.0000 g | ORAL_TABLET | Freq: Three times a day (TID) | ORAL | Status: DC
  Administered 2020-12-12 – 2020-12-18 (×16): 1 g via ORAL
  Filled 2020-12-11 (×16): qty 1

## 2020-12-11 MED ORDER — HYDROCOD POLST-CPM POLST ER 10-8 MG/5ML PO SUER: 5.0000 mL | Freq: Two times a day (BID) | ORAL | Status: DC | PRN

## 2020-12-11 MED ORDER — NUTRISOURCE FIBER PO PACK
PACK | Freq: Every day | ORAL | Status: DC
Start: 2020-12-12 — End: 2020-12-18
  Administered 2020-12-12 – 2020-12-18 (×7): 1 via ORAL
  Filled 2020-12-11 (×7): qty 1

## 2020-12-11 MED ORDER — ASPIRIN 81 MG PO CHEW
81.0000 mg | CHEWABLE_TABLET | Freq: Every day | ORAL | Status: DC
  Administered 2020-12-12 – 2020-12-18 (×7): 81 mg via ORAL
  Filled 2020-12-11 (×7): qty 1

## 2020-12-11 MED ORDER — ZOLPIDEM TARTRATE 5 MG PO TABS: 5.0000 mg | ORAL_TABLET | Freq: Every evening | ORAL | Status: DC | PRN

## 2020-12-11 NOTE — ED Triage Notes (Signed)
Pt to ED via [facility] from home after witnessed fall. Pt's wife states fell out of bed. Pt hx Parkinson's. Pt recently at [facility]. Pt c/o left hip pain. Pt with lumbar spinal fusion. Pt with 2 falls last week. Wife states pt seems weaker than normal. Not currently on blood thinners. Pt alert but unable to answer this Rns questions.   Pt hx COPD, HTN

## 2020-12-11 NOTE — H&P (Signed)
[place]   PATIENT NAME: Douglas Clark    MR#:  [id number]  DATE OF BIRTH:  1940-07-06  DATE OF ADMISSION:  12/11/2020  PRIMARY CARE PHYSICIAN: Idelle Crouch, MD   Patient is coming from: Home  REQUESTING/REFERRING PHYSICIAN: Vladimir Crofts, MD  CHIEF COMPLAINT:   Chief Complaint  Patient presents with   Fall    HISTORY OF PRESENT ILLNESS:  Douglas Clark is a 80 y.o. Caucasian male with medical history significant for coronary artery disease, GERD, emphysema, dementia, and anxiety, osteoarthritis, hypertension, [name], who presented to the emergency room with acute onset of fall out of bed earlier today and increasing weakness lately.  The patient was at [facility] for 1 week last week to give his wife a break as it has been difficult for her to care for him at home.  He has been home a couple of days ago.  It has been difficult for her to get him around as he has been reliant on his wheelchair and others for ADLs.  He stated that when he fell today and hit his head but denies any presyncope or syncope.  No paresthesias or focal muscle weakness.  He was complaining of left hip pain after his fall.  He has been having increasing cough productive of greenish sputum lately with associated dyspnea with occasional wheezing.  No fever or chills.  No chest pain or palpitations.  No nausea or vomiting or abdominal pain.  No dysuria, oliguria or hematuria or flank pain.  ED Course: Upon presentation to the ER, vital signs were within normal.  Labs revealed unremarkable CMP and CBC that only showed macrocytosis.  Influenza antigens and COVID-19 PCR came back negative.    Imaging: Chest x-ray showed no acute cardiopulmonary disease.  Left hip x-ray showed no acute abnormalities.  Left hip CT showed no osseous abnormality.  The patient was given 1 g of p.o. Tylenol, DuoNeb and 125 mg IV Solu-Medrol.  He will be admitted to an observation medically  monitored bed for further evaluation and management. PAST MEDICAL HISTORY:   Past Medical History:  Diagnosis Date   Anxiety    Arthritis    BBB (bundle branch block)    RIGHT AND LEFT   Cancer ([place])    Prostate Cancer   Chronic fatigue and malaise    with STM loss   Chronic kidney disease    stone   Concussion 1993   with long term memory loss   Coronary artery disease    Dementia (HCC)    Diverticulosis    Emphysema lung (HCC)    GERD (gastroesophageal reflux disease)    HH (hiatus hernia)    History of kidney stones    Hyperlipidemia    Hypertension    Myocardial infarction ([place])    Parkinson disease ([place])    Weight loss     PAST SURGICAL HISTORY:   Past Surgical History:  Procedure Laterality Date   CARDIAC CATHETERIZATION     COLONOSCOPY WITH PROPOFOL N/A 02/16/2016   Procedure: COLONOSCOPY WITH PROPOFOL;  Surgeon: Lollie Sails, MD;  Location: [facility] ENDOSCOPY;  Service: Endoscopy;  Laterality: N/A;   CORONARY ARTERY BYPASS GRAFT     2000       Denies     ESOPHAGOGASTRODUODENOSCOPY (EGD) WITH PROPOFOL N/A 02/16/2016   Procedure: ESOPHAGOGASTRODUODENOSCOPY (EGD) WITH PROPOFOL;  Surgeon: Lollie Sails, MD;  Location: [facility] ENDOSCOPY;  Service: Endoscopy;  Laterality: N/A;  ESOPHAGOGASTRODUODENOSCOPY (EGD) WITH PROPOFOL N/A 02/18/2017   Procedure: ESOPHAGOGASTRODUODENOSCOPY (EGD) WITH PROPOFOL;  Surgeon: Lollie Sails, MD;  Location: [facility] ENDOSCOPY;  Service: Endoscopy;  Laterality: N/A;   KIDNEY SURGERY     REMOVED STONE FROM RT KIDNEY   LITHOTRIPSY     X2    LUMBAR LAMINECTOMY/DECOMPRESSION MICRODISCECTOMY Bilateral 02/04/2015   Procedure: LUMBAR LAMINECTOMY/DECOMPRESSION MICRODISCECTOMY  Bilateral - Lumbar two-three lumbar three-four lumbar four-five lumbar five sacral one ;  Surgeon: Leeroy Cha, MD;  Location: [facility] NEURO ORS;  Service: Neurosurgery;  Laterality: Bilateral;   PILONIDAL CYST / SINUS EXCISION      SOCIAL HISTORY:   Social  History   Tobacco Use   Smoking status: Every Day    Packs/day: 1.00    Types: Cigarettes   Smokeless tobacco: Never  Substance Use Topics   Alcohol use: Not Currently    Comment: OCC    FAMILY HISTORY:   Family History  Problem Relation Age of Onset   Varicose Veins Mother    Heart disease Father     DRUG ALLERGIES:   Allergies  Allergen Reactions   Donepezil    Doxazosin Hives   Trazodone And Nefazodone Other (See Comments)    shivers   Hctz [Hydrochlorothiazide] Rash    blisters    REVIEW OF SYSTEMS:   ROS As per history of present illness. All pertinent systems were reviewed above. Constitutional, HEENT, cardiovascular, respiratory, GI, GU, musculoskeletal, neuro, psychiatric, endocrine, integumentary and hematologic systems were reviewed and are otherwise negative/unremarkable except for positive findings mentioned above in the HPI.   MEDICATIONS AT HOME:   Prior to Admission medications   Medication Sig Start Date End Date Taking? Authorizing Provider  amitriptyline (ELAVIL) 25 MG tablet Take 25 mg by mouth at bedtime.   Yes [provider]  Apoaequorin 10 MG CAPS Take 1 capsule by mouth daily.   Yes [provider]  aspirin 81 MG chewable tablet Chew 81 mg by mouth daily.   Yes [provider]  carvedilol (COREG) 12.5 MG tablet Take 1 tablet (12.5 mg total) by mouth 2 (two) times daily with a meal. 12/17/13  Yes Belva Crome, MD  cholecalciferol (VITAMIN D) 1000 UNITS tablet Take 2,000 Units by mouth daily.   Yes [provider]  clonazePAM (KLONOPIN) 2 MG tablet Take 2 mg by mouth at bedtime. 11/20/14  Yes [provider]  Cranberry 400 MG TABS Take 800 mg by mouth daily.   Yes [provider]  HYDROcodone-acetaminophen (NORCO) 10-325 MG tablet Take 1 tablet by mouth every 6 (six) hours as needed for moderate pain. for pain 01/09/18  Yes [provider]  losartan (COZAAR) 50 MG tablet Take 50 mg  by mouth daily.  03/26/15 01/20/26 Yes [provider]  mirtazapine (REMERON) 30 MG tablet Take 30 mg by mouth at bedtime.   Yes [provider]  Multiple Vitamin (MULTI-VITAMINS) TABS Take 1 tablet by mouth daily.   Yes [provider]  nitroGLYCERIN (NITROSTAT) 0.4 MG SL tablet Place 1 tablet (0.4 mg total) under the tongue every 5 (five) minutes as needed for chest pain. 12/13/13  Yes Belva Crome, MD  pantoprazole (PROTONIX) 40 MG tablet Take 40 mg by mouth 2 (two) times daily. 07/11/17  Yes [provider]  sertraline (ZOLOFT) 25 MG tablet Take 25 mg by mouth daily. 12/06/20  Yes [provider]  sucralfate (CARAFATE) 1 g tablet Take 1 g by mouth 3 (three) times daily after  meals. 01/09/20  Yes [provider]  tamsulosin (FLOMAX) 0.4 MG CAPS capsule Take 0.4 mg by mouth daily. 01/09/20  Yes [provider]  venlafaxine XR (EFFEXOR-XR) 37.5 MG 24 hr capsule Take 37.5 mg by mouth daily. 01/09/20  Yes [provider]  vitamin B-12 (CYANOCOBALAMIN) 1000 MCG tablet Take 2,000 mcg by mouth daily.   Yes [provider]  Wheat Dextrin (BENEFIBER DRINK MIX PO) Take 5 mLs by mouth daily.   Yes [provider]      VITAL SIGNS:  Blood pressure 138/79, pulse 71, temperature (!) 97.5 F (36.4 C), temperature source Oral, resp. rate 17, SpO2 98 %.  PHYSICAL EXAMINATION:  Physical Exam  GENERAL:  80 y.o.-year-old Caucasian patient lying in the bed with no acute distress.  EYES: Pupils equal, round, reactive to light and accommodation. No scleral icterus. Extraocular muscles intact.  HEENT: Head atraumatic, normocephalic. Oropharynx and nasopharynx clear.  NECK:  Supple, no jugular venous distention. No thyroid enlargement, no tenderness.  LUNGS: Diminished expiratory airflow with prolonged expiratory phase/harsh vesicular breathing and occasional rhonchi and wheezes. CARDIOVASCULAR: Regular rate and rhythm, S1, S2  normal. No murmurs, rubs, or gallops.  ABDOMEN: Soft, nondistended, nontender. Bowel sounds present. No organomegaly or mass.  EXTREMITIES: No pedal edema, cyanosis, or clubbing.  NEUROLOGIC: Cranial nerves II through XII are intact. Muscle strength 5/5 in all extremities. Sensation intact. Gait not checked.  PSYCHIATRIC: The patient is alert and oriented x 3.  Normal affect and good eye contact. SKIN: No obvious rash, lesion, or ulcer.   LABORATORY PANEL:   CBC Recent Labs  Lab 12/11/20 1944  WBC 9.2  HGB 13.3  HCT 39.5  PLT 113*    ------------------------------------------------------------------------------------------------------------------  Chemistries  Recent Labs  Lab 12/11/20 1944  NA 138  K 4.5  CL 102  CO2 28  GLUCOSE 92  BUN 18  CREATININE 1.17  CALCIUM 9.4  AST 18  ALT 15  ALKPHOS 49  BILITOT 1.5*    ------------------------------------------------------------------------------------------------------------------  Cardiac Enzymes No results for input(s): TROPONINI in the last 168 hours. ------------------------------------------------------------------------------------------------------------------  RADIOLOGY:  CT Hip Left Wo Contrast  Result Date: 12/11/2020 CLINICAL DATA:  Fall. EXAM: CT OF THE LEFT HIP WITHOUT CONTRAST TECHNIQUE: Multidetector CT imaging of the left hip was performed according to the standard protocol. Multiplanar CT image reconstructions were also generated. COMPARISON:  Left hip x-rays from same day. FINDINGS: Bones/Joint/Cartilage No fracture or dislocation. Mild left hip degenerative changes. Osteopenia. No joint effusion. Ligaments Ligaments are suboptimally evaluated by CT. Muscles and Tendons Grossly intact. Soft tissue No fluid collection or hematoma.  No soft tissue mass. IMPRESSION: 1. No acute osseous abnormality. Electronically Signed   By: Titus Dubin M.D.   On: 12/11/2020 19:23   DG Chest Portable 1 View  Result  Date: 12/11/2020 CLINICAL DATA:  COPD. EXAM: PORTABLE CHEST 1 VIEW COMPARISON:  Chest radiograph dated 01/22/2018. FINDINGS: There is diffuse chronic interstitial coarsening and bronchitic changes. No focal consolidation, pleural effusion, or pneumothorax. The cardiac silhouette is within limits. Atherosclerotic calcification of the aorta. Median sternotomy wires. No acute osseous pathology. IMPRESSION: No active disease. Electronically Signed   By: Anner Crete M.D.   On: 12/11/2020 19:10   DG Hip Unilat With Pelvis 2-3 Views Left  Result Date: 12/11/2020 CLINICAL DATA:  Fall, left hip pain EXAM: DG HIP (WITH OR WITHOUT PELVIS) 2-3V LEFT COMPARISON:  None. FINDINGS: Osteopenia. No displaced fracture or dislocation of the left hip. Mild superior joint space narrowing  osteophytosis. The partially imaged pelvis and proximal right femur are unremarkable. IMPRESSION: No displaced fracture or dislocation of the left hip. Please note that plain radiographs are significantly insensitive for hip and pelvic fracture, particularly in the setting of osteopenia. Recommend CT or MRI to more sensitively evaluate for fracture if clinically suspected. Electronically Signed   By: Eddie Candle M.D.   On: 12/11/2020 15:45      IMPRESSION AND PLAN:  Active Problems:   COPD exacerbation ([place])  1.  COPD acute exacerbation like secondary to acute bronchitis. - The patient will be admitted to a medical monitored observation bed. - We will continue nebulized bronchodilator therapy with DuoNebs 4 times daily and every 4 hours as needed. - Mucolytic therapy will be provided. - Steroid therapy will be provided with IV Solu-Medrol. - We will place him on IV Rocephin.  2.  Fall with subsequent left hip pain and recent generalized weakness. - Physical therapy consult to be obtained. - The patient has no evidence for hip fracture on x-ray or CT scan of the left hip.  3.  Essential hypertension. - We will continue Coreg  and Cozaar.  4.  Anxiety and depression. - We will continue Klonopin and, Zoloft and Effexor XR.  5.  BPH. - We will continue Flomax.  6.  Vitamin B12 deficiency. - We will continue vitamin B12.  7.  GERD. - We will continue Carafate.  DVT prophylaxis: Lovenox.  Code Status: full code.  Family Communication:  The plan of care was discussed in details with the patient (and family). I answered all questions. The patient agreed to proceed with the above mentioned plan. Further management will depend upon hospital course. Disposition Plan: Back to previous home environment Consults called: none.  All the records are reviewed and case discussed with ED provider.  Status is: Observation  The patient remains OBS appropriate and will d/c before 2 midnights.  Dispo: The patient is from: Home              Anticipated d/c is to: Home              Patient currently is not medically stable to d/c.   Difficult to place patient No   TOTAL TIME TAKING CARE OF THIS PATIENT: 55 minutes.    Christel Mormon M.D on 12/11/2020 at 10:47 PM  [facility]   From 7 PM-7 AM, contact night-coverage [email or web address]  CC: Primary care physician; Idelle Crouch, MD

## 2020-12-11 NOTE — ED Provider Notes (Signed)
[facility] Emergency Department Provider Note ____________________________________________   Event Date/Time   First MD Initiated Contact with Patient 12/11/20 1802     (approximate)  I have reviewed the triage vital signs and the nursing notes.  HISTORY  Chief Complaint Fall   HPI Douglas Clark is a 80 y.o. malewho presents to the ED for evaluation of fall and hip pain.   Chart review indicates CAD s/p remote bypass, HTN, Parkinson's disease.  Aspirin only documented thinner.  Wife brings patient to the ED for evaluation of increasing weakness and a fall out of bed that occurred earlier today.  Wife reports significant concern that she can longer care for the patient effectively at home.  Patient was at [facility] for [facility] week, last week, to give wife a break at home, and patient just came home 2 days ago.  Wife reports that in these 2 days she has had a significant difficulty getting him around as he is totally nonambulatory and reliant on wheelchair and others for ADLs.  She reports that he rolled out of bed and struck his left hip on the floor last night.  She had difficulty getting him up and about after this.  Patient reports that he is a smoker and continues to smoke.  Reports some shortness of breath and nonproductive cough.  Reporting left hip pain, 5/10 intensity, aching and nonradiating  Past Medical History:  Diagnosis Date   Anxiety    Arthritis    BBB (bundle branch block)    RIGHT AND LEFT   Cancer (HCC)    Prostate Cancer   Chronic fatigue and malaise    with STM loss   Chronic kidney disease    stone   Concussion 1993   with long term memory loss   Coronary artery disease    Dementia (HCC)    Diverticulosis    Emphysema lung (HCC)    GERD (gastroesophageal reflux disease)    HH (hiatus hernia)    History of kidney stones    Hyperlipidemia    Hypertension    Myocardial infarction ([place])    Parkinson disease ([place])     Weight loss     Patient Active Problem List   Diagnosis Date Noted   Prostate cancer ([place]) 05/09/2018   Pneumonia 01/21/2018   Parkinsonism ([place]) 07/15/2015   CTS (carpal tunnel syndrome) 07/10/2015   Neck pain 06/24/2015   Memory loss 06/11/2015   Abnormality of gait 06/11/2015   Lumbar stenosis with neurogenic claudication 02/04/2015   Bifascicular block 12/13/2013   CAD (coronary artery disease) of artery bypass graft 12/13/2013   Essential hypertension 12/13/2013   Anxiety and depression 12/13/2013   Tobacco use 12/13/2013    Past Surgical History:  Procedure Laterality Date   CARDIAC CATHETERIZATION     COLONOSCOPY WITH PROPOFOL N/A 02/16/2016   Procedure: COLONOSCOPY WITH PROPOFOL;  Surgeon: Lollie Sails, MD;  Location: [facility] ENDOSCOPY;  Service: Endoscopy;  Laterality: N/A;   CORONARY ARTERY BYPASS GRAFT     2000       Denies     ESOPHAGOGASTRODUODENOSCOPY (EGD) WITH PROPOFOL N/A 02/16/2016   Procedure: ESOPHAGOGASTRODUODENOSCOPY (EGD) WITH PROPOFOL;  Surgeon: Lollie Sails, MD;  Location: [facility] ENDOSCOPY;  Service: Endoscopy;  Laterality: N/A;   ESOPHAGOGASTRODUODENOSCOPY (EGD) WITH PROPOFOL N/A 02/18/2017   Procedure: ESOPHAGOGASTRODUODENOSCOPY (EGD) WITH PROPOFOL;  Surgeon: Lollie Sails, MD;  Location: [facility] ENDOSCOPY;  Service: Endoscopy;  Laterality: N/A;   KIDNEY SURGERY  REMOVED STONE FROM RT KIDNEY   LITHOTRIPSY     X2    LUMBAR LAMINECTOMY/DECOMPRESSION MICRODISCECTOMY Bilateral 02/04/2015   Procedure: LUMBAR LAMINECTOMY/DECOMPRESSION MICRODISCECTOMY  Bilateral - Lumbar two-three lumbar three-four lumbar four-five lumbar five sacral one ;  Surgeon: Leeroy Cha, MD;  Location: [place] NEURO ORS;  Service: Neurosurgery;  Laterality: Bilateral;   PILONIDAL CYST / SINUS EXCISION      Prior to Admission medications   Medication Sig Start Date End Date Taking? Authorizing Provider  amitriptyline (ELAVIL) 25 MG tablet Take 25 mg by mouth at bedtime.     [provider]  Apoaequorin 10 MG CAPS Take 1 capsule by mouth daily.    [provider]  aspirin 81 MG chewable tablet Chew 81 mg by mouth daily.    [provider]  carvedilol (COREG) 12.5 MG tablet Take 1 tablet (12.5 mg total) by mouth 2 (two) times daily with a meal. 12/17/13   Belva Crome, MD  cholecalciferol (VITAMIN D) 1000 UNITS tablet Take 2,000 Units by mouth daily.    [provider]  clonazePAM (KLONOPIN) 2 MG tablet Take 2 mg by mouth at bedtime. 11/20/14   [provider]  Cranberry 400 MG TABS Take 800 mg by mouth daily.    [provider]  HYDROcodone-acetaminophen (NORCO) 10-325 MG tablet Take 1 tablet by mouth every 6 (six) hours as needed for moderate pain. for pain 01/09/18   [provider]  losartan (COZAAR) 50 MG tablet Take 50 mg by mouth daily.  03/26/15 01/20/26  [provider]  mirtazapine (REMERON) 30 MG tablet Take 30 mg by mouth at bedtime.    [provider]  Multiple Vitamin (MULTI-VITAMINS) TABS Take 1 tablet by mouth daily.    [provider]  nitroGLYCERIN (NITROSTAT) 0.4 MG SL tablet Place 1 tablet (0.4 mg total) under the tongue every 5 (five) minutes as needed for chest pain. 12/13/13   Belva Crome, MD  pantoprazole (PROTONIX) 40 MG tablet Take 40 mg by mouth 2 (two) times daily. 07/11/17   [provider]  sucralfate (CARAFATE) 1 g tablet Take 1 g by mouth 3 (three) times daily after meals. 01/09/20   [provider]  tamsulosin (FLOMAX) 0.4 MG CAPS capsule Take 0.4 mg by mouth daily. 01/09/20   [provider]  venlafaxine XR (EFFEXOR-XR) 37.5 MG 24 hr capsule Take 37.5 mg by mouth daily. 01/09/20   [provider]  vitamin B-12 (CYANOCOBALAMIN) 1000 MCG tablet Take 2,000 mcg by mouth daily.    [provider]  Wheat Dextrin (BENEFIBER DRINK MIX PO) Take 5 mLs by mouth daily.    [provider]     Allergies Donepezil, Doxazosin, Trazodone and nefazodone, and Hctz [hydrochlorothiazide]  Family History  Problem Relation Age of Onset   Varicose Veins Mother    Heart disease Father     Social History Social History   Tobacco Use   Smoking status: Every Day    Packs/day: 1.00    Types: Cigarettes   Smokeless tobacco: Never  Vaping Use   Vaping Use: Never used  Substance Use Topics   Alcohol use: Not Currently    Comment: OCC   Drug use: Not Currently    Review of Systems  Constitutional: No fever/chills Eyes: No visual changes. ENT: No sore throat. Cardiovascular: Denies chest pain. Respiratory: Shortness of breath, cough Gastrointestinal: No abdominal pain.  No nausea, no vomiting.  No diarrhea.  No constipation. Genitourinary: Negative for  dysuria. Musculoskeletal: Negative for back pain. Positive for left hip pain after a mechanical fall Skin: Negative for rash. Neurological: Negative for headaches, focal weakness or numbness.  ____________________________________________   PHYSICAL EXAM:  VITAL SIGNS: Vitals:   12/11/20 1429  BP: 122/73  Pulse: 80  Resp: 20  Temp: (!) 97.5 F (36.4 C)  SpO2: 99%     Constitutional: Alert and oriented.  Thin.  Slow movement and responses. Eyes: Conjunctivae are normal. PERRL. EOMI. Head: Atraumatic. Nose: No congestion/rhinnorhea. Mouth/Throat: Mucous membranes are moist.  Oropharynx non-erythematous. Neck: No stridor. No cervical spine tenderness to palpation. Cardiovascular: Normal rate, regular rhythm. Grossly normal heart sounds.  Good peripheral circulation. Respiratory: Minimal tachypnea to the low 20s.  Moderately decreased air movement throughout with prolonged expiratory phase. Gastrointestinal: Soft , nondistended, nontender to palpation. No CVA tenderness. Musculoskeletal: No lower extremity tenderness nor edema.  No joint effusions.  Mild and poorly localizing tenderness to the left hip, and left  proximal leg with logrolling.  Left leg is distally neurovascularly intact.  No laceration or open injuries. Neurologic:  No gross focal neurologic deficits are appreciated.  Parkinsonism is noted. Skin:  Skin is warm, dry and intact. No rash noted. Psychiatric: Mood and affect are normal. Speech and behavior are normal.  ____________________________________________   LABS (all labs ordered are listed, but only abnormal results are displayed)  Labs Reviewed - No data to display ____________________________________________  12 Lead EKG   ____________________________________________  RADIOLOGY  ED MD interpretation: Plain film of the pelvis and left hip reviewed by me without fracture apparent 1 view CXR reviewed by me with increased interstitial changes throughout.  Official radiology report(s): DG Hip Unilat With Pelvis 2-3 Views Left  Result Date: 12/11/2020 CLINICAL DATA:  Fall, left hip pain EXAM: DG HIP (WITH OR WITHOUT PELVIS) 2-3V LEFT COMPARISON:  None. FINDINGS: Osteopenia. No displaced fracture or dislocation of the left hip. Mild superior joint space narrowing osteophytosis. The partially imaged pelvis and proximal right femur are unremarkable. IMPRESSION: No displaced fracture or dislocation of the left hip. Please note that plain radiographs are significantly insensitive for hip and pelvic fracture, particularly in the setting of osteopenia. Recommend CT or MRI to more sensitively evaluate for fracture if clinically suspected. Electronically Signed   By: Eddie Candle M.D.   On: 12/11/2020 15:45    ____________________________________________   PROCEDURES and INTERVENTIONS  Procedure(s) performed (including Critical Care):  Procedures  Medications - No data to display  ____________________________________________   MDM / ED COURSE   80 year old male with COPD and parkinsonism presents to the ED due to recurrent falls, with evidence of a COPD exacerbation,  requiring medical observation admission.  Normal vitals on room air.  Exam with stigmata of COPD exacerbation.  Has some pain with logrolling to the left leg, suspicious for hip fracture, but no signs of neurologic or vascular deficits.  No signs of additional acute trauma.  Imaging without evidence of fracture to this left hip or otherwise.  Blood work is benign.  No evidence of CAP or PTX.  Due to his continued wheezing and stigmata of COPD exacerbation, we will admit for further work-up and management.      ____________________________________________   FINAL CLINICAL IMPRESSION(S) / ED DIAGNOSES  Final diagnoses:  None     ED Discharge Orders     None        Roseana Rhine   Note:  This document was prepared using Dragon voice recognition software and may include  unintentional dictation errors.    Vladimir Crofts, MD 12/11/20 [id number]

## 2020-12-11 NOTE — ED Triage Notes (Signed)
Pt comes into the ED via EMS from home, wife reported that she was assisted him from the bed and he was weak and fell, unable to stand today due to have left hip pain  123/73 83HR 96%RA

## 2020-12-11 NOTE — H&P (Addendum)
Douglas Clark   PATIENT NAME: Douglas Clark    MR#:  [id number]  DATE OF BIRTH:  1940-05-07  DATE OF ADMISSION:  12/11/2020  PRIMARY CARE PHYSICIAN: Idelle Crouch, MD   Patient is coming from: Home  REQUESTING/REFERRING PHYSICIAN: Vladimir Crofts, MD  CHIEF COMPLAINT:   Chief Complaint  Patient presents with   Fall    HISTORY OF PRESENT ILLNESS:  Douglas Clark is a 80 y.o. Caucasian male with medical history significant for coronary artery disease, GERD, emphysema, dementia, and anxiety, osteoarthritis, hypertension, [name], who presented to the emergency room with acute onset of fall out of bed earlier today and increasing weakness lately.  The patient was at [facility] week last week to give his wife a break as it has been difficult for her to care for him.  Home a couple of days ago.  It has been difficult for her to get him around as he has been reliant on his wheelchair and others for ADLs.  He stated that when he fell today he hit his head but denies any presyncope or syncope.  No paresthesias or focal muscle weakness.  He was complaining of left hip pain after his fall.  He has been having increasing cough productive of greenish sputum lately with associated dyspnea with occasional wheezing.  No fever or chills.  No chest pain or palpitations.  No nausea or vomiting or abdominal pain.  No dysuria, oliguria or hematuria or flank pain.  ED Course: Upon presentation to the ER, vital signs were within normal.  Labs revealed unremarkable CMP and CBC that only showed macrocytosis.  Influenza antigens and COVID-19 PCR came back negative.    Imaging: Chest x-ray showed no acute cardiopulmonary disease.  Left hip x-ray showed no acute abnormalities.  Left hip CT showed no osseous abnormality.  The patient was given 1 g of p.o. Tylenol, DuoNeb and 125 mg IV Solu-Medrol.  He will be admitted to an observation medically monitored bed for  further evaluation and management. PAST MEDICAL HISTORY:   Past Medical History:  Diagnosis Date   Anxiety    Arthritis    BBB (bundle branch block)    RIGHT AND LEFT   Cancer ([place])    Prostate Cancer   Chronic fatigue and malaise    with STM loss   Chronic kidney disease    stone   Concussion 1993   with long term memory loss   Coronary artery disease    Dementia ([facility])    Diverticulosis    Emphysema lung ([facility])    GERD (gastroesophageal reflux disease)    HH (hiatus hernia)    History of kidney stones    Hyperlipidemia    Hypertension    Myocardial infarction ([place])    Parkinson disease ([place])    Weight loss     PAST SURGICAL HISTORY:   Past Surgical History:  Procedure Laterality Date   CARDIAC CATHETERIZATION     COLONOSCOPY WITH PROPOFOL N/A 02/16/2016   Procedure: COLONOSCOPY WITH PROPOFOL;  Surgeon: Lollie Sails, MD;  Location: [facility] ENDOSCOPY;  Service: Endoscopy;  Laterality: N/A;   CORONARY ARTERY BYPASS GRAFT     2000       Denies     ESOPHAGOGASTRODUODENOSCOPY (EGD) WITH PROPOFOL N/A 02/16/2016   Procedure: ESOPHAGOGASTRODUODENOSCOPY (EGD) WITH PROPOFOL;  Surgeon: Lollie Sails, MD;  Location: [facility] ENDOSCOPY;  Service: Endoscopy;  Laterality: N/A;   ESOPHAGOGASTRODUODENOSCOPY (EGD) WITH PROPOFOL  N/A 02/18/2017   Procedure: ESOPHAGOGASTRODUODENOSCOPY (EGD) WITH PROPOFOL;  Surgeon: Lollie Sails, MD;  Location: [facility] ENDOSCOPY;  Service: Endoscopy;  Laterality: N/A;   KIDNEY SURGERY     REMOVED STONE FROM RT KIDNEY   LITHOTRIPSY     X2    LUMBAR LAMINECTOMY/DECOMPRESSION MICRODISCECTOMY Bilateral 02/04/2015   Procedure: LUMBAR LAMINECTOMY/DECOMPRESSION MICRODISCECTOMY  Bilateral - Lumbar two-three lumbar three-four lumbar four-five lumbar five sacral one ;  Surgeon: Leeroy Cha, MD;  Location: [place] NEURO ORS;  Service: Neurosurgery;  Laterality: Bilateral;   PILONIDAL CYST / SINUS EXCISION      SOCIAL HISTORY:   Social History   Tobacco Use    Smoking status: Every Day    Packs/day: 1.00    Types: Cigarettes   Smokeless tobacco: Never  Substance Use Topics   Alcohol use: Not Currently    Comment: OCC    FAMILY HISTORY:   Family History  Problem Relation Age of Onset   Varicose Veins Mother    Heart disease Father     DRUG ALLERGIES:   Allergies  Allergen Reactions   Donepezil    Doxazosin Hives   Trazodone And Nefazodone Other (See Comments)    shivers   Hctz [Hydrochlorothiazide] Rash    blisters    REVIEW OF SYSTEMS:   ROS As per history of present illness. All pertinent systems were reviewed above. Constitutional, HEENT, cardiovascular, respiratory, GI, GU, musculoskeletal, neuro, psychiatric, endocrine, integumentary and hematologic systems were reviewed and are otherwise negative/unremarkable except for positive findings mentioned above in the HPI.   MEDICATIONS AT HOME:   Prior to Admission medications   Medication Sig Start Date End Date Taking? Authorizing Provider  amitriptyline (ELAVIL) 25 MG tablet Take 25 mg by mouth at bedtime.   Yes [provider]  Apoaequorin 10 MG CAPS Take 1 capsule by mouth daily.   Yes [provider]  aspirin 81 MG chewable tablet Chew 81 mg by mouth daily.   Yes [provider]  carvedilol (COREG) 12.5 MG tablet Take 1 tablet (12.5 mg total) by mouth 2 (two) times daily with a meal. 12/17/13  Yes Belva Crome, MD  cholecalciferol (VITAMIN D) 1000 UNITS tablet Take 2,000 Units by mouth daily.   Yes [provider]  clonazePAM (KLONOPIN) 2 MG tablet Take 2 mg by mouth at bedtime. 11/20/14  Yes [provider]  Cranberry 400 MG TABS Take 800 mg by mouth daily.   Yes [provider]  HYDROcodone-acetaminophen (NORCO) 10-325 MG tablet Take 1 tablet by mouth every 6 (six) hours as needed for moderate pain. for pain 01/09/18  Yes [provider]  losartan (COZAAR) 50 MG tablet Take 50 mg by mouth daily.  03/26/15  01/20/26 Yes [provider]  mirtazapine (REMERON) 30 MG tablet Take 30 mg by mouth at bedtime.   Yes [provider]  Multiple Vitamin (MULTI-VITAMINS) TABS Take 1 tablet by mouth daily.   Yes [provider]  nitroGLYCERIN (NITROSTAT) 0.4 MG SL tablet Place 1 tablet (0.4 mg total) under the tongue every 5 (five) minutes as needed for chest pain. 12/13/13  Yes Belva Crome, MD  pantoprazole (PROTONIX) 40 MG tablet Take 40 mg by mouth 2 (two) times daily. 07/11/17  Yes [provider]  sertraline (ZOLOFT) 25 MG tablet Take 25 mg by mouth daily. 12/06/20  Yes [provider]  sucralfate (CARAFATE) 1 g tablet Take 1 g by mouth 3 (three) times daily after meals. 01/09/20  Yes  [provider]  tamsulosin (FLOMAX) 0.4 MG CAPS capsule Take 0.4 mg by mouth daily. 01/09/20  Yes [provider]  venlafaxine XR (EFFEXOR-XR) 37.5 MG 24 hr capsule Take 37.5 mg by mouth daily. 01/09/20  Yes [provider]  vitamin B-12 (CYANOCOBALAMIN) 1000 MCG tablet Take 2,000 mcg by mouth daily.   Yes [provider]  Wheat Dextrin (BENEFIBER DRINK MIX PO) Take 5 mLs by mouth daily.   Yes [provider]      VITAL SIGNS:  Blood pressure (!) 151/79, pulse 73, temperature (!) 97.5 F (36.4 C), temperature source Oral, resp. rate 18, SpO2 97 %.  PHYSICAL EXAMINATION:  Physical Exam  GENERAL:  80 y.o.-year-old Caucasian patient lying in the bed with no acute distress.  EYES: Pupils equal, round, reactive to light and accommodation. No scleral icterus. Extraocular muscles intact.  HEENT: Head atraumatic, normocephalic. Oropharynx and nasopharynx clear.  NECK:  Supple, no jugular venous distention. No thyroid enlargement, no tenderness.  LUNGS: Diminished expiratory airflow with prolonged expiratory phase/harsh vesicular breathing and occasional rhonchi and wheezes. CARDIOVASCULAR: Regular rate and rhythm, S1, S2 normal. No murmurs,  rubs, or gallops.  ABDOMEN: Soft, nondistended, nontender. Bowel sounds present. No organomegaly or mass.  EXTREMITIES: No pedal edema, cyanosis, or clubbing.  NEUROLOGIC: Cranial nerves II through XII are intact. Muscle strength 5/5 in all extremities. Sensation intact. Gait not checked.  PSYCHIATRIC: The patient is alert and oriented x 3.  Normal affect and good eye contact. SKIN: No obvious rash, lesion, or ulcer.   LABORATORY PANEL:   CBC Recent Labs  Lab 12/11/20 1944  WBC 9.2  HGB 13.3  HCT 39.5  PLT 113*   ------------------------------------------------------------------------------------------------------------------  Chemistries  Recent Labs  Lab 12/11/20 1944  NA 138  K 4.5  CL 102  CO2 28  GLUCOSE 92  BUN 18  CREATININE 1.17  CALCIUM 9.4  AST 18  ALT 15  ALKPHOS 49  BILITOT 1.5*   ------------------------------------------------------------------------------------------------------------------  Cardiac Enzymes No results for input(s): TROPONINI in the last 168 hours. ------------------------------------------------------------------------------------------------------------------  RADIOLOGY:  CT Hip Left Wo Contrast  Result Date: 12/11/2020 CLINICAL DATA:  Fall. EXAM: CT OF THE LEFT HIP WITHOUT CONTRAST TECHNIQUE: Multidetector CT imaging of the left hip was performed according to the standard protocol. Multiplanar CT image reconstructions were also generated. COMPARISON:  Left hip x-rays from same day. FINDINGS: Bones/Joint/Cartilage No fracture or dislocation. Mild left hip degenerative changes. Osteopenia. No joint effusion. Ligaments Ligaments are suboptimally evaluated by CT. Muscles and Tendons Grossly intact. Soft tissue No fluid collection or hematoma.  No soft tissue mass. IMPRESSION: 1. No acute osseous abnormality. Electronically Signed   By: Titus Dubin M.D.   On: 12/11/2020 19:23   DG Chest Portable 1 View  Result Date:  12/11/2020 CLINICAL DATA:  COPD. EXAM: PORTABLE CHEST 1 VIEW COMPARISON:  Chest radiograph dated 01/22/2018. FINDINGS: There is diffuse chronic interstitial coarsening and bronchitic changes. No focal consolidation, pleural effusion, or pneumothorax. The cardiac silhouette is within limits. Atherosclerotic calcification of the aorta. Median sternotomy wires. No acute osseous pathology. IMPRESSION: No active disease. Electronically Signed   By: Anner Crete M.D.   On: 12/11/2020 19:10   DG Hip Unilat With Pelvis 2-3 Views Left  Result Date: 12/11/2020 CLINICAL DATA:  Fall, left hip pain EXAM: DG HIP (WITH OR WITHOUT PELVIS) 2-3V LEFT COMPARISON:  None. FINDINGS: Osteopenia. No displaced fracture or dislocation of the left hip. Mild superior joint space narrowing osteophytosis. The partially imaged pelvis  and proximal right femur are unremarkable. IMPRESSION: No displaced fracture or dislocation of the left hip. Please note that plain radiographs are significantly insensitive for hip and pelvic fracture, particularly in the setting of osteopenia. Recommend CT or MRI to more sensitively evaluate for fracture if clinically suspected. Electronically Signed   By: Eddie Candle M.D.   On: 12/11/2020 15:45      IMPRESSION AND PLAN:  Active Problems:   COPD exacerbation ([place])  1.  Mild COPD acute exacerbation like secondary to acute bronchitis. - The patient will be admitted to a medical monitored observation bed. - We will continue nebulized bronchodilator therapy with DuoNebs 4 times daily and every 4 hours as needed. - Mucolytic therapy will be provided. - Steroid therapy will be provided with IV Solu-Medrol. - We will place him on IV Rocephin.  2.  Fall with subsequent left hip pain and recent generalized weakness. - Physical therapy consult to be obtained. - The patient has no evidence for hip fracture on x-ray or CT scan of the left hip.  3.  Essential hypertension. - We will continue Coreg  and Cozaar.  4.  Anxiety and depression. - We will continue Klonopin and, Zoloft and Effexor XR.  5.  BPH. - We will continue Flomax.  6.  Vitamin B12 deficiency. - We will continue vitamin B12.  7.  GERD. - We will continue Carafate.  DVT prophylaxis: Lovenox.  Code Status: full code.  Family Communication:  The plan of care was discussed in details with the patient (and family). I answered all questions. The patient agreed to proceed with the above mentioned plan. Further management will depend upon hospital course. Disposition Plan: Back to previous home environment Consults called: none.  All the records are reviewed and case discussed with ED provider.  Status is: Observation  The patient remains OBS appropriate and will d/c before 2 midnights.  Dispo: The patient is from: Home              Anticipated d/c is to: Home              Patient currently is not medically stable to d/c.   Difficult to place patient No   TOTAL TIME TAKING CARE OF THIS PATIENT: 55 minutes.    Christel Mormon M.D on 12/11/2020 at 9:25 PM  [facility]   From 7 PM-7 AM, contact night-coverage [email or web address]  CC: Primary care physician; Idelle Crouch, MD

## 2020-12-12 DIAGNOSIS — J441 Chronic obstructive pulmonary disease with (acute) exacerbation: Secondary | ICD-10-CM | POA: Diagnosis not present

## 2020-12-12 LAB — BASIC METABOLIC PANEL
Anion gap: 12 (ref 5–15)
BUN: 23 mg/dL (ref 8–23)
CO2: 25 mmol/L (ref 22–32)
Calcium: 9.1 mg/dL (ref 8.9–10.3)
Chloride: 101 mmol/L (ref 98–111)
Creatinine, Ser: 1.12 mg/dL (ref 0.61–1.24)
GFR, Estimated: >60 mL/min (ref >60)
Glucose, Bld: 126 mg/dL — ABNORMAL HIGH (ref 70–99)
Potassium: 4.2 mmol/L (ref 3.5–5.1)
Sodium: 138 mmol/L (ref 135–145)

## 2020-12-12 LAB — CBC
HCT: 38.1 % — ABNORMAL LOW (ref 39.0–52.0)
Hemoglobin: 13.2 g/dL (ref 13.0–17.0)
MCH: 35.2 pg — ABNORMAL HIGH (ref 26.0–34.0)
MCHC: 34.6 g/dL (ref 30.0–36.0)
MCV: 101.6 fL — ABNORMAL HIGH (ref 80.0–100.0)
Platelets: 106 10*3/uL — ABNORMAL LOW (ref 150–400)
RBC: 3.75 MIL/uL — ABNORMAL LOW (ref 4.22–5.81)
RDW: 12.7 % (ref 11.5–15.5)
WBC: 7.6 10*3/uL (ref 4.0–10.5)
nRBC: 0 % (ref 0.0–0.2)

## 2020-12-12 MED ORDER — IPRATROPIUM-ALBUTEROL 0.5-2.5 (3) MG/3ML IN SOLN: 3.0000 mL | Freq: Four times a day (QID) | RESPIRATORY_TRACT | Status: DC | PRN

## 2020-12-12 NOTE — Progress Notes (Signed)
PROGRESS NOTE    Douglas Clark  FTD:[id number] DOB: 16-Apr-1940 DOA: 12/11/2020 PCP: Idelle Crouch, MD     Brief Narrative:  Douglas Clark is a 80 y.o. Caucasian male with medical history significant for coronary artery disease, GERD, emphysema, dementia, and anxiety, osteoarthritis, hypertension, [name], who presented to the emergency room with acute onset of fall out of bed earlier today and increasing weakness lately.  The patient was at [facility] for 1 week last week to give his wife a break as it has been difficult for her to care for him at home.  He has been home a couple of days ago.  It has been difficult for her to get him around as he has been reliant on his wheelchair and others for ADLs.  He stated that when he fell today and hit his head but denies any presyncope or syncope.  No paresthesias or focal muscle weakness.  He was complaining of left hip pain after his fall.  He has been having increasing cough productive of greenish sputum lately with associated dyspnea with occasional wheezing. Chest x-ray showed no acute cardiopulmonary disease.  Left hip x-ray showed no acute abnormalities.  Left hip CT showed no osseous abnormality.   New events last 24 hours / Subjective: Patient complains of generalized weakness.  Has not been independently ambulating for several months, usually gets around the house by holding onto furniture.  Also admits to productive cough of green/gray sputum  Assessment & Plan:   Active Problems:   COPD exacerbation (HCC)  Acute bronchitis with COPD exacerbation  -De-escalate steroids to p.o. -Rocephin -Breathing treatments  Recurrent falls and generalized weakness -PT OT  Hypertension -Continue Coreg, Cozaar  Anxiety/depression -Continue Klonopin, Zoloft, Effexor  BPH -Continue Flomax  DVT prophylaxis:  enoxaparin (LOVENOX) injection 40 mg Start: 12/12/20 0800  Code Status:     Code Status  Orders  (From admission, onward)           Start     Ordered   12/11/20 2124  Full code  Continuous        12/11/20 2124           Code Status History     Date Active Date Inactive Code Status Order ID Comments User Context   01/21/2018 1937 01/23/2018 1838 Full Code [id number]  Hillary Bow, MD ED   02/04/2015 1715 02/10/2015 1902 Full Code [id number]  Leeroy Cha, MD Inpatient      Family Communication: No family at bedside Disposition Plan:  Status is: Observation  The patient will require care spanning > 2 midnights and should be moved to inpatient because: Unsafe d/c plan  Dispo: The patient is from: Home              Anticipated d/c is to: SNF              Patient currently is not medically stable to d/c.   Difficult to place patient No   Antimicrobials:  Anti-infectives (From admission, onward)    Start     Dose/Rate Route Frequency Ordered Stop   12/11/20 2200  cefTRIAXone (ROCEPHIN) 1 g in sodium chloride 0.9 % 100 mL IVPB        1 g 200 mL/hr over 30 Minutes Intravenous Every 24 hours 12/11/20 2124 12/16/20 2159        Objective: Vitals:   12/11/20 2230 12/11/20 2343 12/12/20 0426 12/12/20 0811  BP: 138/79 128/74 (!) 163/72 (!) 164/91  Pulse:  81 (!) 58 80  Resp:  20 20   Temp:  97.8 F (36.6 C) (!) 97.4 F (36.3 C) 97.7 F (36.5 C)  TempSrc:  Axillary Oral   SpO2:  97% 100% 100%  Weight:  54 kg    Height:  5\' 10"  (1.778 m)      Intake/Output Summary (Last 24 hours) at 12/12/2020 1149 Last data filed at 12/12/2020 0900 Gross per 24 hour  Intake 120 ml  Output 200 ml  Net -80 ml   Filed Weights   12/11/20 2343  Weight: 54 kg    Examination:  General exam: Appears calm and comfortable, frail appearing Respiratory system: Diminished breath sounds bilaterally without wheezing, no conversational dyspnea or respiratory distress, on room air Cardiovascular system: S1 & S2 heard, RRR. No murmurs. No pedal edema. Gastrointestinal  system: Abdomen is nondistended, soft and nontender. Normal bowel sounds heard. Central nervous system: Alert and oriented. No focal neurological deficits. Speech clear.  Extremities: Symmetric in appearance  Skin: No rashes, lesions or ulcers on exposed skin  Psychiatry: Judgement and insight appear normal. Mood & affect appropriate.   Data Reviewed: I have personally reviewed following labs and imaging studies  CBC: Recent Labs  Lab 12/11/20 1944 12/12/20 0510  WBC 9.2 7.6  NEUTROABS 7.1  --   HGB 13.3 13.2  HCT 39.5 38.1*  MCV 101.8* 101.6*  PLT 113* 161*   Basic Metabolic Panel: Recent Labs  Lab 12/11/20 1944 12/12/20 0510  NA 138 138  K 4.5 4.2  CL 102 101  CO2 28 25  GLUCOSE 92 126*  BUN 18 23  CREATININE 1.17 1.12  CALCIUM 9.4 9.1   GFR: Estimated Creatinine Clearance: 41.5 mL/min (by C-G formula based on SCr of 1.12 mg/dL). Liver Function Tests: Recent Labs  Lab 12/11/20 1944  AST 18  ALT 15  ALKPHOS 49  BILITOT 1.5*  PROT 7.0  ALBUMIN 3.7   No results for input(s): LIPASE, AMYLASE in the last 168 hours. No results for input(s): AMMONIA in the last 168 hours. Coagulation Profile: No results for input(s): INR, PROTIME in the last 168 hours. Cardiac Enzymes: No results for input(s): CKTOTAL, CKMB, CKMBINDEX, TROPONINI in the last 168 hours. BNP (last 3 results) No results for input(s): PROBNP in the last 8760 hours. HbA1C: No results for input(s): HGBA1C in the last 72 hours. CBG: No results for input(s): GLUCAP in the last 168 hours. Lipid Profile: No results for input(s): CHOL, HDL, LDLCALC, TRIG, CHOLHDL, LDLDIRECT in the last 72 hours. Thyroid Function Tests: No results for input(s): TSH, T4TOTAL, FREET4, T3FREE, THYROIDAB in the last 72 hours. Anemia Panel: No results for input(s): VITAMINB12, FOLATE, FERRITIN, TIBC, IRON, RETICCTPCT in the last 72 hours. Sepsis Labs: No results for input(s): PROCALCITON, LATICACIDVEN in the last 168  hours.  Recent Results (from the past 240 hour(s))  Resp Panel by RT-PCR (Flu A&B, Covid) Nasopharyngeal Swab     Status: None   Collection Time: 12/11/20  7:44 PM   Specimen: Nasopharyngeal Swab; Nasopharyngeal(NP) swabs in vial transport medium  Result Value Ref Range Status   SARS Coronavirus 2 by RT PCR NEGATIVE NEGATIVE Final    Comment: (NOTE) SARS-CoV-2 target nucleic acids are NOT DETECTED.  The SARS-CoV-2 RNA is generally detectable in upper respiratory specimens during the acute phase of infection. The lowest concentration of SARS-CoV-2 viral copies this assay can detect is 138 copies/mL. A negative result does not preclude SARS-Cov-2 infection and should not be used  as the sole basis for treatment or other patient management decisions. A negative result may occur with  improper specimen collection/handling, submission of specimen other than nasopharyngeal swab, presence of viral mutation(s) within the areas targeted by this assay, and inadequate number of viral copies(<138 copies/mL). A negative result must be combined with clinical observations, patient history, and epidemiological information. The expected result is Negative.  Fact Sheet for Patients:  EntrepreneurPulse.com.au  Fact Sheet for Healthcare Providers:  IncredibleEmployment.be  This test is no t yet approved or cleared by the [place] FDA and  has been authorized for detection and/or diagnosis of SARS-CoV-2 by FDA under an Emergency Use Authorization (EUA). This EUA will remain  in effect (meaning this test can be used) for the duration of the COVID-19 declaration under Section 564(b)(1) of the Act, 21 U.S.C.section 360bbb-3(b)(1), unless the authorization is terminated  or revoked sooner.       Influenza A by PCR NEGATIVE NEGATIVE Final   Influenza B by PCR NEGATIVE NEGATIVE Final    Comment: (NOTE) The Xpert Xpress SARS-CoV-2/FLU/RSV plus assay is intended  as an aid in the diagnosis of influenza from Nasopharyngeal swab specimens and should not be used as a sole basis for treatment. Nasal washings and aspirates are unacceptable for Xpert Xpress SARS-CoV-2/FLU/RSV testing.  Fact Sheet for Patients: EntrepreneurPulse.com.au  Fact Sheet for Healthcare Providers: IncredibleEmployment.be  This test is not yet approved or cleared by the [place] FDA and has been authorized for detection and/or diagnosis of SARS-CoV-2 by FDA under an Emergency Use Authorization (EUA). This EUA will remain in effect (meaning this test can be used) for the duration of the COVID-19 declaration under Section 564(b)(1) of the Act, 21 U.S.C. section 360bbb-3(b)(1), unless the authorization is terminated or revoked.  Performed at [facility], [address] [place], [place] [address]   Culture, blood (Routine X 2) w Reflex to ID Panel     Status: None (Preliminary result)   Collection Time: 12/11/20 10:00 PM   Specimen: BLOOD  Result Value Ref Range Status   Specimen Description BLOOD LEFT FOREARM  Final   Special Requests   Final    BOTTLES DRAWN AEROBIC AND ANAEROBIC Blood Culture adequate volume   Culture   Final    NO GROWTH < 12 HOURS Performed at [facility], [address] [place], [place] [address]    Report Status PENDING  Incomplete  Culture, blood (Routine X 2) w Reflex to ID Panel     Status: None (Preliminary result)   Collection Time: 12/11/20 11:34 PM   Specimen: BLOOD  Result Value Ref Range Status   Specimen Description BLOOD RIGHT ASSIST CONTROL  Final   Special Requests   Final    BOTTLES DRAWN AEROBIC AND ANAEROBIC Blood Culture results may not be optimal due to an excessive volume of blood received in culture bottles   Culture   Final    NO GROWTH < 12 HOURS Performed at [facility], [address], [place], [place] [address]    Report Status PENDING   Incomplete      Radiology Studies: CT Hip Left Wo Contrast  Result Date: 12/11/2020 CLINICAL DATA:  Fall. EXAM: CT OF THE LEFT HIP WITHOUT CONTRAST TECHNIQUE: Multidetector CT imaging of the left hip was performed according to the standard protocol. Multiplanar CT image reconstructions were also generated. COMPARISON:  Left hip x-rays from same day. FINDINGS: Bones/Joint/Cartilage No fracture or dislocation. Mild left hip degenerative changes. Osteopenia. No  joint effusion. Ligaments Ligaments are suboptimally evaluated by CT. Muscles and Tendons Grossly intact. Soft tissue No fluid collection or hematoma.  No soft tissue mass. IMPRESSION: 1. No acute osseous abnormality. Electronically Signed   By: Titus Dubin M.D.   On: 12/11/2020 19:23   DG Chest Portable 1 View  Result Date: 12/11/2020 CLINICAL DATA:  COPD. EXAM: PORTABLE CHEST 1 VIEW COMPARISON:  Chest radiograph dated 01/22/2018. FINDINGS: There is diffuse chronic interstitial coarsening and bronchitic changes. No focal consolidation, pleural effusion, or pneumothorax. The cardiac silhouette is within limits. Atherosclerotic calcification of the aorta. Median sternotomy wires. No acute osseous pathology. IMPRESSION: No active disease. Electronically Signed   By: Anner Crete M.D.   On: 12/11/2020 19:10   DG Hip Unilat With Pelvis 2-3 Views Left  Result Date: 12/11/2020 CLINICAL DATA:  Fall, left hip pain EXAM: DG HIP (WITH OR WITHOUT PELVIS) 2-3V LEFT COMPARISON:  None. FINDINGS: Osteopenia. No displaced fracture or dislocation of the left hip. Mild superior joint space narrowing osteophytosis. The partially imaged pelvis and proximal right femur are unremarkable. IMPRESSION: No displaced fracture or dislocation of the left hip. Please note that plain radiographs are significantly insensitive for hip and pelvic fracture, particularly in the setting of osteopenia. Recommend CT or MRI to more sensitively evaluate for fracture if clinically  suspected. Electronically Signed   By: Eddie Candle M.D.   On: 12/11/2020 15:45      Scheduled Meds:  aspirin  81 mg Oral Daily   carvedilol  12.5 mg Oral BID WC   cholecalciferol  2,000 Units Oral Daily   clonazePAM  2 mg Oral QHS   enoxaparin (LOVENOX) injection  40 mg Subcutaneous Q24H   fiber   Oral Daily   guaiFENesin  600 mg Oral BID   losartan  50 mg Oral Daily   mirtazapine  30 mg Oral QHS   multivitamin with minerals  1 tablet Oral Daily   pantoprazole  40 mg Oral BID   [START ON 12/13/2020] predniSONE  40 mg Oral Q breakfast   sertraline  25 mg Oral Daily   sucralfate  1 g Oral TID PC   tamsulosin  0.4 mg Oral Daily   venlafaxine XR  37.5 mg Oral Daily   vitamin B-12  2,000 mcg Oral Daily   Continuous Infusions:  sodium chloride 100 mL/hr at 12/12/20 0052   cefTRIAXone (ROCEPHIN)  IV Stopped (12/12/20 0041)     LOS: 0 days      Time spent: 25 minutes   Dessa Phi, DO [facility] 12/12/2020, 11:49 AM   Available via Epic secure chat 7am-7pm After these hours, please refer to coverage provider listed on [email or web address]

## 2020-12-12 NOTE — Progress Notes (Signed)
Patient arrived to unit awake and alert. Pt. Transferred from ED bed to unit bed. Patient alert and oriented, on room air. IV intact with NS running gravity.

## 2020-12-12 NOTE — Evaluation (Signed)
Occupational Therapy Evaluation Patient Details Name: Douglas Clark MRN: [id number] DOB: December 22, 1940 Today's Date: 12/12/2020   History of Present Illness presented to ER secondary to fall from bed in home environment; admitted for management of COPD exacerbation.  L hip imaging negative for acute injury.   Clinical Impression   Patient presenting with decreased Ind in self care, balance, functional mobility/transfers, endurance, and safety awareness. Patient reports being wheelchair level and wife assists him with stand/squat pivot transfers into wheelchair and onto commode. Pt uses disposable briefs as needed. He has an aide that assists with ADLs PTA. Patient currently functioning at min - mod A overall for transfers and self care tasks. Patient will benefit from acute OT to increase overall independence in the areas of ADLs, functional mobility, and safety awareness in order to safely discharge to next venue of care.       Recommendations for follow up therapy are one component of a multi-disciplinary discharge planning process, led by the attending physician.  Recommendations may be updated based on patient status, additional functional criteria and insurance authorization.   Follow Up Recommendations  SNF;Supervision/Assistance - 24 hour    Equipment Recommendations  Other (comment) (defer to next venue of care)       Precautions / Restrictions Precautions Precautions: Fall Restrictions Weight Bearing Restrictions: No      Mobility Bed Mobility Overal bed mobility: Needs Assistance Bed Mobility: Supine to Sit;Sit to Supine     Supine to sit: Min assist Sit to supine: Min assist   General bed mobility comments: min A for trunk support with bed mobility and min cuing for technique    Transfers Overall transfer level: Needs assistance Equipment used: None Transfers: Sit to/from [facility] Sit to Stand: Mod assist Stand pivot transfers: Mod assist        General transfer comment: constant manual faciliation at trunk (for postural extension) and hips (for anterior weight translation and overall weight shift).  Intermittent blocking/facilitation of bilat knee extension required.  With ant/lateral weight shift from therapist, able to take unweight/advance LEs during transfer (though unable to initiate indep without weight shifting from therapist)    Balance Overall balance assessment: Needs assistance Sitting-balance support: No upper extremity supported;Feet supported Sitting balance-Leahy Scale: Fair     Standing balance support: Bilateral upper extremity supported Standing balance-Leahy Scale: Poor                             ADL either performed or assessed with clinical judgement   ADL Overall ADL's : Needs assistance/impaired     Grooming: Wash/dry hands;Wash/dry face;Sitting;Set up;Supervision/safety                                       Vision Patient Visual Report: No change from baseline              Pertinent Vitals/Pain Pain Assessment: Faces Faces Pain Scale: Hurts little more Pain Location: L hip Pain Descriptors / Indicators: Sore Pain Intervention(s): Limited activity within patient's tolerance;Monitored during session;Repositioned     Hand Dominance Right   Extremity/Trunk Assessment Upper Extremity Assessment Upper Extremity Assessment: Generalized weakness   Lower Extremity Assessment Lower Extremity Assessment: Generalized weakness       Communication Communication Communication: No difficulties   Cognition Arousal/Alertness: Awake/alert Behavior During Therapy: WFL for tasks assessed/performed Overall Cognitive Status: History  of cognitive impairments - at baseline                                 General Comments: Pt follows simple commands with increased time. He is pleasant and cooperative throughout. A & O x4.      Exercises Other  Exercises Other Exercises: Sit/stand x2 with mod/max assist, L HHA from therapist.  Poor balance, all planes; constant assist bilat knees, hips and trunk for postural extension Other Exercises: Bed/chair transfer, SPT, mod assist to both R and L Other Exercises: Family training with wife, emphasis on technique, hand placement, positioning and body mechanics with sit/stand and SPT (bed/chair).  Encouraged in cuing "place your chin on my shoulder", "lean forward" to facilitate anterior weight translation with transfers.  Wife with difficulty getting self low enough with transfer, and tends to place bilat LEs under arms/on scapulae versus mid-trunk and hips; resulting in less physical support and stability to patient.  Ultimately able to complete transfer bilaterally, but requiring hands-on assist for both patient and wife to stabilize during transfer. Other Exercises: Initiated trial and education in scoot pivot transfers; patient able to complete scoot pivot (level surfaces, towards R) with min assist from therapist.  May benefit from additional training in thie technique to optimize patient indep and decreased burden of care moving forward. Would require additional equipment in the home to consistently utilize this technique (manual WC with removeable arm rests, DABSC) Other Exercises: Did briefly discuss option of hoyer lift if patient/wife felt strongly in discharge home; wife voices perception that lift "wouldn't work in our house"   Shoulder [place] expects to be discharged to:: Private residence Living Arrangements: Spouse/significant other Available Help at Discharge: Family;Available PRN/intermittently Type of Home: House Home Access: Other (comment) (no stairs from garage enterance)     Home Layout: One level     Bathroom Shower/Tub: Occupational psychologist: Handicapped height Bathroom Accessibility: Yes   Home Equipment: Environmental consultant - 2  wheels;Walker - 4 wheels;Wheelchair - manual   Additional Comments: Has HH RN once/week, aide twice/week      Prior Functioning/Environment Level of Independence: Needs assistance        Comments: Patient/wife endorse WC level as primary mobility for past three years, stand/squat pivot between surfaces with 75% assist from wife.  Patient largely incontinent of bladder at baseline (wears depends), transferring to toilet only for BM.  Baseline fine motor deficits limited ability to complete tasks indep.  Endorses multiple fall history and reports recent stay at [facility] (x1 week) for respite (no therapy received during stay).        OT Problem List: Decreased strength;Decreased coordination;Decreased activity tolerance;Decreased safety awareness;Impaired balance (sitting and/or standing);Decreased knowledge of use of DME or AE;Pain      OT Treatment/Interventions: Self-care/ADL training;Therapeutic exercise;Therapeutic activities;Cognitive remediation/compensation;Energy conservation;DME and/or AE instruction;Patient/family education;Balance training;Manual therapy    OT Goals(Current goals can be found in the care plan section) Acute Rehab OT Goals Patient Stated Goal: to go home if possible OT Goal Formulation: With patient Time For Goal Achievement: 12/26/20 Potential to Achieve Goals: Good ADL Goals Pt Will Perform Grooming: sitting;with supervision Pt Will Transfer to Toilet: with min assist;bedside commode;stand pivot transfer Pt/caregiver will Perform Home Exercise Program: Increased strength;Both right and left upper extremity;With theraband;With written HEP provided;With Supervision  OT Frequency: Min 2X/week   Barriers to D/C: Decreased caregiver  support  wife unable to provide this level of assistance       Co-evaluation              AM-PAC OT "6 Clicks" Daily Activity     Outcome Measure Help from another person eating meals?: None Help from another person taking  care of personal grooming?: A Little Help from another person toileting, which includes using toliet, bedpan, or urinal?: A Lot Help from another person bathing (including washing, rinsing, drying)?: A Lot Help from another person to put on and taking off regular upper body clothing?: A Little Help from another person to put on and taking off regular lower body clothing?: A Lot 6 Click Score: 16   End of Session Nurse Communication: Mobility status  Activity Tolerance: Patient tolerated treatment well Patient left: in bed;with call bell/phone within reach;with bed alarm set  OT Visit Diagnosis: Unsteadiness on feet (R26.81);Muscle weakness (generalized) (M62.81);History of falling (Z91.81);Repeated falls (R29.6)                Time: 8206-0156 OT Time Calculation (min): 27 min Charges:  OT General Charges $OT Visit: 1 Visit OT Evaluation $OT Eval Moderate Complexity: 1 Mod OT Treatments $Self Care/Home Management : 8-22 mins Darleen Crocker, MS, OTR/L , CBIS ascom [phone]  12/12/20, 4:30 PM

## 2020-12-12 NOTE — Plan of Care (Signed)

## 2020-12-12 NOTE — Evaluation (Signed)
Physical Therapy Evaluation Patient Details Name: Douglas Clark MRN: [id number] DOB: 01-27-1941 Today's Date: 12/12/2020  History of Present Illness  presented to ER secondary to fall from bed in home environment; admitted for management of COPD exacerbation.  L hip imaging negative for acute injury.  Clinical Impression  Patient resting in bed upon arrival to room; wife at bedside, pleasant and supportive.  Alert and oriented to self, location; follows simple commands. Notable difficulty with recall of information and integration/retention of new information.  Endorses mild soreness to L hip, but no impact/barrier to [facility] or transfers during session.  Bilat UE/LE strength and ROM grossly symmetrical and WFL for basic transfers; however, generally weak and deconditioned throughout. No significant tremors noted during session; however, wife does endorse significant baseline tremors (R > L) that limit ability to participate with fine-motor and self-care tasks.  Currently requiring mod assist for bed mobility; mod assist for sit/stand and SPT without assist device.  Constant manual facilitation for weight shifting, anterior weight translation and postural extension with all transfer attempts.  High risk for LOB, LE buckling as patient fatigue-levels increase.   Did initiate hands-on training with patient and wife for basic transfers.  Therapist demonstrated technique, verbally reviewed information and allowed return demonstration from wife.  Wife Ultimately able to complete transfer with patient bilaterally, but requiring hands-on assist for both patient and wife to stabilize during transfer. Additionally, did trial scoot pivot transfers over level surfaces.  Patient able to complete with min assist, step by step cuing from therapist.  May benefit from additional training in this technique to optimize patient indep and decreased burden of care moving forward.  Would benefit from skilled PT to address  above deficits and promote optimal return to PLOF.; recommend transition to STR upon discharge from acute hospitalization.      Recommendations for follow up therapy are one component of a multi-disciplinary discharge planning process, led by the attending physician.  Recommendations may be updated based on patient status, additional functional criteria and insurance authorization.  Follow Up Recommendations SNF (may benefit from consideration of LTC if wife unable to manage care upon completion of rehab course)    Equipment Recommendations       Recommendations for Other Services       Precautions / Restrictions Precautions Precautions: Fall Restrictions Weight Bearing Restrictions: No      Mobility  Bed Mobility Overal bed mobility: Needs Assistance Bed Mobility: Supine to Sit     Supine to sit: Mod assist     General bed mobility comments: assist for truncal elevation; transition performed towards L side of bed    Transfers Overall transfer level: Needs assistance Equipment used: None Transfers: Sit to/from [employer] Transfers Sit to Stand: Mod assist Stand pivot transfers: Mod assist       General transfer comment: constant manual faciliation at trunk (for postural extension) and hips (for anterior weight translation and overall weight shift).  Intermittent blocking/facilitation of bilat knee extension required.  With ant/lateral weight shift from therapist, able to take unweight/advance LEs during transfer (though unable to initiate indep without weight shifting from therapist)  Ambulation/Gait             General Gait Details: non-ambulatory at baseline  Stairs            Wheelchair Mobility    Modified Rankin (Stroke Patients Only)       Balance Overall balance assessment: Needs assistance Sitting-balance support: No upper extremity supported;Feet supported Sitting  balance-Leahy Scale: Fair     Standing balance support:  Bilateral upper extremity supported Standing balance-Leahy Scale: Poor                               Pertinent Vitals/Pain Pain Assessment: Faces Faces Pain Scale: Hurts little more Pain Location: L hip Pain Descriptors / Indicators: Sore Pain Intervention(s): Limited activity within patient's tolerance;Monitored during session;Repositioned    Home Living Family/patient expects to be discharged to:: Private residence Living Arrangements: Spouse/significant other Available Help at Discharge: Family;Available PRN/intermittently Type of Home: House Home Access:  (no stairs required through garage entrance)     Home Layout: One level Home Equipment: Walker - 2 wheels;Walker - 4 wheels;Wheelchair - manual Additional Comments: Has HH RN once/week, aide twice/week    Prior Function Level of Independence: Needs assistance         Comments: Patient/wife endorse WC level as primary mobility for past three years, stand/squat pivot between surfaces with 75% assist from wife.  Patient largely incontinent of bladder at baseline (wears depends), transferring to toilet only for BM.  Baseline fine motor deficits limited ability to complete tasks indep.  Endorses multiple fall history and reports recent stay at [facility] (x1 week) for respite (no therapy received during stay).     Hand Dominance   Dominant Hand: Right    Extremity/Trunk Assessment   Upper Extremity Assessment Upper Extremity Assessment: Generalized weakness (grossly 4-/5 throughout)    Lower Extremity Assessment Lower Extremity Assessment: Generalized weakness (grossly 4-/5 throughout)       Communication   Communication: No difficulties  Cognition Arousal/Alertness: Awake/alert Behavior During Therapy: WFL for tasks assessed/performed Overall Cognitive Status: History of cognitive impairments - at baseline                                 General Comments: oriented to self, location as  hospital; follows simple commands, but requires frequent redirection due to limited attention to/recall of request; frequently looks to/defers to wife for answers      General Comments      Exercises Other Exercises Other Exercises: Sit/stand x2 with mod/max assist, L HHA from therapist.  Poor balance, all planes; constant assist bilat knees, hips and trunk for postural extension Other Exercises: Bed/chair transfer, SPT, mod assist to both R and L Other Exercises: Family training with wife, emphasis on technique, hand placement, positioning and body mechanics with sit/stand and SPT (bed/chair).  Encouraged in cuing "place your chin on my shoulder", "lean forward" to facilitate anterior weight translation with transfers.  Wife with difficulty getting self low enough with transfer, and tends to place bilat LEs under arms/on scapulae versus mid-trunk and hips; resulting in less physical support and stability to patient.  Ultimately able to complete transfer bilaterally, but requiring hands-on assist for both patient and wife to stabilize during transfer. Other Exercises: Initiated trial and education in scoot pivot transfers; patient able to complete scoot pivot (level surfaces, towards R) with min assist from therapist.  May benefit from additional training in thie technique to optimize patient indep and decreased burden of care moving forward. Would require additional equipment in the home to consistently utilize this technique (manual WC with removeable arm rests, DABSC) Other Exercises: Did briefly discuss option of hoyer lift if patient/wife felt strongly in discharge home; wife voices perception that lift "wouldn't work in our house"  Assessment/Plan    PT Assessment Patient needs continued PT services  PT Problem List Decreased strength;Decreased activity tolerance;Decreased balance;Decreased mobility;Decreased coordination;Decreased cognition;Decreased knowledge of use of DME;Decreased safety  awareness;Decreased knowledge of precautions       PT Treatment Interventions DME instruction;Functional mobility training;Therapeutic activities;Therapeutic exercise;Balance training;Patient/family education;Cognitive remediation    PT Goals (Current goals can be found in the Care Plan section)  Acute Rehab PT Goals Patient Stated Goal: to go home if possible PT Goal Formulation: With patient/family Time For Goal Achievement: 12/26/20 Potential to Achieve Goals: Fair    Frequency Min 2X/week   Barriers to discharge        Co-evaluation               AM-PAC PT "6 Clicks" Mobility  Outcome Measure Help needed turning from your back to your side while in a flat bed without using bedrails?: A Little Help needed moving from lying on your back to sitting on the side of a flat bed without using bedrails?: A Lot Help needed moving to and from a bed to a chair (including a wheelchair)?: A Lot Help needed standing up from a chair using your arms (e.g., wheelchair or bedside chair)?: A Lot Help needed to walk in hospital room?: Total Help needed climbing 3-5 steps with a railing? : Total 6 Click Score: 11    End of Session Equipment Utilized During Treatment: Gait belt Activity Tolerance: Patient tolerated treatment well Patient left: in chair;with call bell/phone within reach;with chair alarm set Nurse Communication: Mobility status PT Visit Diagnosis: Muscle weakness (generalized) (M62.81);Repeated falls (R29.6)    Time: 6811-5726 PT Time Calculation (min) (ACUTE ONLY): 56 min   Charges:   PT Evaluation $PT Eval Moderate Complexity: 1 Mod PT Treatments $Therapeutic Activity: 38-52 mins        Zuma Hust H. Owens Shark, PT, DPT, NCS 12/12/20, 3:06 PM [phone]

## 2020-12-12 NOTE — Care Management Obs Status (Signed)
[place] NOTIFICATION   Patient Details  Name: GAIGE SEBO MRN: [id number] Date of Birth: 1940-08-21   Medicare Observation Status Notification Given:  Yes    Beverly Sessions, RN 12/12/2020, 3:14 PM

## 2020-12-12 NOTE — NC FL2 (Signed)
[place] LEVEL OF CARE SCREENING TOOL     IDENTIFICATION  Patient Name: Douglas Clark Birthdate: Apr 26, 1940 Sex: male Admission Date (Current Location): 12/11/2020  [facility] and [place] Number:  Engineering geologist and Address:         Provider Number: [id number]  Attending Physician Name and Address:  Dessa Phi, DO  Relative Name and Phone Number:       Current Level of Care: Hospital Recommended Level of Care: [place] Prior Approval Number:    Date Approved/Denied:   PASRR Number: [id number]  Discharge Plan: SNF    Current Diagnoses: Patient Active Problem List   Diagnosis Date Noted   COPD exacerbation ([place]) 12/11/2020   Prostate cancer ([place]) 05/09/2018   Pneumonia 01/21/2018   Parkinsonism ([place]) 07/15/2015   CTS (carpal tunnel syndrome) 07/10/2015   Neck pain 06/24/2015   Memory loss 06/11/2015   Abnormality of gait 06/11/2015   Lumbar stenosis with neurogenic claudication 02/04/2015   Bifascicular block 12/13/2013   CAD (coronary artery disease) of artery bypass graft 12/13/2013   Essential hypertension 12/13/2013   Anxiety and depression 12/13/2013   Tobacco use 12/13/2013    Orientation RESPIRATION BLADDER Height & Weight     Self, Time, Situation, Place  Normal Incontinent, External catheter Weight: 54 kg Height:  5\' 10"  (177.8 cm)  BEHAVIORAL SYMPTOMS/MOOD NEUROLOGICAL BOWEL NUTRITION STATUS      Continent Diet (Regular)  AMBULATORY STATUS COMMUNICATION OF NEEDS Skin   Extensive Assist Verbally Normal                       Personal Care Assistance Level of Assistance              Functional Limitations Info             SPECIAL CARE FACTORS FREQUENCY  PT (By licensed PT), OT (By licensed OT)                    Contractures Contractures Info: Not present    Additional Factors Info  Code Status, Allergies Code Status Info: Full Allergies Info: Donepezil, Doxazosin, Trazodone  And Nefazodone, Hctz           Current Medications (12/12/2020):  This is the current hospital active medication list Current Facility-Administered Medications  Medication Dose Route Frequency Provider Last Rate Last Admin   acetaminophen (TYLENOL) tablet 650 mg  650 mg Oral Q6H PRN Mansy, Jan A, MD       Or   acetaminophen (TYLENOL) suppository 650 mg  650 mg Rectal Q6H PRN Mansy, Jan A, MD       aspirin chewable tablet 81 mg  81 mg Oral Daily Mansy, Jan A, MD   81 mg at 12/12/20 1103   carvedilol (COREG) tablet 12.5 mg  12.5 mg Oral BID WC Mansy, Jan A, MD   12.5 mg at 12/12/20 1102   cefTRIAXone (ROCEPHIN) 1 g in sodium chloride 0.9 % 100 mL IVPB  1 g Intravenous Q24H Mansy, Jan A, MD   Stopped at 12/12/20 0041   chlorpheniramine-HYDROcodone (TUSSIONEX) 10-8 MG/5ML suspension 5 mL  5 mL Oral Q12H PRN Mansy, Jan A, MD       cholecalciferol (VITAMIN D3) tablet 2,000 Units  2,000 Units Oral Daily Mansy, Jan A, MD   2,000 Units at 12/12/20 1102   clonazePAM (KLONOPIN) tablet 2 mg  2 mg Oral QHS Mansy, Jan A, MD   2 mg at  12/11/20 2248   enoxaparin (LOVENOX) injection 40 mg  40 mg Subcutaneous Q24H Mansy, Jan A, MD   40 mg at 12/12/20 1106   fiber (NUTRISOURCE FIBER)   Oral Daily Mansy, Jan A, MD   1 packet at 12/12/20 1101   guaiFENesin (MUCINEX) 12 hr tablet 600 mg  600 mg Oral BID Mansy, Jan A, MD   600 mg at 12/12/20 1102   HYDROcodone-acetaminophen (NORCO) 10-325 MG per tablet 1 tablet  1 tablet Oral Q6H PRN Mansy, Jan A, MD       ipratropium-albuterol (DUONEB) 0.5-2.5 (3) MG/3ML nebulizer solution 3 mL  3 mL Nebulization Q6H PRN Dessa Phi, DO       losartan (COZAAR) tablet 50 mg  50 mg Oral Daily Mansy, Jan A, MD   50 mg at 12/12/20 1103   magnesium hydroxide (MILK OF MAGNESIA) suspension 30 mL  30 mL Oral Daily PRN Mansy, Jan A, MD       mirtazapine (REMERON) tablet 30 mg  30 mg Oral QHS Mansy, Jan A, MD   30 mg at 12/11/20 2247   multivitamin with minerals tablet 1 tablet  1  tablet Oral Daily Mansy, Jan A, MD   1 tablet at 12/12/20 1106   nitroGLYCERIN (NITROSTAT) SL tablet 0.4 mg  0.4 mg Sublingual Q5 min PRN Mansy, Jan A, MD       ondansetron [facility]) tablet 4 mg  4 mg Oral Q6H PRN Mansy, Jan A, MD       Or   ondansetron [facility]) injection 4 mg  4 mg Intravenous Q6H PRN Mansy, Jan A, MD       pantoprazole (PROTONIX) EC tablet 40 mg  40 mg Oral BID Mansy, Jan A, MD   40 mg at 12/12/20 1102   [START ON 12/13/2020] predniSONE (DELTASONE) tablet 40 mg  40 mg Oral Q breakfast Mansy, Jan A, MD       sertraline (ZOLOFT) tablet 25 mg  25 mg Oral Daily Mansy, Jan A, MD   25 mg at 12/12/20 1103   sucralfate (CARAFATE) tablet 1 g  1 g Oral TID PC Mansy, Jan A, MD   1 g at 12/12/20 1103   tamsulosin (FLOMAX) capsule 0.4 mg  0.4 mg Oral Daily Mansy, Jan A, MD   0.4 mg at 12/12/20 1102   venlafaxine XR (EFFEXOR-XR) 24 hr capsule 37.5 mg  37.5 mg Oral Daily Mansy, Jan A, MD   37.5 mg at 12/12/20 1105   vitamin B-12 (CYANOCOBALAMIN) tablet 2,000 mcg  2,000 mcg Oral Daily Mansy, Jan A, MD   2,000 mcg at 12/12/20 1102   zolpidem (AMBIEN) tablet 5 mg  5 mg Oral QHS PRN Mansy, Arvella Merles, MD       Facility-Administered Medications Ordered in Other Encounters  Medication Dose Route Frequency Provider Last Rate Last Admin   leuprolide (LUPRON) injection 30 mg  30 mg Intramuscular Once Noreene Filbert, MD         Discharge Medications: Please see discharge summary for a list of discharge medications.  Relevant Imaging Results:  Relevant Lab Results:   Additional Information SSN [id number]  Beverly Sessions, RN

## 2020-12-12 NOTE — TOC Initial Note (Signed)
Transition of Care [facility]) - Initial/Assessment Note    Patient Details  Name: Douglas Clark MRN: [id number] Date of Birth: 1940-08-11  [facility] CM/SW Contact:    Beverly Sessions, RN Phone Number: 12/12/2020, 3:15 PM  Clinical Narrative:                 Patient admitted from home with COPD Assessment completed with wife via phone Patient lives at home with wife Open with [facility] hospice.  Spoke with [name] at [employer] to confirm, she is checking and will call back  PT recommending SNF.  Wife in agreement, and is aware that she would have to rescind hospice services  [facility] [place] [facility]  Patient has hospital bed, shower chair, BSC, and RW  Existing PASRR FL2 sent for signature Bed search initiated    [name] confirms patient is active with [facility] hospice TOC to notify her of DC disposition   Expected Discharge Plan: Skilled Nursing Facility Barriers to Discharge: Continued Medical Work up   Patient Goals and CMS Choice        Expected Discharge Plan and Services Expected Discharge Plan: [place]   Discharge Planning Services: CM Consult   Living arrangements for the past 2 months: Single Family Home                                      Prior Living Arrangements/Services Living arrangements for the past 2 months: Single Family Home Lives with:: Spouse Patient language and need for interpreter reviewed:: Yes        Need for Family Participation in Patient Care: Yes (Comment) Care giver support system in place?: Yes (comment) Current home services: Hospice Criminal Activity/Legal Involvement Pertinent to Current Situation/Hospitalization: No - Comment as needed  Activities of Daily Living Home Assistive Devices/Equipment: Shower chair with back, Environmental consultant (specify type) ADL Screening (condition at time of admission) Patient's cognitive ability adequate to safely complete daily activities?: No Is the  patient deaf or have difficulty hearing?: Yes Does the patient have difficulty seeing, even when wearing glasses/contacts?: Yes Does the patient have difficulty concentrating, remembering, or making decisions?: No (not at time of assessment) Patient able to express need for assistance with ADLs?: Yes Does the patient have difficulty dressing or bathing?: Yes Independently performs ADLs?: Yes (appropriate for developmental age) (per pt.) Communication: Independent Dressing (OT): Needs assistance Is this a change from baseline?: Change from baseline, expected to last >3 days Grooming: Needs assistance Is this a change from baseline?: Change from baseline, expected to last >3 days Feeding: Independent Bathing: Needs assistance Is this a change from baseline?: Change from baseline, expected to last >3 days Toileting: Needs assistance Is this a change from baseline?: Change from baseline, expected to last >3days In/Out Bed: Needs assistance Is this a change from baseline?: Change from baseline, expected to last >3 days Walks in Home: Needs assistance (uses walker) Is this a change from baseline?: Pre-admission baseline Does the patient have difficulty walking or climbing stairs?: Yes Weakness of Legs: Both Weakness of Arms/Hands: None  Permission Sought/Granted                  Emotional Assessment       Orientation: : Oriented to Self Alcohol / Substance Use: Not Applicable Psych Involvement: No (comment)  Admission diagnosis:  COPD exacerbation ([place]) [J44.1] COPD with acute exacerbation ([place]) [  J44.1] Frequent falls [R29.6] Patient Active Problem List   Diagnosis Date Noted   COPD exacerbation ([place]) 12/11/2020   Prostate cancer ([place]) 05/09/2018   Pneumonia 01/21/2018   Parkinsonism ([place]) 07/15/2015   CTS (carpal tunnel syndrome) 07/10/2015   Neck pain 06/24/2015   Memory loss 06/11/2015   Abnormality of gait 06/11/2015   Lumbar stenosis with neurogenic claudication  02/04/2015   Bifascicular block 12/13/2013   CAD (coronary artery disease) of artery bypass graft 12/13/2013   Essential hypertension 12/13/2013   Anxiety and depression 12/13/2013   Tobacco use 12/13/2013   PCP:  Idelle Crouch, MD [id number] [place] [place] [address] [place] [address] Phone: [phone] Fax: [phone]     Social Determinants of Health (SDOH) Interventions    Readmission Risk Interventions No flowsheet data found.

## 2020-12-13 DIAGNOSIS — J441 Chronic obstructive pulmonary disease with (acute) exacerbation: Secondary | ICD-10-CM | POA: Diagnosis not present

## 2020-12-13 MED ORDER — CEPHALEXIN 500 MG PO CAPS
500.0000 mg | ORAL_CAPSULE | Freq: Four times a day (QID) | ORAL | Status: AC
  Administered 2020-12-13 – 2020-12-16 (×12): 500 mg via ORAL
  Filled 2020-12-13 (×12): qty 1

## 2020-12-13 NOTE — Plan of Care (Signed)

## 2020-12-13 NOTE — Progress Notes (Signed)
PROGRESS NOTE    Douglas Clark  EZM:[id number] DOB: 23-May-1940 DOA: 12/11/2020 PCP: Idelle Crouch, MD     Brief Narrative:  Douglas Clark is a 80 y.o. Caucasian male with medical history significant for coronary artery disease, GERD, emphysema, dementia, and anxiety, osteoarthritis, hypertension, [name], who presented to the emergency room with acute onset of fall out of bed earlier today and increasing weakness lately.  The patient was at [facility] for 1 week last week to give his wife a break as it has been difficult for her to care for him at home.  He has been home a couple of days ago.  It has been difficult for her to get him around as he has been reliant on his wheelchair and others for ADLs.  He stated that when he fell today and hit his head but denies any presyncope or syncope.  No paresthesias or focal muscle weakness.  He was complaining of left hip pain after his fall.  He has been having increasing cough productive of greenish sputum lately with associated dyspnea with occasional wheezing. Chest x-ray showed no acute cardiopulmonary disease.  Left hip x-ray showed no acute abnormalities.  Left hip CT showed no osseous abnormality.   New events last 24 hours / Subjective: No new complaints, states he was able to work with PT OT. Recommended for SNF placement, although patient is unsure if he wants to return back to SNF.   Assessment & Plan:   Active Problems:   COPD exacerbation (HCC)  Acute bronchitis with COPD exacerbation  -De-escalate steroids to p.o. -Rocephin --> keflex  -Breathing treatments -Remains on room air   Recurrent falls and generalized weakness -PT OT rec for SNF placement. TOC consulted   Hypertension -Continue Coreg, Cozaar  Anxiety/depression -Continue Klonopin, Zoloft, Effexor  BPH -Continue Flomax   DVT prophylaxis:  enoxaparin (LOVENOX) injection 40 mg Start: 12/12/20 0800  Code Status:      Code Status Orders  (From admission, onward)           Start     Ordered   12/11/20 2124  Full code  Continuous        12/11/20 2124           Code Status History     Date Active Date Inactive Code Status Order ID Comments User Context   01/21/2018 1937 01/23/2018 1838 Full Code [id number]  Hillary Bow, MD ED   02/04/2015 1715 02/10/2015 1902 Full Code [id number]  Leeroy Cha, MD Inpatient      Family Communication: Wife over the phone  Disposition Plan:  Status is: Observation  The patient remains OBS appropriate due to lack of medical necessity.   Dispo: The patient is from: Home              Anticipated d/c is to: SNF              Patient currently is medically stable to d/c. Waiting SNF placement.    Difficult to place patient No         Antimicrobials:  Anti-infectives (From admission, onward)    Start     Dose/Rate Route Frequency Ordered Stop   12/11/20 2200  cefTRIAXone (ROCEPHIN) 1 g in sodium chloride 0.9 % 100 mL IVPB        1 g 200 mL/hr over 30 Minutes Intravenous Every 24 hours 12/11/20 2124 12/16/20 2159        Objective: Vitals:  12/12/20 1520 12/12/20 2055 12/13/20 0501 12/13/20 0729  BP: 137/78 110/67 125/78 (!) 136/95  Pulse: 64 71 70 72  Resp: 18 18 20 18   Temp: 98.6 F (37 C) 98.5 F (36.9 C) 97.6 F (36.4 C) 97.6 F (36.4 C)  TempSrc:  Oral Oral Oral  SpO2: 100% 99% 98% 99%  Weight:      Height:        Intake/Output Summary (Last 24 hours) at 12/13/2020 1019 Last data filed at 12/13/2020 0900 Gross per 24 hour  Intake 800 ml  Output 1100 ml  Net -300 ml    Filed Weights   12/11/20 2343  Weight: 54 kg    Examination: General exam: Appears calm and comfortable, frail and cachectic appearing Respiratory system: Diminished breath sounds bilaterally without increase in respiratory effort.  Remains on room air Cardiovascular system: S1 & S2 heard, RRR. No pedal edema. Gastrointestinal system: Abdomen is  nondistended, soft and nontender. Normal bowel sounds heard. Central nervous system: Alert and oriented. Non focal exam. Speech clear  Extremities: Symmetric in appearance bilaterally  Skin: No rashes, lesions or ulcers on exposed skin  Psychiatry: Judgement and insight appear stable. Mood & affect appropriate.    Data Reviewed: I have personally reviewed following labs and imaging studies  CBC: Recent Labs  Lab 12/11/20 1944 12/12/20 0510  WBC 9.2 7.6  NEUTROABS 7.1  --   HGB 13.3 13.2  HCT 39.5 38.1*  MCV 101.8* 101.6*  PLT 113* 106*    Basic Metabolic Panel: Recent Labs  Lab 12/11/20 1944 12/12/20 0510  NA 138 138  K 4.5 4.2  CL 102 101  CO2 28 25  GLUCOSE 92 126*  BUN 18 23  CREATININE 1.17 1.12  CALCIUM 9.4 9.1    GFR: Estimated Creatinine Clearance: 41.5 mL/min (by C-G formula based on SCr of 1.12 mg/dL). Liver Function Tests: Recent Labs  Lab 12/11/20 1944  AST 18  ALT 15  ALKPHOS 49  BILITOT 1.5*  PROT 7.0  ALBUMIN 3.7    No results for input(s): LIPASE, AMYLASE in the last 168 hours. No results for input(s): AMMONIA in the last 168 hours. Coagulation Profile: No results for input(s): INR, PROTIME in the last 168 hours. Cardiac Enzymes: No results for input(s): CKTOTAL, CKMB, CKMBINDEX, TROPONINI in the last 168 hours. BNP (last 3 results) No results for input(s): PROBNP in the last 8760 hours. HbA1C: No results for input(s): HGBA1C in the last 72 hours. CBG: No results for input(s): GLUCAP in the last 168 hours. Lipid Profile: No results for input(s): CHOL, HDL, LDLCALC, TRIG, CHOLHDL, LDLDIRECT in the last 72 hours. Thyroid Function Tests: No results for input(s): TSH, T4TOTAL, FREET4, T3FREE, THYROIDAB in the last 72 hours. Anemia Panel: No results for input(s): VITAMINB12, FOLATE, FERRITIN, TIBC, IRON, RETICCTPCT in the last 72 hours. Sepsis Labs: No results for input(s): PROCALCITON, LATICACIDVEN in the last 168 hours.  Recent  Results (from the past 240 hour(s))  Resp Panel by RT-PCR (Flu A&B, Covid) Nasopharyngeal Swab     Status: None   Collection Time: 12/11/20  7:44 PM   Specimen: Nasopharyngeal Swab; Nasopharyngeal(NP) swabs in vial transport medium  Result Value Ref Range Status   SARS Coronavirus 2 by RT PCR NEGATIVE NEGATIVE Final    Comment: (NOTE) SARS-CoV-2 target nucleic acids are NOT DETECTED.  The SARS-CoV-2 RNA is generally detectable in upper respiratory specimens during the acute phase of infection. The lowest concentration of SARS-CoV-2 viral copies this assay can detect  is 138 copies/mL. A negative result does not preclude SARS-Cov-2 infection and should not be used as the sole basis for treatment or other patient management decisions. A negative result may occur with  improper specimen collection/handling, submission of specimen other than nasopharyngeal swab, presence of viral mutation(s) within the areas targeted by this assay, and inadequate number of viral copies(<138 copies/mL). A negative result must be combined with clinical observations, patient history, and epidemiological information. The expected result is Negative.  Fact Sheet for Patients:  EntrepreneurPulse.com.au  Fact Sheet for Healthcare Providers:  IncredibleEmployment.be  This test is no t yet approved or cleared by the [place] FDA and  has been authorized for detection and/or diagnosis of SARS-CoV-2 by FDA under an Emergency Use Authorization (EUA). This EUA will remain  in effect (meaning this test can be used) for the duration of the COVID-19 declaration under Section 564(b)(1) of the Act, 21 U.S.C.section 360bbb-3(b)(1), unless the authorization is terminated  or revoked sooner.       Influenza A by PCR NEGATIVE NEGATIVE Final   Influenza B by PCR NEGATIVE NEGATIVE Final    Comment: (NOTE) The Xpert Xpress SARS-CoV-2/FLU/RSV plus assay is intended as an aid in the  diagnosis of influenza from Nasopharyngeal swab specimens and should not be used as a sole basis for treatment. Nasal washings and aspirates are unacceptable for Xpert Xpress SARS-CoV-2/FLU/RSV testing.  Fact Sheet for Patients: EntrepreneurPulse.com.au  Fact Sheet for Healthcare Providers: IncredibleEmployment.be  This test is not yet approved or cleared by the [place] FDA and has been authorized for detection and/or diagnosis of SARS-CoV-2 by FDA under an Emergency Use Authorization (EUA). This EUA will remain in effect (meaning this test can be used) for the duration of the COVID-19 declaration under Section 564(b)(1) of the Act, 21 U.S.C. section 360bbb-3(b)(1), unless the authorization is terminated or revoked.  Performed at [facility], [address] [place], [place] [address]   Culture, blood (Routine X 2) w Reflex to ID Panel     Status: None (Preliminary result)   Collection Time: 12/11/20 10:00 PM   Specimen: BLOOD  Result Value Ref Range Status   Specimen Description BLOOD LEFT FOREARM  Final   Special Requests   Final    BOTTLES DRAWN AEROBIC AND ANAEROBIC Blood Culture adequate volume   Culture   Final    NO GROWTH 2 DAYS Performed at [facility], [address], [place], [place] [address]    Report Status PENDING  Incomplete  Culture, blood (Routine X 2) w Reflex to ID Panel     Status: None (Preliminary result)   Collection Time: 12/11/20 11:34 PM   Specimen: BLOOD  Result Value Ref Range Status   Specimen Description BLOOD RIGHT ASSIST CONTROL  Final   Special Requests   Final    BOTTLES DRAWN AEROBIC AND ANAEROBIC Blood Culture results may not be optimal due to an excessive volume of blood received in culture bottles   Culture   Final    NO GROWTH 2 DAYS Performed at [facility], [address], [place], [place] [address]    Report Status PENDING  Incomplete       Radiology  Studies: CT Hip Left Wo Contrast  Result Date: 12/11/2020 CLINICAL DATA:  Fall. EXAM: CT OF THE LEFT HIP WITHOUT CONTRAST TECHNIQUE: Multidetector CT imaging of the left hip was performed according to the standard protocol. Multiplanar CT image reconstructions were also generated. COMPARISON:  Left hip x-rays from  same day. FINDINGS: Bones/Joint/Cartilage No fracture or dislocation. Mild left hip degenerative changes. Osteopenia. No joint effusion. Ligaments Ligaments are suboptimally evaluated by CT. Muscles and Tendons Grossly intact. Soft tissue No fluid collection or hematoma.  No soft tissue mass. IMPRESSION: 1. No acute osseous abnormality. Electronically Signed   By: Titus Dubin M.D.   On: 12/11/2020 19:23   DG Chest Portable 1 View  Result Date: 12/11/2020 CLINICAL DATA:  COPD. EXAM: PORTABLE CHEST 1 VIEW COMPARISON:  Chest radiograph dated 01/22/2018. FINDINGS: There is diffuse chronic interstitial coarsening and bronchitic changes. No focal consolidation, pleural effusion, or pneumothorax. The cardiac silhouette is within limits. Atherosclerotic calcification of the aorta. Median sternotomy wires. No acute osseous pathology. IMPRESSION: No active disease. Electronically Signed   By: Anner Crete M.D.   On: 12/11/2020 19:10   DG Hip Unilat With Pelvis 2-3 Views Left  Result Date: 12/11/2020 CLINICAL DATA:  Fall, left hip pain EXAM: DG HIP (WITH OR WITHOUT PELVIS) 2-3V LEFT COMPARISON:  None. FINDINGS: Osteopenia. No displaced fracture or dislocation of the left hip. Mild superior joint space narrowing osteophytosis. The partially imaged pelvis and proximal right femur are unremarkable. IMPRESSION: No displaced fracture or dislocation of the left hip. Please note that plain radiographs are significantly insensitive for hip and pelvic fracture, particularly in the setting of osteopenia. Recommend CT or MRI to more sensitively evaluate for fracture if clinically suspected. Electronically  Signed   By: Eddie Candle M.D.   On: 12/11/2020 15:45      Scheduled Meds:  aspirin  81 mg Oral Daily   carvedilol  12.5 mg Oral BID WC   cholecalciferol  2,000 Units Oral Daily   clonazePAM  2 mg Oral QHS   enoxaparin (LOVENOX) injection  40 mg Subcutaneous Q24H   fiber   Oral Daily   guaiFENesin  600 mg Oral BID   losartan  50 mg Oral Daily   mirtazapine  30 mg Oral QHS   multivitamin with minerals  1 tablet Oral Daily   pantoprazole  40 mg Oral BID   predniSONE  40 mg Oral Q breakfast   sertraline  25 mg Oral Daily   sucralfate  1 g Oral TID PC   tamsulosin  0.4 mg Oral Daily   venlafaxine XR  37.5 mg Oral Daily   vitamin B-12  2,000 mcg Oral Daily   Continuous Infusions:  cefTRIAXone (ROCEPHIN)  IV 1 g (12/12/20 2120)     LOS: 0 days      Time spent: 20 minutes   Dessa Phi, DO [facility] 12/13/2020, 10:19 AM   Available via Epic secure chat 7am-7pm After these hours, please refer to coverage provider listed on [email or web address]

## 2020-12-14 DIAGNOSIS — J441 Chronic obstructive pulmonary disease with (acute) exacerbation: Secondary | ICD-10-CM | POA: Diagnosis not present

## 2020-12-14 NOTE — TOC Progression Note (Addendum)
Transition of Care [facility]) - Progression Note    Patient Details  Name: Douglas Clark MRN: [id number] Date of Birth: 12/11/1940  Transition [facility] Care [facility] CM/SW Contact  Izola Price, RN Phone Number: 12/14/2020, 3:47 PM  Clinical Narrative: 350 pm 9/25. [facility] accepted for STR. Patient was at [facility] for a week, a week ago per provider notes.  Left VM with spouse and call back number for today RNCM and Monday CM. Notified provider. Left VM/secure text for [facility].  Simmie Davies RN CM      Expected Discharge Plan: Skilled Nursing Facility Barriers to Discharge: Continued Medical Work up  Expected Discharge Plan and Services Expected Discharge Plan: [place]   Discharge Planning Services: CM Consult   Living arrangements for the past 2 months: Single Family Home                                       Social Determinants of Health (SDOH) Interventions    Readmission Risk Interventions No flowsheet data found.

## 2020-12-14 NOTE — Progress Notes (Signed)
PROGRESS NOTE    Douglas Clark  YWV:[id number] DOB: 03/04/1941 DOA: 12/11/2020 PCP: Idelle Crouch, MD     Brief Narrative:  Douglas Clark is a 80 y.o. Caucasian male with medical history significant for coronary artery disease, GERD, emphysema, dementia, and anxiety, osteoarthritis, hypertension, [name], who presented to the emergency room with acute onset of fall out of bed earlier today and increasing weakness lately.  The patient was at [facility] for 1 week last week to give his wife a break as it has been difficult for her to care for him at home.  He has been home a couple of days ago.  It has been difficult for her to get him around as he has been reliant on his wheelchair and others for ADLs.  He stated that when he fell today and hit his head but denies any presyncope or syncope.  No paresthesias or focal muscle weakness.  He was complaining of left hip pain after his fall.  He has been having increasing cough productive of greenish sputum lately with associated dyspnea with occasional wheezing. Chest x-ray showed no acute cardiopulmonary disease.  Left hip x-ray showed no acute abnormalities.  Left hip CT showed no osseous abnormality.   New events last 24 hours / Subjective: Just waking up this morning, voices no new complaints. No acute events overnight.   Assessment & Plan:   Active Problems:   COPD exacerbation (HCC)  Acute bronchitis with COPD exacerbation  -De-escalate steroids to p.o. -Rocephin --> keflex  -Breathing treatments -Remains on room air   Recurrent falls and generalized weakness -PT OT rec for SNF placement. TOC consulted   Hypertension -Continue Coreg, Cozaar  Anxiety/depression -Continue Klonopin, Zoloft, Effexor  BPH -Continue Flomax   DVT prophylaxis:  enoxaparin (LOVENOX) injection 40 mg Start: 12/12/20 0800  Code Status:     Code Status Orders  (From admission, onward)           Start      Ordered   12/11/20 2124  Full code  Continuous        12/11/20 2124           Code Status History     Date Active Date Inactive Code Status Order ID Comments User Context   01/21/2018 1937 01/23/2018 1838 Full Code [id number]  Hillary Bow, MD ED   02/04/2015 1715 02/10/2015 1902 Full Code [id number]  Leeroy Cha, MD Inpatient      Family Communication: None at bedside  Disposition Plan:  Status is: Observation  The patient remains OBS appropriate due to lack of medical necessity.   Dispo: The patient is from: Home              Anticipated d/c is to: SNF              Patient currently is medically stable to d/c. Waiting SNF placement.    Difficult to place patient No         Antimicrobials:  Anti-infectives (From admission, onward)    Start     Dose/Rate Route Frequency Ordered Stop   12/13/20 2200  cephALEXin (KEFLEX) capsule 500 mg        500 mg Oral Every 6 hours 12/13/20 1024 12/16/20 2359   12/11/20 2200  cefTRIAXone (ROCEPHIN) 1 g in sodium chloride 0.9 % 100 mL IVPB  Status:  Discontinued        1 g 200 mL/hr over 30 Minutes Intravenous Every 24 hours  12/11/20 2124 12/13/20 1024        Objective: Vitals:   12/13/20 1536 12/13/20 1953 12/14/20 0447 12/14/20 0757  BP: 102/66 104/64 (!) 143/86 (!) 147/83  Pulse: 67 64 72 63  Resp: 18 18 16 18   Temp: 98 F (36.7 C) 98.4 F (36.9 C) (!) 97.5 F (36.4 C) 97.7 F (36.5 C)  TempSrc: Oral Oral Oral Oral  SpO2: 98% 99% 100% 99%  Weight:      Height:        Intake/Output Summary (Last 24 hours) at 12/14/2020 1031 Last data filed at 12/14/2020 0546 Gross per 24 hour  Intake 360 ml  Output 600 ml  Net -240 ml    Filed Weights   12/11/20 2343  Weight: 54 kg    Examination: General exam: Appears calm and comfortable  Respiratory system: Clear to auscultation. Respiratory effort normal. On room air  Cardiovascular system: S1 & S2 heard, RRR. No pedal edema. Gastrointestinal system: Abdomen is  nondistended, soft and nontender. Normal bowel sounds heard. Central nervous system: Alert  Extremities: Symmetric in appearance bilaterally  Skin: No rashes, lesions or ulcers on exposed skin  Psychiatry: Stable    Data Reviewed: I have personally reviewed following labs and imaging studies  CBC: Recent Labs  Lab 12/11/20 1944 12/12/20 0510  WBC 9.2 7.6  NEUTROABS 7.1  --   HGB 13.3 13.2  HCT 39.5 38.1*  MCV 101.8* 101.6*  PLT 113* 106*    Basic Metabolic Panel: Recent Labs  Lab 12/11/20 1944 12/12/20 0510  NA 138 138  K 4.5 4.2  CL 102 101  CO2 28 25  GLUCOSE 92 126*  BUN 18 23  CREATININE 1.17 1.12  CALCIUM 9.4 9.1    GFR: Estimated Creatinine Clearance: 41.5 mL/min (by C-G formula based on SCr of 1.12 mg/dL). Liver Function Tests: Recent Labs  Lab 12/11/20 1944  AST 18  ALT 15  ALKPHOS 49  BILITOT 1.5*  PROT 7.0  ALBUMIN 3.7    No results for input(s): LIPASE, AMYLASE in the last 168 hours. No results for input(s): AMMONIA in the last 168 hours. Coagulation Profile: No results for input(s): INR, PROTIME in the last 168 hours. Cardiac Enzymes: No results for input(s): CKTOTAL, CKMB, CKMBINDEX, TROPONINI in the last 168 hours. BNP (last 3 results) No results for input(s): PROBNP in the last 8760 hours. HbA1C: No results for input(s): HGBA1C in the last 72 hours. CBG: No results for input(s): GLUCAP in the last 168 hours. Lipid Profile: No results for input(s): CHOL, HDL, LDLCALC, TRIG, CHOLHDL, LDLDIRECT in the last 72 hours. Thyroid Function Tests: No results for input(s): TSH, T4TOTAL, FREET4, T3FREE, THYROIDAB in the last 72 hours. Anemia Panel: No results for input(s): VITAMINB12, FOLATE, FERRITIN, TIBC, IRON, RETICCTPCT in the last 72 hours. Sepsis Labs: No results for input(s): PROCALCITON, LATICACIDVEN in the last 168 hours.  Recent Results (from the past 240 hour(s))  Resp Panel by RT-PCR (Flu A&B, Covid) Nasopharyngeal Swab      Status: None   Collection Time: 12/11/20  7:44 PM   Specimen: Nasopharyngeal Swab; Nasopharyngeal(NP) swabs in vial transport medium  Result Value Ref Range Status   SARS Coronavirus 2 by RT PCR NEGATIVE NEGATIVE Final    Comment: (NOTE) SARS-CoV-2 target nucleic acids are NOT DETECTED.  The SARS-CoV-2 RNA is generally detectable in upper respiratory specimens during the acute phase of infection. The lowest concentration of SARS-CoV-2 viral copies this assay can detect is 138 copies/mL. A negative  result does not preclude SARS-Cov-2 infection and should not be used as the sole basis for treatment or other patient management decisions. A negative result may occur with  improper specimen collection/handling, submission of specimen other than nasopharyngeal swab, presence of viral mutation(s) within the areas targeted by this assay, and inadequate number of viral copies(<138 copies/mL). A negative result must be combined with clinical observations, patient history, and epidemiological information. The expected result is Negative.  Fact Sheet for Patients:  EntrepreneurPulse.com.au  Fact Sheet for Healthcare Providers:  IncredibleEmployment.be  This test is no t yet approved or cleared by the [place] FDA and  has been authorized for detection and/or diagnosis of SARS-CoV-2 by FDA under an Emergency Use Authorization (EUA). This EUA will remain  in effect (meaning this test can be used) for the duration of the COVID-19 declaration under Section 564(b)(1) of the Act, 21 U.S.C.section 360bbb-3(b)(1), unless the authorization is terminated  or revoked sooner.       Influenza A by PCR NEGATIVE NEGATIVE Final   Influenza B by PCR NEGATIVE NEGATIVE Final    Comment: (NOTE) The Xpert Xpress SARS-CoV-2/FLU/RSV plus assay is intended as an aid in the diagnosis of influenza from Nasopharyngeal swab specimens and should not be used as a sole basis  for treatment. Nasal washings and aspirates are unacceptable for Xpert Xpress SARS-CoV-2/FLU/RSV testing.  Fact Sheet for Patients: EntrepreneurPulse.com.au  Fact Sheet for Healthcare Providers: IncredibleEmployment.be  This test is not yet approved or cleared by the [place] FDA and has been authorized for detection and/or diagnosis of SARS-CoV-2 by FDA under an Emergency Use Authorization (EUA). This EUA will remain in effect (meaning this test can be used) for the duration of the COVID-19 declaration under Section 564(b)(1) of the Act, 21 U.S.C. section 360bbb-3(b)(1), unless the authorization is terminated or revoked.  Performed at [facility], [address] [place], [place] [address]   Culture, blood (Routine X 2) w Reflex to ID Panel     Status: None (Preliminary result)   Collection Time: 12/11/20 10:00 PM   Specimen: BLOOD  Result Value Ref Range Status   Specimen Description BLOOD LEFT FOREARM  Final   Special Requests   Final    BOTTLES DRAWN AEROBIC AND ANAEROBIC Blood Culture adequate volume   Culture   Final    NO GROWTH 3 DAYS Performed at [facility], [address] [place], [place] [address]    Report Status PENDING  Incomplete  Culture, blood (Routine X 2) w Reflex to ID Panel     Status: None (Preliminary result)   Collection Time: 12/11/20 11:34 PM   Specimen: BLOOD  Result Value Ref Range Status   Specimen Description BLOOD RIGHT ASSIST CONTROL  Final   Special Requests   Final    BOTTLES DRAWN AEROBIC AND ANAEROBIC Blood Culture results may not be optimal due to an excessive volume of blood received in culture bottles   Culture   Final    NO GROWTH 3 DAYS Performed at [facility], [address], [place], [place] [address]    Report Status PENDING  Incomplete       Radiology Studies: No results found.    Scheduled Meds:  aspirin  81 mg Oral Daily   carvedilol  12.5  mg Oral BID WC   cephALEXin  500 mg Oral Q6H   cholecalciferol  2,000 Units Oral Daily   clonazePAM  2 mg Oral QHS   enoxaparin (LOVENOX) injection  40 mg Subcutaneous Q24H   fiber   Oral Daily   guaiFENesin  600 mg Oral BID   losartan  50 mg Oral Daily   mirtazapine  30 mg Oral QHS   multivitamin with minerals  1 tablet Oral Daily   pantoprazole  40 mg Oral BID   predniSONE  40 mg Oral Q breakfast   sertraline  25 mg Oral Daily   sucralfate  1 g Oral TID PC   tamsulosin  0.4 mg Oral Daily   venlafaxine XR  37.5 mg Oral Daily   vitamin B-12  2,000 mcg Oral Daily   Continuous Infusions:     LOS: 0 days      Time spent: 15 minutes   Dessa Phi, DO [facility] 12/14/2020, 10:31 AM   Available via Epic secure chat 7am-7pm After these hours, please refer to coverage provider listed on [email or web address]

## 2020-12-15 ENCOUNTER — Other Ambulatory Visit: Payer: Self-pay

## 2020-12-15 DIAGNOSIS — J441 Chronic obstructive pulmonary disease with (acute) exacerbation: Secondary | ICD-10-CM | POA: Diagnosis not present

## 2020-12-15 LAB — RESP PANEL BY RT-PCR (FLU A&B, COVID) ARPGX2
Influenza A by PCR: NEGATIVE
Influenza B by PCR: NEGATIVE
SARS Coronavirus 2 by RT PCR: NEGATIVE

## 2020-12-15 MED ORDER — PREDNISONE 20 MG PO TABS: 40.0000 mg | ORAL_TABLET | Freq: Every day | ORAL | 0 refills | Status: AC

## 2020-12-15 MED ORDER — MAGNESIUM HYDROXIDE 400 MG/5ML PO SUSP
30.0000 mL | Freq: Once | ORAL | Status: AC
  Administered 2020-12-15: 30 mL via ORAL
  Filled 2020-12-15: qty 30

## 2020-12-15 MED ORDER — CLONAZEPAM 2 MG PO TABS: 2.0000 mg | ORAL_TABLET | Freq: Every day | ORAL | 0 refills | Status: AC

## 2020-12-15 MED ORDER — FLEET ENEMA 7-19 GM/118ML RE ENEM: 1.0000 | ENEMA | Freq: Every day | RECTAL | Status: DC | PRN

## 2020-12-15 MED ORDER — INFLUENZA VAC A&B SA ADJ QUAD 0.5 ML IM PRSY
0.5000 mL | PREFILLED_SYRINGE | INTRAMUSCULAR | Status: AC
  Administered 2020-12-18: 0.5 mL via INTRAMUSCULAR
  Filled 2020-12-15: qty 0.5

## 2020-12-15 MED ORDER — LACTULOSE 10 GM/15ML PO SOLN
30.0000 g | Freq: Two times a day (BID) | ORAL | Status: DC | PRN
  Filled 2020-12-15: qty 60

## 2020-12-15 MED ORDER — CEPHALEXIN 500 MG PO CAPS: 500.0000 mg | ORAL_CAPSULE | Freq: Four times a day (QID) | ORAL | 0 refills | Status: AC

## 2020-12-15 MED ORDER — HYDROCODONE-ACETAMINOPHEN 10-325 MG PO TABS: 1.0000 | ORAL_TABLET | Freq: Four times a day (QID) | ORAL | 0 refills | Status: AC | PRN

## 2020-12-15 NOTE — Discharge Summary (Addendum)
Physician Discharge Summary  Douglas Clark [facility] EZM:[id number] DOB: 05-12-1940 DOA: 12/11/2020  PCP: Idelle Crouch, MD  Admit date: 12/11/2020 Discharge date: 12/15/2020  Admitted From: Home Disposition:  Home with home hospice   Recommendations for Outpatient Follow-up:  Follow up with PCP in 1 week  Discharge Condition: Stable CODE STATUS: Full  Diet recommendation: Heart healthy   Brief/Interim Summary: Douglas Clark is a 80 y.o. Caucasian male with medical history significant for coronary artery disease, GERD, emphysema, dementia, and anxiety, osteoarthritis, hypertension, [name], who presented to the emergency room with acute onset of fall out of bed earlier today and increasing weakness lately.  The patient was at [facility] for 1 week last week to give his wife a break as it has been difficult for her to care for him at home.  He has been home a couple of days ago.  It has been difficult for her to get him around as he has been reliant on his wheelchair and others for ADLs.  He stated that when he fell today and hit his head but denies any presyncope or syncope.  No paresthesias or focal muscle weakness.  He was complaining of left hip pain after his fall.  He has been having increasing cough productive of greenish sputum lately with associated dyspnea with occasional wheezing. Chest x-ray showed no acute cardiopulmonary disease.  Left hip x-ray showed no acute abnormalities.  Left hip CT showed no osseous abnormality.    Patient was treated for acute bronchitis with antibiotics.  PT OT evaluated patient and recommended for SNF placement, but was denied by insurance.   Discharge Diagnoses:  Active Problems:   COPD exacerbation (HCC)   Acute bronchitis with COPD exacerbation  -De-escalate steroids to p.o. steroid burst -Rocephin --> keflex  -Breathing treatments -Remains on room air   Recurrent falls and generalized weakness -PT  OT  Hypertension -Continue Coreg, Cozaar  Anxiety/depression -Continue Klonopin, Zoloft, Effexor  BPH -Continue Flomax   Discharge Instructions  Discharge Instructions     Increase activity slowly   Complete by: As directed       Allergies as of 12/15/2020       Reactions   Donepezil    Doxazosin Hives   Trazodone And Nefazodone Other (See Comments)   shivers   Hctz [hydrochlorothiazide] Rash   blisters        Medication List     TAKE these medications    amitriptyline 25 MG tablet Commonly known as: ELAVIL Take 25 mg by mouth at bedtime.   Apoaequorin 10 MG Caps Take 1 capsule by mouth daily.   aspirin 81 MG chewable tablet Chew 81 mg by mouth daily.   BENEFIBER DRINK MIX PO Take 5 mLs by mouth daily.   carvedilol 12.5 MG tablet Commonly known as: COREG Take 1 tablet (12.5 mg total) by mouth 2 (two) times daily with a meal.   cephALEXin 500 MG capsule Commonly known as: KEFLEX Take 1 capsule (500 mg total) by mouth every 6 (six) hours for 2 days.   cholecalciferol 1000 units tablet Commonly known as: VITAMIN D Take 2,000 Units by mouth daily.   clonazePAM 2 MG tablet Commonly known as: KLONOPIN Take 1 tablet (2 mg total) by mouth at bedtime.   Cranberry 400 MG Tabs Take 800 mg by mouth daily.   HYDROcodone-acetaminophen 10-325 MG tablet Commonly known as: NORCO Take 1 tablet by mouth every 6 (six) hours as needed for up to 3  days for moderate pain. for pain   losartan 50 MG tablet Commonly known as: COZAAR Take 50 mg by mouth daily.   mirtazapine 30 MG tablet Commonly known as: REMERON Take 30 mg by mouth at bedtime.   Multi-Vitamins Tabs Take 1 tablet by mouth daily.   nitroGLYCERIN 0.4 MG SL tablet Commonly known as: NITROSTAT Place 1 tablet (0.4 mg total) under the tongue every 5 (five) minutes as needed for chest pain.   pantoprazole 40 MG tablet Commonly known as: PROTONIX Take 40 mg by mouth 2 (two) times daily.    predniSONE 20 MG tablet Commonly known as: DELTASONE Take 2 tablets (40 mg total) by mouth daily with breakfast for 1 day. Start taking on: December 16, 2020   sertraline 25 MG tablet Commonly known as: ZOLOFT Take 25 mg by mouth daily.   sucralfate 1 g tablet Commonly known as: CARAFATE Take 1 g by mouth 3 (three) times daily after meals.   tamsulosin 0.4 MG Caps capsule Commonly known as: FLOMAX Take 0.4 mg by mouth daily.   venlafaxine XR 37.5 MG 24 hr capsule Commonly known as: EFFEXOR-XR Take 37.5 mg by mouth daily.   vitamin B-12 1000 MCG tablet Commonly known as: CYANOCOBALAMIN Take 2,000 mcg by mouth daily.        Contact information for follow-up providers     Idelle Crouch, MD. Schedule an appointment as soon as possible for a visit in 1 week(s).   Specialty: Internal Medicine Contact information: [place] [address] [phone]              Contact information for after-discharge care     Destination     [facility] Preferred SNF .   Service: Skilled Nursing Contact information: [place] [address] [phone]                    Allergies  Allergen Reactions   Donepezil    Doxazosin Hives   Trazodone And Nefazodone Other (See Comments)    shivers   Hctz [Hydrochlorothiazide] Rash    blisters    Consultations: None    Procedures/Studies: CT Hip Left Wo Contrast  Result Date: 12/11/2020 CLINICAL DATA:  Fall. EXAM: CT OF THE LEFT HIP WITHOUT CONTRAST TECHNIQUE: Multidetector CT imaging of the left hip was performed according to the standard protocol. Multiplanar CT image reconstructions were also generated. COMPARISON:  Left hip x-rays from same day. FINDINGS: Bones/Joint/Cartilage No fracture or dislocation. Mild left hip degenerative changes. Osteopenia. No joint effusion. Ligaments Ligaments are suboptimally evaluated by CT.  Muscles and Tendons Grossly intact. Soft tissue No fluid collection or hematoma.  No soft tissue mass. IMPRESSION: 1. No acute osseous abnormality. Electronically Signed   By: Titus Dubin M.D.   On: 12/11/2020 19:23   DG Chest Portable 1 View  Result Date: 12/11/2020 CLINICAL DATA:  COPD. EXAM: PORTABLE CHEST 1 VIEW COMPARISON:  Chest radiograph dated 01/22/2018. FINDINGS: There is diffuse chronic interstitial coarsening and bronchitic changes. No focal consolidation, pleural effusion, or pneumothorax. The cardiac silhouette is within limits. Atherosclerotic calcification of the aorta. Median sternotomy wires. No acute osseous pathology. IMPRESSION: No active disease. Electronically Signed   By: Anner Crete M.D.   On: 12/11/2020 19:10   DG Hip Unilat With Pelvis 2-3 Views Left  Result Date: 12/11/2020 CLINICAL DATA:  Fall, left hip pain EXAM: DG HIP (WITH OR WITHOUT PELVIS) 2-3V LEFT COMPARISON:  None. FINDINGS: Osteopenia. No displaced fracture or dislocation of the left hip. Mild superior joint space narrowing osteophytosis. The partially imaged pelvis and proximal right femur are unremarkable. IMPRESSION: No displaced fracture or dislocation of the left hip. Please note that plain radiographs are significantly insensitive for hip and pelvic fracture, particularly in the setting of osteopenia. Recommend CT or MRI to more sensitively evaluate for fracture if clinically suspected. Electronically Signed   By: Eddie Candle M.D.   On: 12/11/2020 15:45      Discharge Exam: Vitals:   12/15/20 0552 12/15/20 0737  BP: (!) 153/88 (!) 169/87  Pulse: 65 68  Resp: 18 18  Temp: 97.6 F (36.4 C) 98.6 F (37 C)  SpO2: 98% 99%    General: Pt is alert, awake, not in acute distress, frail appearing  Cardiovascular: RRR, S1/S2 +, no edema Respiratory: CTA bilaterally, no wheezing, no rhonchi, no respiratory distress, no conversational dyspnea, on room air  Abdominal: Soft, NT, ND, bowel sounds  + Extremities: no edema, no cyanosis Psych: Normal mood and affect, short term memory loss    The results of significant diagnostics from this hospitalization (including imaging, microbiology, ancillary and laboratory) are listed below for reference.     Microbiology: Recent Results (from the past 240 hour(s))  Resp Panel by RT-PCR (Flu A&B, Covid) Nasopharyngeal Swab     Status: None   Collection Time: 12/11/20  7:44 PM   Specimen: Nasopharyngeal Swab; Nasopharyngeal(NP) swabs in vial transport medium  Result Value Ref Range Status   SARS Coronavirus 2 by RT PCR NEGATIVE NEGATIVE Final    Comment: (NOTE) SARS-CoV-2 target nucleic acids are NOT DETECTED.  The SARS-CoV-2 RNA is generally detectable in upper respiratory specimens during the acute phase of infection. The lowest concentration of SARS-CoV-2 viral copies this assay can detect is 138 copies/mL. A negative result does not preclude SARS-Cov-2 infection and should not be used as the sole basis for treatment or other patient management decisions. A negative result may occur with  improper specimen collection/handling, submission of specimen other than nasopharyngeal swab, presence of viral mutation(s) within the areas targeted by this assay, and inadequate number of viral copies(<138 copies/mL). A negative result must be combined with clinical observations, patient history, and epidemiological information. The expected result is Negative.  Fact Sheet for Patients:  EntrepreneurPulse.com.au  Fact Sheet for Healthcare Providers:  IncredibleEmployment.be  This test is no t yet approved or cleared by the [place] FDA and  has been authorized for detection and/or diagnosis of SARS-CoV-2 by FDA under an Emergency Use Authorization (EUA). This EUA will remain  in effect (meaning this test can be used) for the duration of the COVID-19 declaration under Section 564(b)(1) of the Act,  21 U.S.C.section 360bbb-3(b)(1), unless the authorization is terminated  or revoked sooner.       Influenza A by PCR NEGATIVE NEGATIVE Final   Influenza B by PCR NEGATIVE NEGATIVE Final    Comment: (NOTE) The Xpert Xpress SARS-CoV-2/FLU/RSV plus assay is intended as an aid in the diagnosis of influenza from Nasopharyngeal swab specimens and should not be used as a sole basis for treatment. Nasal washings and aspirates are unacceptable for Xpert Xpress SARS-CoV-2/FLU/RSV testing.  Fact Sheet for Patients: EntrepreneurPulse.com.au  Fact Sheet for Healthcare Providers: IncredibleEmployment.be  This test is not yet approved or cleared by the [place] FDA and has been authorized for detection and/or diagnosis of SARS-CoV-2 by FDA under an Emergency Use Authorization (EUA). This EUA will remain in  effect (meaning this test can be used) for the duration of the COVID-19 declaration under Section 564(b)(1) of the Act, 21 U.S.C. section 360bbb-3(b)(1), unless the authorization is terminated or revoked.  Performed at [facility], [address] [place], [place] [address]   Culture, blood (Routine X 2) w Reflex to ID Panel     Status: None (Preliminary result)   Collection Time: 12/11/20 10:00 PM   Specimen: BLOOD  Result Value Ref Range Status   Specimen Description BLOOD LEFT FOREARM  Final   Special Requests   Final    BOTTLES DRAWN AEROBIC AND ANAEROBIC Blood Culture adequate volume   Culture   Final    NO GROWTH 4 DAYS Performed at [facility], [address], [place], [place] [address]    Report Status PENDING  Incomplete  Culture, blood (Routine X 2) w Reflex to ID Panel     Status: None (Preliminary result)   Collection Time: 12/11/20 11:34 PM   Specimen: BLOOD  Result Value Ref Range Status   Specimen Description BLOOD RIGHT ASSIST CONTROL  Final   Special Requests   Final    BOTTLES DRAWN AEROBIC AND  ANAEROBIC Blood Culture results may not be optimal due to an excessive volume of blood received in culture bottles   Culture   Final    NO GROWTH 4 DAYS Performed at [facility], [address], [place], [place] [address]    Report Status PENDING  Incomplete  Resp Panel by RT-PCR (Flu A&B, Covid) Nasopharyngeal Swab     Status: None   Collection Time: 12/15/20  9:10 AM   Specimen: Nasopharyngeal Swab; Nasopharyngeal(NP) swabs in vial transport medium  Result Value Ref Range Status   SARS Coronavirus 2 by RT PCR NEGATIVE NEGATIVE Final    Comment: (NOTE) SARS-CoV-2 target nucleic acids are NOT DETECTED.  The SARS-CoV-2 RNA is generally detectable in upper respiratory specimens during the acute phase of infection. The lowest concentration of SARS-CoV-2 viral copies this assay can detect is 138 copies/mL. A negative result does not preclude SARS-Cov-2 infection and should not be used as the sole basis for treatment or other patient management decisions. A negative result may occur with  improper specimen collection/handling, submission of specimen other than nasopharyngeal swab, presence of viral mutation(s) within the areas targeted by this assay, and inadequate number of viral copies(<138 copies/mL). A negative result must be combined with clinical observations, patient history, and epidemiological information. The expected result is Negative.  Fact Sheet for Patients:  EntrepreneurPulse.com.au  Fact Sheet for Healthcare Providers:  IncredibleEmployment.be  This test is no t yet approved or cleared by the [place] FDA and  has been authorized for detection and/or diagnosis of SARS-CoV-2 by FDA under an Emergency Use Authorization (EUA). This EUA will remain  in effect (meaning this test can be used) for the duration of the COVID-19 declaration under Section 564(b)(1) of the Act, 21 U.S.C.section 360bbb-3(b)(1), unless the  authorization is terminated  or revoked sooner.       Influenza A by PCR NEGATIVE NEGATIVE Final   Influenza B by PCR NEGATIVE NEGATIVE Final    Comment: (NOTE) The Xpert Xpress SARS-CoV-2/FLU/RSV plus assay is intended as an aid in the diagnosis of influenza from Nasopharyngeal swab specimens and should not be used as a sole basis for treatment. Nasal washings and aspirates are unacceptable for Xpert Xpress SARS-CoV-2/FLU/RSV testing.  Fact Sheet for Patients: EntrepreneurPulse.com.au  Fact Sheet for Healthcare Providers: IncredibleEmployment.be  This  test is not yet approved or cleared by the [place] and has been authorized for detection and/or diagnosis of SARS-CoV-2 by FDA under an Emergency Use Authorization (EUA). This EUA will remain in effect (meaning this test can be used) for the duration of the COVID-19 declaration under Section 564(b)(1) of the Act, 21 U.S.C. section 360bbb-3(b)(1), unless the authorization is terminated or revoked.  Performed at [facility], [address] [place], [place] [address]      Labs: BNP (last 3 results) No results for input(s): BNP in the last 8760 hours. Basic Metabolic Panel: Recent Labs  Lab 12/11/20 1944 12/12/20 0510  NA 138 138  K 4.5 4.2  CL 102 101  CO2 28 25  GLUCOSE 92 126*  BUN 18 23  CREATININE 1.17 1.12  CALCIUM 9.4 9.1   Liver Function Tests: Recent Labs  Lab 12/11/20 1944  AST 18  ALT 15  ALKPHOS 49  BILITOT 1.5*  PROT 7.0  ALBUMIN 3.7   No results for input(s): LIPASE, AMYLASE in the last 168 hours. No results for input(s): AMMONIA in the last 168 hours. CBC: Recent Labs  Lab 12/11/20 1944 12/12/20 0510  WBC 9.2 7.6  NEUTROABS 7.1  --   HGB 13.3 13.2  HCT 39.5 38.1*  MCV 101.8* 101.6*  PLT 113* 106*   Cardiac Enzymes: No results for input(s): CKTOTAL, CKMB, CKMBINDEX, TROPONINI in the last 168 hours. BNP: Invalid input(s):  POCBNP CBG: No results for input(s): GLUCAP in the last 168 hours. D-Dimer No results for input(s): DDIMER in the last 72 hours. Hgb A1c No results for input(s): HGBA1C in the last 72 hours. Lipid Profile No results for input(s): CHOL, HDL, LDLCALC, TRIG, CHOLHDL, LDLDIRECT in the last 72 hours. Thyroid function studies No results for input(s): TSH, T4TOTAL, T3FREE, THYROIDAB in the last 72 hours.  Invalid input(s): FREET3 Anemia work up No results for input(s): VITAMINB12, FOLATE, FERRITIN, TIBC, IRON, RETICCTPCT in the last 72 hours. Urinalysis    Component Value Date/Time   COLORURINE AMBER (A) 03/26/2020 1659   APPEARANCEUR CLEAR (A) 03/26/2020 1659   APPEARANCEUR Hazy (A) 10/10/2018 1606   LABSPEC 1.016 03/26/2020 1659   PHURINE 6.0 03/26/2020 1659   GLUCOSEU NEGATIVE 03/26/2020 1659   HGBUR SMALL (A) 03/26/2020 1659   BILIRUBINUR NEGATIVE 03/26/2020 1659   BILIRUBINUR Negative 10/10/2018 1606   KETONESUR 20 (A) 03/26/2020 1659   PROTEINUR 30 (A) 03/26/2020 1659   NITRITE NEGATIVE 03/26/2020 1659   LEUKOCYTESUR TRACE (A) 03/26/2020 1659   Sepsis Labs Invalid input(s): PROCALCITONIN,  WBC,  LACTICIDVEN Microbiology Recent Results (from the past 240 hour(s))  Resp Panel by RT-PCR (Flu A&B, Covid) Nasopharyngeal Swab     Status: None   Collection Time: 12/11/20  7:44 PM   Specimen: Nasopharyngeal Swab; Nasopharyngeal(NP) swabs in vial transport medium  Result Value Ref Range Status   SARS Coronavirus 2 by RT PCR NEGATIVE NEGATIVE Final    Comment: (NOTE) SARS-CoV-2 target nucleic acids are NOT DETECTED.  The SARS-CoV-2 RNA is generally detectable in upper respiratory specimens during the acute phase of infection. The lowest concentration of SARS-CoV-2 viral copies this assay can detect is 138 copies/mL. A negative result does not preclude SARS-Cov-2 infection and should not be used as the sole basis for treatment or other patient management decisions. A negative  result may occur with  improper specimen collection/handling, submission of specimen other than nasopharyngeal swab, presence of viral mutation(s) within the areas targeted by this assay, and  inadequate number of viral copies(<138 copies/mL). A negative result must be combined with clinical observations, patient history, and epidemiological information. The expected result is Negative.  Fact Sheet for Patients:  EntrepreneurPulse.com.au  Fact Sheet for Healthcare Providers:  IncredibleEmployment.be  This test is no t yet approved or cleared by the [place] FDA and  has been authorized for detection and/or diagnosis of SARS-CoV-2 by FDA under an Emergency Use Authorization (EUA). This EUA will remain  in effect (meaning this test can be used) for the duration of the COVID-19 declaration under Section 564(b)(1) of the Act, 21 U.S.C.section 360bbb-3(b)(1), unless the authorization is terminated  or revoked sooner.       Influenza A by PCR NEGATIVE NEGATIVE Final   Influenza B by PCR NEGATIVE NEGATIVE Final    Comment: (NOTE) The Xpert Xpress SARS-CoV-2/FLU/RSV plus assay is intended as an aid in the diagnosis of influenza from Nasopharyngeal swab specimens and should not be used as a sole basis for treatment. Nasal washings and aspirates are unacceptable for Xpert Xpress SARS-CoV-2/FLU/RSV testing.  Fact Sheet for Patients: EntrepreneurPulse.com.au  Fact Sheet for Healthcare Providers: IncredibleEmployment.be  This test is not yet approved or cleared by the [place] FDA and has been authorized for detection and/or diagnosis of SARS-CoV-2 by FDA under an Emergency Use Authorization (EUA). This EUA will remain in effect (meaning this test can be used) for the duration of the COVID-19 declaration under Section 564(b)(1) of the Act, 21 U.S.C. section 360bbb-3(b)(1), unless the authorization is  terminated or revoked.  Performed at [facility], [address] [place], [place] [address]   Culture, blood (Routine X 2) w Reflex to ID Panel     Status: None (Preliminary result)   Collection Time: 12/11/20 10:00 PM   Specimen: BLOOD  Result Value Ref Range Status   Specimen Description BLOOD LEFT FOREARM  Final   Special Requests   Final    BOTTLES DRAWN AEROBIC AND ANAEROBIC Blood Culture adequate volume   Culture   Final    NO GROWTH 4 DAYS Performed at [facility], [address], [place], [place] [address]    Report Status PENDING  Incomplete  Culture, blood (Routine X 2) w Reflex to ID Panel     Status: None (Preliminary result)   Collection Time: 12/11/20 11:34 PM   Specimen: BLOOD  Result Value Ref Range Status   Specimen Description BLOOD RIGHT ASSIST CONTROL  Final   Special Requests   Final    BOTTLES DRAWN AEROBIC AND ANAEROBIC Blood Culture results may not be optimal due to an excessive volume of blood received in culture bottles   Culture   Final    NO GROWTH 4 DAYS Performed at [facility], [address] [place], [place] [address]    Report Status PENDING  Incomplete  Resp Panel by RT-PCR (Flu A&B, Covid) Nasopharyngeal Swab     Status: None   Collection Time: 12/15/20  9:10 AM   Specimen: Nasopharyngeal Swab; Nasopharyngeal(NP) swabs in vial transport medium  Result Value Ref Range Status   SARS Coronavirus 2 by RT PCR NEGATIVE NEGATIVE Final    Comment: (NOTE) SARS-CoV-2 target nucleic acids are NOT DETECTED.  The SARS-CoV-2 RNA is generally detectable in upper respiratory specimens during the acute phase of infection. The lowest concentration of SARS-CoV-2 viral copies this assay can detect is 138 copies/mL. A negative result does not preclude SARS-Cov-2 infection and should not be used as the sole basis for treatment  or other patient management decisions. A negative result may occur with  improper specimen  collection/handling, submission of specimen other than nasopharyngeal swab, presence of viral mutation(s) within the areas targeted by this assay, and inadequate number of viral copies(<138 copies/mL). A negative result must be combined with clinical observations, patient history, and epidemiological information. The expected result is Negative.  Fact Sheet for Patients:  EntrepreneurPulse.com.au  Fact Sheet for Healthcare Providers:  IncredibleEmployment.be  This test is no t yet approved or cleared by the [place] FDA and  has been authorized for detection and/or diagnosis of SARS-CoV-2 by FDA under an Emergency Use Authorization (EUA). This EUA will remain  in effect (meaning this test can be used) for the duration of the COVID-19 declaration under Section 564(b)(1) of the Act, 21 U.S.C.section 360bbb-3(b)(1), unless the authorization is terminated  or revoked sooner.       Influenza A by PCR NEGATIVE NEGATIVE Final   Influenza B by PCR NEGATIVE NEGATIVE Final    Comment: (NOTE) The Xpert Xpress SARS-CoV-2/FLU/RSV plus assay is intended as an aid in the diagnosis of influenza from Nasopharyngeal swab specimens and should not be used as a sole basis for treatment. Nasal washings and aspirates are unacceptable for Xpert Xpress SARS-CoV-2/FLU/RSV testing.  Fact Sheet for Patients: EntrepreneurPulse.com.au  Fact Sheet for Healthcare Providers: IncredibleEmployment.be  This test is not yet approved or cleared by the [place] FDA and has been authorized for detection and/or diagnosis of SARS-CoV-2 by FDA under an Emergency Use Authorization (EUA). This EUA will remain in effect (meaning this test can be used) for the duration of the COVID-19 declaration under Section 564(b)(1) of the Act, 21 U.S.C. section 360bbb-3(b)(1), unless the authorization is terminated or revoked.  Performed at [facility], [address] [place],  [address]      Patient was seen and examined on the day of discharge and was found to be in stable condition. Time coordinating discharge: 40 minutes including assessment and coordination of care, as well as examination of the patient.   SIGNED:  Dessa Phi, DO [facility] 12/15/2020, 12:49 PM

## 2020-12-15 NOTE — Progress Notes (Signed)
Patient able to sit up several hours in the recliner

## 2020-12-15 NOTE — Progress Notes (Signed)
Occupational Therapy Treatment Patient Details Name: Douglas Clark MRN: [id number] DOB: December 12, 1940 Today's Date: 12/15/2020   History of present illness presented to ER secondary to fall from bed in home environment; admitted for management of COPD exacerbation.  L hip imaging negative for acute injury.   OT comments  Upon entering the room, pt supine in bed with wife present in the room and agreeable to OT intervention. Pt performing bed mobility with increased time and min A for weight shift to get B LEs to EOB. Pt engaged in brushing teeth, washing face, and apply lotion to skin with cuing for midline orientation. Pt with R lateral lean with tasks and use of mirror for visual feed back as well. Pt performing lateral scoots along bed towards the R with min A. Pt performing sit >supine with min guard and repositioned for comfort in bed. All needs within reach.    Recommendations for follow up therapy are one component of a multi-disciplinary discharge planning process, led by the attending physician.  Recommendations may be updated based on patient status, additional functional criteria and insurance authorization.    Follow Up Recommendations  SNF;Supervision/Assistance - 24 hour    Equipment Recommendations  Other (comment) (defer to next venue of care)       Precautions / Restrictions Precautions Precautions: Fall       Mobility Bed Mobility Overal bed mobility: Needs Assistance Bed Mobility: Supine to Sit;Sit to Supine     Supine to sit: Min assist Sit to supine: Min assist   General bed mobility comments: min A for trunk support with bed mobility and min cuing for technique    Transfers Overall transfer level: Needs assistance Equipment used: None Transfers: Lateral/Scoot Transfers          Lateral/Scoot Transfers: Min assist General transfer comment: lateral scoots along the EOB towards HOB with min A and cuing    Balance Overall balance assessment: Needs  assistance Sitting-balance support: No upper extremity supported;Feet supported Sitting balance-Leahy Scale: Fair Sitting balance - Comments: cuing for leaning and mirror for visual feedback Postural control: Right lateral lean                                 ADL either performed or assessed with clinical judgement   ADL Overall ADL's : Needs assistance/impaired     Grooming: Wash/dry hands;Wash/dry face;Sitting;Set up;Supervision/safety                                       Vision Patient Visual Report: No change from baseline            Cognition Arousal/Alertness: Awake/alert Behavior During Therapy: WFL for tasks assessed/performed Overall Cognitive Status: History of cognitive impairments - at baseline                                 General Comments: Pt remains cooperative and pleasant. He needs increased time to process and min cuing for safety awareness.                   Pertinent Vitals/ Pain       Pain Assessment: No/denies pain   Frequency  Min 2X/week        Progress Toward Goals  OT Goals(current goals can now  be found in the care plan section)  Progress towards OT goals: Progressing toward goals  Acute Rehab OT Goals Patient Stated Goal: to feel better OT Goal Formulation: With patient/family Time For Goal Achievement: 12/26/20 Potential to Achieve Goals: Good  Plan Discharge plan remains appropriate;Frequency remains appropriate       AM-PAC OT "6 Clicks" Daily Activity     Outcome Measure   Help from another person eating meals?: None Help from another person taking care of personal grooming?: A Little Help from another person toileting, which includes using toliet, bedpan, or urinal?: A Lot Help from another person bathing (including washing, rinsing, drying)?: A Lot Help from another person to put on and taking off regular upper body clothing?: A Little Help from another person to put on  and taking off regular lower body clothing?: A Lot 6 Click Score: 16    End of Session    OT Visit Diagnosis: Unsteadiness on feet (R26.81);Muscle weakness (generalized) (M62.81);History of falling (Z91.81);Repeated falls (R29.6)   Activity Tolerance Patient tolerated treatment well   Patient Left in bed;with call bell/phone within reach;with bed alarm set;with family/visitor present   Nurse Communication Mobility status        Time: 0935-1003 OT Time Calculation (min): 28 min  Charges: OT General Charges $OT Visit: 1 Visit OT Treatments $Self Care/Home Management : 23-37 mins  Darleen Crocker, MS, OTR/L , CBIS ascom [phone]  12/15/20, 12:59 PM

## 2020-12-15 NOTE — Progress Notes (Signed)
Physical Therapy Treatment Patient Details Name: Douglas Clark MRN: [id number] DOB: 11/16/1940 Today's Date: 12/15/2020   History of Present Illness presented to ER secondary to fall from bed in home environment; admitted for management of COPD exacerbation.  L hip imaging negative for acute injury.    PT Comments    Pt received laying semi-fowler's position in bed with spouse present. Family education provided on types of transfers that may be beneficial for patient and spouse. Pt completed stand pivot transfers x 2 with modA towards L and R. Pt is unable to come to full upright standing however, is able to pivot feet with verbal cueing. Trialed usage of transfer board for lateral scooting transfer to recliner with chuck pad with good success. Pt and spouse would benefit from further education regarding transfer options while at SNF to increase patient's independence and reduce caregiver burden. Pt and spouse educated on seated exercises to maintain LE strength. Pt will continue to benefit from skilled PT services during hospitalization to improve functional mobility, reduce fall risk, and decrease caregiver burden.    Recommendations for follow up therapy are one component of a multi-disciplinary discharge planning process, led by the attending physician.  Recommendations may be updated based on patient status, additional functional criteria and insurance authorization.  Follow Up Recommendations  SNF (may benefit from consideration of LTC if spouse unable to provide care after rehab)     Equipment Recommendations       Recommendations for Other Services       Precautions / Restrictions Precautions Precautions: Fall Restrictions Weight Bearing Restrictions: No     Mobility  Bed Mobility Overal bed mobility: Needs Assistance Bed Mobility: Supine to Sit     Supine to sit: Min assist Sit to supine: Min assist   General bed mobility comments: minA at trunk for upright  positioning. Verbal cueing for movement of B LEs off EOB. MinA for scooting towards EOB with cues for increased UE involvement    Transfers Overall transfer level: Needs assistance Equipment used: 1 person hand held assist Transfers: Stand Pivot Transfers   Stand pivot transfers: Mod assist      Lateral/Scoot Transfers: With slide board;From elevated surface;Min assist General transfer comment: Verbal cues for foot placement and anterior trunk lean to assist with standing. Pt unable to fully reach upright position and remained with B knees bent. Pt able to pivot his feet with verbal cues.  Ambulation/Gait             General Gait Details: WC mobility at baseline   Stairs             Wheelchair Mobility    Modified Rankin (Stroke Patients Only)       Balance Overall balance assessment: Needs assistance Sitting-balance support: Feet supported;Bilateral upper extremity supported Sitting balance-Leahy Scale: Fair Sitting balance - Comments: some tendency to lean back however able to correct with verbal cues and CGA Postural control: Right lateral lean Standing balance support: Bilateral upper extremity supported   Standing balance comment: Unable to achieve fully upright position and required UE support on PT's UE throughout transfer                            Cognition Arousal/Alertness: Awake/alert Behavior During Therapy: [facility] for tasks assessed/performed Overall Cognitive Status: History of cognitive impairments - at baseline  General Comments: Cooperative and pleasant. Frequently directs attention to wife for reassurance of his answers      Exercises Other Exercises Other Exercises: Transfers: Pt completed transfers x 4. Stand pivot transfer towards L to chair with modA and verbal cueing for trunk lean. Pt and spouse attempted transfer together and pt and spouse demonstrating poor body mechanics and  decreased safety awareness with transfer requiring pt to sit back in recliner chair. Pt complete squat pivot transfer back to bed towards R with PT. Pt and spouse educated on alternative transfer methods including usage of sliding board. Pt completed one sliding board transfer with chuck pad over surface to protect pt's skin. Pt able to complete transfer with good usage of UEs + verbal cues and with B knee block to prevent anterior translation. Spouse reporting that she does not believe their home would be conducive to these types of transfer nor does she believe a lift will fit into their home.    General Comments        Pertinent Vitals/Pain Pain Assessment: Faces Faces Pain Scale: Hurts a little bit Pain Location: L hip Pain Descriptors / Indicators: Sore Pain Intervention(s): Monitored during session;Repositioned    Home Living                      Prior Function            PT Goals (current goals can now be found in the care plan section) Acute Rehab PT Goals Patient Stated Goal: to increased LE strength to assist wife with transfers PT Goal Formulation: With patient/family Time For Goal Achievement: 12/26/20 Potential to Achieve Goals: Fair Progress towards PT goals: Progressing toward goals    Frequency    Min 2X/week      PT Plan Current plan remains appropriate    Co-evaluation              AM-PAC PT "6 Clicks" Mobility   Outcome Measure  Help needed turning from your back to your side while in a flat bed without using bedrails?: A Little Help needed moving from lying on your back to sitting on the side of a flat bed without using bedrails?: A Little Help needed moving to and from a bed to a chair (including a wheelchair)?: A Lot Help needed standing up from a chair using your arms (e.g., wheelchair or bedside chair)?: A Lot Help needed to walk in hospital room?: Total Help needed climbing 3-5 steps with a railing? : Total 6 Click Score: 12     End of Session Equipment Utilized During Treatment: Gait belt Activity Tolerance: Patient tolerated treatment well Patient left: in chair;with call bell/phone within reach;with chair alarm set;with family/visitor present Nurse Communication: Mobility status PT Visit Diagnosis: Muscle weakness (generalized) (M62.81);Repeated falls (R29.6)     Time: 2947-6546 PT Time Calculation (min) (ACUTE ONLY): 35 min  Charges:  $Therapeutic Activity: 23-37 mins                     Andrey Campanile, SPT    Andrey Campanile 12/15/2020, 1:33 PM

## 2020-12-15 NOTE — TOC Progression Note (Signed)
Transition of Care [facility]) - Progression Note    Patient Details  Name: Douglas Clark MRN: [id number] Date of Birth: 06-04-1940  Transition of Care [facility]) CM/SW Contact  Douglas Sessions, RN Phone Number: 12/15/2020, 4:19 PM  Clinical Narrative:    Insurance has offered peer to peer.  MD declines peer to peer as it was felt patient was at his baseline and would benefit returning home with resumption of hospice  [facility] notified TOC of denial.  Rep states there is no form that needs to be provided to member. Provided wife Douglas Clark with number to appeal line should she like to appeal.  [name] confirms that she will will be filing an appeal.  MD notified.    [name] with [facility] notified that plan will be to return home with hospice.    Wife in agreement for referral to be made to care patrol at discharge to assist with private pay caregivers.  Referral made to [facility] with Care patrol     Expected Discharge Plan: [place] Barriers to Discharge: Continued Medical Work up  Expected Discharge Plan and Services Expected Discharge Plan: [place]   Discharge Planning Services: CM Consult   Living arrangements for the past 2 months: Single Family Home                                       Social Determinants of Health (SDOH) Interventions    Readmission Risk Interventions No flowsheet data found.

## 2020-12-15 NOTE — Progress Notes (Addendum)
Nurse pulled lactulose to give pt. For constipation and patient refused stating "I'll wait until [name] is here tomorrow". Nurse explained to patient that staff could help him when he has to go and he still insisted on waiting until tomorrow when wife present.

## 2020-12-15 NOTE — TOC Progression Note (Signed)
Transition of Care [facility]) - Progression Note    Patient Details  Name: Douglas Clark MRN: [id number] Date of Birth: 10/26/1940  Transition of Care [facility]) CM/SW Contact  Beverly Sessions, RN Phone Number: 12/15/2020, 10:15 AM  Clinical Narrative:     Presented bed offers to wife.  [facility]  [facility] [place] [facility] notified  Auth started in navi portal.  Requested for therapy to see again today  Expected Discharge Plan: Skilled Nursing Facility Barriers to Discharge: Continued Medical Work up  Expected Discharge Plan and Services Expected Discharge Plan: [place]   Discharge Planning Services: CM Consult   Living arrangements for the past 2 months: Single Family Home                                       Social Determinants of Health (SDOH) Interventions    Readmission Risk Interventions No flowsheet data found.

## 2020-12-16 DIAGNOSIS — J441 Chronic obstructive pulmonary disease with (acute) exacerbation: Secondary | ICD-10-CM

## 2020-12-16 LAB — CULTURE, BLOOD (ROUTINE X 2)
Culture: NO GROWTH
Culture: NO GROWTH
Special Requests: ADEQUATE

## 2020-12-16 NOTE — Plan of Care (Signed)
  Problem: Education: Goal: Knowledge of General Education information will improve Description: Including pain rating scale, medication(s)/side effects and non-pharmacologic comfort measures Outcome: Progressing   Problem: Health Behavior/Discharge Planning: Goal: Ability to manage health-related needs will improve Outcome: Progressing   Problem: Clinical Measurements: Goal: Ability to maintain clinical measurements within normal limits will improve Outcome: Progressing Goal: Will remain free from infection Outcome: Progressing Goal: Diagnostic test results will improve Outcome: Progressing Goal: Respiratory complications will improve Outcome: Progressing Goal: Cardiovascular complication will be avoided Outcome: Progressing   Problem: Activity: Goal: Risk for activity intolerance will decrease Outcome: Not Progressing   Problem: Nutrition: Goal: Adequate nutrition will be maintained Outcome: Progressing   Problem: Coping: Goal: Level of anxiety will decrease Outcome: Progressing   Problem: Pain Managment: Goal: General experience of comfort will improve Outcome: Progressing   Problem: Safety: Goal: Ability to remain free from injury will improve Outcome: Progressing   Problem: Skin Integrity: Goal: Risk for impaired skin integrity will decrease Outcome: Progressing   

## 2020-12-16 NOTE — TOC Progression Note (Signed)
Transition of Care [facility]) - Progression Note    Patient Details  Name: INRI SOBIESKI MRN: [id number] Date of Birth: 08/24/1940  Transition of Care [facility]) CM/SW Contact  Beverly Sessions, RN Phone Number: 12/16/2020, 3:43 PM  Clinical Narrative:     Per wife she received call from insurance 30 minutes ago.  Appeal is still in process   Expected Discharge Plan: [place] Barriers to Discharge: Continued Medical Work up  Expected Discharge Plan and Services Expected Discharge Plan: [place]   Discharge Planning Services: CM Consult   Living arrangements for the past 2 months: Single Family Home                                       Social Determinants of Health (SDOH) Interventions    Readmission Risk Interventions No flowsheet data found.

## 2020-12-16 NOTE — Progress Notes (Signed)
  PROGRESS NOTE  No new issues.  Patient was declined by insurance for SNF placement.  Patient is custodial care level at this point as he is wheelchair-bound at baseline, requires wife to assist him for transfers at baseline.  He was enrolled in home hospice prior to admission.  Yesterday's PT evaluation revealed that he could complete stand pivot transfers with moderate assist.  He seems to be back to his baseline level.  Discussed with wife regarding the fact that patient is not a good candidate for SNF as he is deemed custodial care, would continue home hospice care -- they are able to provide RN and CNA during the week.  If additional care is necessary, they will have to find private pay caregiver for more hours or look into long-term care facility for patient.  Wife is currently in the appeal process.   Dessa Phi, DO [facility] 12/16/2020, 10:38 AM  Available via Epic secure chat 7am-7pm After these hours, please refer to coverage provider listed on [email or web address]

## 2020-12-17 LAB — EXPECTORATED SPUTUM ASSESSMENT W GRAM STAIN, RFLX TO RESP C

## 2020-12-17 NOTE — TOC Progression Note (Signed)
Transition of Care [facility]) - Progression Note    Patient Details  Name: Douglas Clark MRN: [id number] Date of Birth: 1940/08/16  Transition of Care [facility]) CM/SW Contact  Beverly Sessions, RN Phone Number: 12/17/2020, 9:45 AM  Clinical Narrative:    [name] with [name] And [name] pre service center.  Appeal still pending    Expected Discharge Plan: Skilled Nursing Facility Barriers to Discharge: Continued Medical Work up  Expected Discharge Plan and Services Expected Discharge Plan: [place]   Discharge Planning Services: CM Consult   Living arrangements for the past 2 months: Single Family Home                                       Social Determinants of Health (SDOH) Interventions    Readmission Risk Interventions No flowsheet data found.

## 2020-12-17 NOTE — Progress Notes (Signed)
This is a no charge note.  Patient discharged two days ago, there is an appeal of the SNF coverage in process.  He has no complaints.  Wife present at bedside, she denies new developments.  No change to Dr. Jeannine Kitten plan.

## 2020-12-17 NOTE — Progress Notes (Signed)
Occupational Therapy Treatment Patient Details Name: Douglas Clark MRN: [id number] DOB: 02-16-1941 Today's Date: 12/17/2020   History of present illness presented to ER secondary to fall from bed in home environment; admitted for management of COPD exacerbation.  L hip imaging negative for acute injury.   OT comments  Upon entering the room, pt supine in bed and reports fatigue. Pt is agreeable to OT intervention from bed level with focus on B UE strengthening exercises. OT demonstrated exercises and pt returns demonstrations with min cuing for proper technique. Use of yellow resistive theraband and pt performing 3 sets of 10 chest pulls, shoulder elevation, shoulder diagonals, and bicep curls with rest breaks as needed. Pt requesting to rest and is repositioned in bed with all needs within reach.    Recommendations for follow up therapy are one component of a multi-disciplinary discharge planning process, led by the attending physician.  Recommendations may be updated based on patient status, additional functional criteria and insurance authorization.    Follow Up Recommendations  SNF;Supervision/Assistance - 24 hour    Equipment Recommendations  Other (comment) (defer to next venue of care)       Precautions / Restrictions Precautions Precautions: Fall              ADL either performed or assessed with clinical judgement     Vision Patient Visual Report: No change from baseline            Cognition Arousal/Alertness: Awake/alert Behavior During Therapy: WFL for tasks assessed/performed Overall Cognitive Status: History of cognitive impairments - at baseline                                 General Comments: Pt is cooperative and pleasant but oriented to self and often confused when asked orientation questions.                   Pertinent Vitals/ Pain       Pain Assessment: 0-10 Pain Score: 0-No pain         Frequency  Min 2X/week         Progress Toward Goals  OT Goals(current goals can now be found in the care plan section)  Progress towards OT goals: Progressing toward goals  Acute Rehab OT Goals Patient Stated Goal: to increased LE strength to assist wife with transfers OT Goal Formulation: With patient/family Time For Goal Achievement: 12/26/20 Potential to Achieve Goals: Good  Plan Discharge plan remains appropriate;Frequency remains appropriate       AM-PAC OT "6 Clicks" Daily Activity     Outcome Measure   Help from another person eating meals?: None Help from another person taking care of personal grooming?: A Little Help from another person toileting, which includes using toliet, bedpan, or urinal?: A Lot Help from another person bathing (including washing, rinsing, drying)?: A Lot Help from another person to put on and taking off regular upper body clothing?: A Little Help from another person to put on and taking off regular lower body clothing?: A Lot 6 Click Score: 16    End of Session    OT Visit Diagnosis: Unsteadiness on feet (R26.81);Muscle weakness (generalized) (M62.81);History of falling (Z91.81);Repeated falls (R29.6)   Activity Tolerance Patient tolerated treatment well   Patient Left in bed;with call bell/phone within reach;with bed alarm set;with family/visitor present   Nurse Communication Mobility status        Time: 5809-9833 OT  Time Calculation (min): 24 min  Charges: OT General Charges $OT Visit: 1 Visit OT Treatments $Therapeutic Exercise: 23-37 mins  Darleen Crocker, MS, OTR/L , CBIS ascom [phone]  12/17/20, 4:26 PM

## 2020-12-18 NOTE — Progress Notes (Signed)
Physical Therapy Treatment Patient Details Name: Douglas Clark MRN: [id number] DOB: [birth year] Today's Date: 12/18/2020   History of Present Illness presented to ER secondary to fall from bed in home environment; admitted for management of COPD exacerbation.  L hip imaging negative for acute injury.    PT Comments    Pt was long sitting in bed upon arriving with spouse/caregiver at bedside. He is alert and able to follow command throughout session without difficulty. Most of session spent simulating home environment and performing transfers out/in bed. He was able to stand to RW and take steps to recliner with spouse assist only. Rest in recliner for a few minutes prior to standing and taking steps back to EOB. Pt has personal gait belt and RW at home. Author highly recommends use of both for improved safety at home. Pt/spouse planning to DC home from acute hospital. Would greatly benefit from continued exercise and therapy to progress to PLOF.      Recommendations for follow up therapy are one component of a multi-disciplinary discharge planning process, led by the attending physician.  Recommendations may be updated based on patient status, additional functional criteria and insurance authorization.  Follow Up Recommendations  SNF     Equipment Recommendations  Other (comment) (per pt/cargiver. " we have all equipment we need already.")       Precautions / Restrictions Precautions Precautions: Fall Restrictions Weight Bearing Restrictions: No     Mobility  Bed Mobility Overal bed mobility: Needs Assistance Bed Mobility: Supine to Sit     Supine to sit: Min assist Sit to supine: Min assist        Transfers Overall transfer level: Needs assistance Equipment used: Rolling walker (2 wheeled) Transfers: Sit to/from [facility] Sit to Stand: Min guard;Min assist         General transfer comment: pt stood 2 x EOB with CGA and 1 x from recliner with min  assist. Caregiver performed 2nd and 3rd transfers without assistance from [employer]  Ambulation/Gait Ambulation/Gait assistance: Min assist Gait Distance (Feet): 3 Feet Assistive device: Rolling walker (2 wheeled) Gait Pattern/deviations: Step-to pattern;Trunk flexed;Shuffle;Decreased stride length Gait velocity: decreased   General Gait Details: pt was able to take 3 steps from EOB to recliner then 3 steps back to EOB from recliner. simulated transfers to/from w/c      Balance Overall balance assessment: Needs assistance Sitting-balance support: Feet supported;Bilateral upper extremity supported Sitting balance-Leahy Scale: Good Sitting balance - Comments: no LOB sitting EOB   Standing balance support: Bilateral upper extremity supported;During functional activity Standing balance-Leahy Scale: Poor Standing balance comment: reliant on UE support. discussed importance of use of RW at all time during transfers      Cognition Arousal/Alertness: Awake/alert Behavior During Therapy: WFL for tasks assessed/performed Overall Cognitive Status: History of cognitive impairments - at baseline          General Comments: Pt was alert and cooperatiove throughout. caregiver likes to answer questions for him. he was able to consistently follow commands without issues             Pertinent Vitals/Pain Pain Assessment: No/denies pain Pain Score: 0-No pain     PT Goals (current goals can now be found in the care plan section) Acute Rehab PT Goals Patient Stated Goal: get better and return home Progress towards PT goals: Progressing toward goals    Frequency    Min 2X/week      PT Plan Current plan remains appropriate  AM-PAC PT "6 Clicks" Mobility   Outcome Measure  Help needed turning from your back to your side while in a flat bed without using bedrails?: A Little Help needed moving from lying on your back to sitting on the side of a flat bed without using bedrails?:  A Little Help needed moving to and from a bed to a chair (including a wheelchair)?: A Little Help needed standing up from a chair using your arms (e.g., wheelchair or bedside chair)?: A Little Help needed to walk in hospital room?: A Lot Help needed climbing 3-5 steps with a railing? : Total 6 Click Score: 15    End of Session Equipment Utilized During Treatment: Gait belt Activity Tolerance: Patient tolerated treatment well Patient left: in bed;with call bell/phone within reach;with bed alarm set;with family/visitor present Nurse Communication: Mobility status PT Visit Diagnosis: Muscle weakness (generalized) (M62.81);Repeated falls (R29.6)     Time: 1030-1055 PT Time Calculation (min) (ACUTE ONLY): 25 min  Charges:  $Therapeutic Activity: 23-37 mins                     Julaine Fusi PTA 12/18/20, 1:06 PM

## 2020-12-18 NOTE — Progress Notes (Signed)
Patient discharged three days ago, nursing staff unable to discharge patient home while appeal pending for acute rehab.  Family have informed us today that they would like to take him home.  I spoke with the patient this morning, he reported feeling well, at baseline, without new complaints.  The plan of care as outlined by Dr. Maylene Roes remains valid.

## 2020-12-18 NOTE — Progress Notes (Signed)
AVS and printed prescriptions given to patient and wife/

## 2020-12-18 NOTE — Plan of Care (Signed)

## 2020-12-18 NOTE — TOC Transition Note (Signed)
Transition of Care [facility]) - CM/SW Discharge Note   Patient Details  Name: Douglas Clark MRN: [id number] Date of Birth: 02-14-1941  [facility] CM/SW Contact:  Beverly Sessions, RN Phone Number: 12/18/2020, 12:16 PM   Clinical Narrative:     [name] with Hart Carwin at [facility] [phone] Reference number s -[id number]  Per rep case is still pending, and they have till tomorrow at 322 pm central time to make a decision   Spoke with wife and notified.  She states she has decided to take patient back home with [facility] hosp[facility] and not wait for determination.  MD to discharge today.  [name] with [facility] hosp[facility] notified.  Wife to transport   Final next level of care: Home w Hospice Care Barriers to Discharge: Barriers Resolved   Patient Goals and CMS Choice        Discharge Placement                       Discharge Plan and Services   Discharge Planning Services: CM Consult                                 Social Determinants of Health (SDOH) Interventions     Readmission Risk Interventions No flowsheet data found.

## 2021-01-23 ENCOUNTER — Encounter: Payer: Self-pay | Admitting: Radiation Oncology

## 2021-01-27 ENCOUNTER — Encounter: Payer: Self-pay | Admitting: Radiation Oncology

## 2021-03-11 ENCOUNTER — Encounter: Payer: Self-pay | Admitting: Radiation Oncology

## 2021-05-18 ENCOUNTER — Encounter: Payer: Self-pay | Admitting: Radiation Oncology

## 2021-05-20 DEATH — deceased
